# Patient Record
Sex: Female | Born: 1990 | Race: Black or African American | Hispanic: No | Marital: Single | Smoking: Former smoker
Health system: Southern US, Community
[De-identification: ages and names within clinical notes are randomized; demographics above are authoritative.]

## PROBLEM LIST (undated history)

## (undated) DIAGNOSIS — D649 Anemia, unspecified: Secondary | ICD-10-CM

## (undated) DIAGNOSIS — G4730 Sleep apnea, unspecified: Secondary | ICD-10-CM

## (undated) DIAGNOSIS — K219 Gastro-esophageal reflux disease without esophagitis: Secondary | ICD-10-CM

## (undated) DIAGNOSIS — K5090 Crohn's disease, unspecified, without complications: Secondary | ICD-10-CM

## (undated) HISTORY — PX: OTHER SURGICAL HISTORY: SHX169

## (undated) HISTORY — DX: Crohn's disease, unspecified, without complications: K50.90

## (undated) HISTORY — PX: APPENDECTOMY: SHX54

## (undated) HISTORY — PX: TONSILLECTOMY: SUR1361

---

## 2015-05-22 HISTORY — PX: EXPLORATORY LAPAROTOMY: SUR591

## 2015-07-22 HISTORY — PX: COLONOSCOPY: SHX174

## 2017-03-12 ENCOUNTER — Encounter: Payer: Self-pay | Admitting: Gastroenterology

## 2017-05-02 ENCOUNTER — Encounter: Payer: Self-pay | Admitting: Gastroenterology

## 2017-05-02 ENCOUNTER — Ambulatory Visit (INDEPENDENT_AMBULATORY_CARE_PROVIDER_SITE_OTHER): Payer: 59 | Admitting: Gastroenterology

## 2017-05-02 DIAGNOSIS — K5080 Crohn's disease of both small and large intestine without complications: Secondary | ICD-10-CM | POA: Diagnosis not present

## 2017-05-02 DIAGNOSIS — K5000 Crohn's disease of small intestine without complications: Secondary | ICD-10-CM | POA: Insufficient documentation

## 2017-05-02 NOTE — Progress Notes (Addendum)
REVIEWED-NO ADDITIONAL RECOMMENDATIONS.     Primary Care Physician:  Kirstie Peri, MD Primary Gastroenterologist:  Dr. Darrick Penna   Chief Complaint  Patient presents with  . Crohn's Disease    abd pain, diarrhea    HPI:   Shannon Bentley (would like to be called "[name]") is a 27 y.o. female presenting today at the request of Dr. Sherryll Burger to establish care. She underwent exploratory laparoscopy converted to open laparotomy, appendectomy, April 2017 by Dr Marcha Solders at [place]. Found to have large inflammatory mass at cecum, appendix unrecognizable in phlegmonous mass, right hemicolectomy performed with ileocolic anastamosis. path with crohn's disease involving both ileum and cecum. She established care with Dr. Teena Dunk. Underwent colonoscopy June 2017: evidence of prior surgical anastomosis, multiple biopsies. 2 small ulcers at surgical anastomosis. TI and remaining colon normal, without evidence of IBD. Path: acute colitis with reactive changes. Features of Crohn's disease not specifically identified. Inflammatory changes were non-specific. DDx includes infection, drug effects, self-limited colitis.    Was taking Imuran 75 BID, made her feel high. Stopped around November. States she was given some type of IV medication at [facility] (?Remicade) but made her feel awful. Didn't like taking this because it made her feel high. CT recently at [facility] around Nov/Dec where she was told she had some inflammation. Had been dealing with abdominal pain and gas from age [age] but was never able to figure out what was wrong. No rectal bleeding or diarrhea at that time. Not on Imuran any longer.   No rectal bleeding. No abdominal pain. No diarrhea. Denies NSAIDs.   CT Dec 2018: descending colon decompressed but suggestion of mild wall thickening, unable to exclude colitis.   Past Medical History:  Diagnosis Date  . Crohn's disease [facility])     Past Surgical History:  Procedure Laterality Date  . APPENDECTOMY    .  COLONOSCOPY  07/2015   Dr. Teena Dunk: evidence of prior surgical anastomosis, multiple biopsies. 2 small ulcers at anastomosis. TI and remaining colon normal, without evidence of IBD. Path with acute colitis with reactive changes, features of Crohn's not specifically identified. non-specific. differentials including infection, drug effects, self-limited colitis  . EXPLORATORY LAPAROTOMY  05/2015   Dr. 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 path with crohn's disease involving both ileum and cecum  . tonsils and adenoids      Current Outpatient Medications  Medication Sig Dispense Refill  . amoxicillin-clavulanate (AUGMENTIN) 500-125 MG tablet Take 1 tablet by mouth 2 (two) times daily.    . benzonatate (TESSALON) 100 MG capsule Take by mouth 3 (three) times daily.    . clobetasol cream (TEMOVATE) 0.05 % Apply 1 application topically 2 (two) times daily.    . drospirenone-ethinyl estradiol (YASMIN,ZARAH,SYEDA) 3-0.03 MG tablet Take 1 tablet by mouth daily.    Marland Kitchen esomeprazole (NEXIUM) 20 MG capsule Take 20 mg by mouth 2 (two) times daily.    . folic acid (FOLVITE) 800 MCG tablet Take 400 mcg by mouth daily.    . hydrocortisone cream 0.5 % Apply 1 application topically as needed for itching.    . levocetirizine (XYZAL) 5 MG tablet Take 2.5 mg by mouth every evening.    . Multiple Vitamins-Minerals (AIRBORNE PO) Take by mouth daily.    Marland Kitchen Phenyleph-Doxylamine-DM-APAP (ALKA-SELTZER PLS NIGHT CLD/FLU PO) Take 2 capsules by mouth every 4 (four) hours.    . Phenyleph-Doxylamine-DM-APAP (NYQUIL SEVERE COLD/FLU) 5-6.25-10-325 MG/15ML LIQD Take by mouth at bedtime.    Marland Kitchen  Prenatal Vit-Fe Fumarate-FA (PRENATAL VITAMIN PO) Take by mouth daily.     No current facility-administered medications for this visit.     Allergies as of 05/02/2017 - Review Complete 05/02/2017  Allergen Reaction Noted  . Bactrim  [sulfamethoxazole-trimethoprim] Hives 05/02/2017  . Benadryl [diphenhydramine] Hives 05/02/2017  . Dilaudid [hydromorphone hcl] Hives 05/02/2017    Family History  Problem Relation Age of Onset  . Diabetes Mother   . Hypertension Mother   . Diabetes Father   . Hypertension Father   . Colon cancer Neg Hx   . Colon polyps Neg Hx   . Inflammatory bowel disease Neg Hx     Social History   Socioeconomic History  . Marital status: Single    Spouse name: Not on file  . Number of children: Not on file  . Years of education: Not on file  . Highest education level: Not on file  Social Needs  . Financial resource strain: Not on file  . Food insecurity - worry: Not on file  . Food insecurity - inability: Not on file  . Transportation needs - medical: Not on file  . Transportation needs - non-medical: Not on file  Occupational History  . Not on file  Tobacco Use  . Smoking status: Former Smoker    Types: E-cigarettes  . Smokeless tobacco: Never Used  . Tobacco comment: vaped in past  Substance and Sexual Activity  . Alcohol use: Yes    Comment: rare  . Drug use: No  . Sexual activity: Yes    Birth control/protection: Pill  Other Topics Concern  . Not on file  Social History Narrative  . Not on file    Review of Systems: Gen: Denies any fever, chills, fatigue, weight loss, lack of appetite.  CV: Denies chest pain, heart palpitations, peripheral edema, syncope.  Resp: Denies shortness of breath at rest or with exertion. Denies wheezing or cough.  GI: see HPI  GU : Denies urinary burning, urinary frequency, urinary hesitancy MS: Denies joint pain, muscle weakness, cramps, or limitation of movement.  Derm: Denies rash, itching, dry skin Psych: Denies depression, anxiety, memory loss, and confusion Heme: Denies bruising, bleeding, and enlarged lymph nodes.  Physical Exam: BP 136/82   Pulse 97   Temp (!) 97.2 F (36.2 C) (Oral)   Ht 5\' 1"  (1.549 m)   Wt 259 lb 3.2 oz  (117.6 kg)   BMI 48.98 kg/m  General:   Alert and oriented. Pleasant and cooperative. Well-nourished and well-developed.  Head:  Normocephalic and atraumatic. Eyes:  Without icterus, sclera clear and conjunctiva pink.  Ears:  Normal auditory acuity. Nose:  No deformity, discharge,  or lesions. Mouth:  No deformity or lesions, oral mucosa pink.  Lungs:  Clear to auscultation bilaterally. No wheezes, rales, or rhonchi. No distress.  Heart:  S1, S2 present without murmurs appreciated.  Abdomen:  +BS, soft, non-tender and non-distended. No HSM noted. No guarding or rebound. No masses appreciated.  Rectal:  Deferred  Msk:  Symmetrical without gross deformities. Normal posture. Extremities:  Without  edema. Neurologic:  Alert and  oriented x4 Psych:  Alert and cooperative. Normal mood and affect.

## 2017-05-02 NOTE — Patient Instructions (Signed)
I am requesting all of the procedure notes and hospital records.   I will call you with the next step. Likely you will need an updated colonoscopy.  Further recommendations to follow!  It was a pleasure to see you today. I strive to create trusting relationships with patients to provide genuine, compassionate, and quality care. I value your feedback. If you receive a survey regarding your visit,  I greatly appreciate you taking time to fill this out.   Annitta Needs, PhD, ANP-BC [facility]

## 2017-05-07 ENCOUNTER — Telehealth: Payer: Self-pay | Admitting: Gastroenterology

## 2017-05-07 NOTE — Telephone Encounter (Signed)
Darl Pikes: I got some records from [facility], but I am missing the operative note from appendectomy and bowel resection, along with path. Can you have them send ASAP?

## 2017-05-08 NOTE — Telephone Encounter (Signed)
Requested records ASAP from [facility]

## 2017-05-09 ENCOUNTER — Encounter: Payer: Self-pay | Admitting: Gastroenterology

## 2017-05-09 ENCOUNTER — Telehealth: Payer: Self-pay | Admitting: Gastroenterology

## 2017-05-09 NOTE — Progress Notes (Signed)
cc'ed to pcp °

## 2017-05-09 NOTE — Telephone Encounter (Signed)
Tried to call pt, no answer, LMOVM. 

## 2017-05-09 NOTE — Telephone Encounter (Signed)
Please arrange colonoscopy with Propofol, Dr. Oneida Alar, due to history of ileocolonic Crohn's disease. Needs pregnancy test prior. Thanks!

## 2017-05-09 NOTE — Assessment & Plan Note (Signed)
27 year old female presenting to establish care. Underwent exploratory laparotomy with right hemicolectomy in April 2017 at [facility] by Dr. Marcha Solders, after CT findings of acute appendicitis. Path with Crohn's disease of both ileum and cecum. She underwent colonoscopy June 2017 by Dr. Teena Dunk that revealed 2 small ulcers at surgical anastomosis, not consistent with Crohn's disease and favoring differentials of infection, drug effect, self-limited colitis. From her report, she was prescribed Imuran and also was receiving IV therapy, (?Remicade). Both of these agents caused her to feel "high", so she is no longer on any therapy at all. Last dosing of Imuran in Nov 2018.   Clinically, she is entirely asymptomatic. Interestingly, she does note chronic abdominal pain from ages [age], now resolved s/p surgery. CT in Dec 2018 due to self-limiting diarrhea at [facility] with descending colon decompressed but suggestion of mild wall thickening, unable to exclude colitis.   To my knowledge, no prior CTE or capsule study. She is avoiding NSAIDs. As noted, entirely asymptomatic. As we have not pursued colonoscopy here, would recommend this to further evaluate her colon, distal small bowel. Likely ulcers at anastomosis were unrelated to prior Crohn's history. Remains off any agents for Crohn's disease and doing well. Unsure if CT findings from Dec 2018 were truly colitis.   Proceed with colonoscopy with Dr. Darrick Penna in the near future. The risks, benefits, and alternatives have been discussed in detail with the patient. They state understanding and desire to proceed.  Propofol

## 2017-05-10 ENCOUNTER — Telehealth: Payer: Self-pay | Admitting: Gastroenterology

## 2017-05-10 ENCOUNTER — Other Ambulatory Visit: Payer: Self-pay

## 2017-05-10 DIAGNOSIS — K5080 Crohn's disease of both small and large intestine without complications: Secondary | ICD-10-CM

## 2017-05-10 MED ORDER — PEG 3350-KCL-NA BICARB-NACL 420 G PO SOLR: 4000.0000 mL | ORAL | 0 refills | Status: DC

## 2017-05-10 NOTE — Telephone Encounter (Signed)
Tried to call pt, no answer, LMOVM. 

## 2017-05-10 NOTE — Telephone Encounter (Signed)
Tried to call pt to inform of pre-op appt 06/27/17 at 10:00am, no answer, LMOVM. Letter mailed with procedure instructions.

## 2017-05-10 NOTE — Telephone Encounter (Signed)
Pt called office, called her back. Ok to schedule TCS. TCS w/Propofol with [facility] scheduled for 07/03/17 at 8:30am. Rx for prep sent to pharmacy. Instructions mailed. Orders entered.

## 2017-05-10 NOTE — Telephone Encounter (Signed)
See other phone note

## 2017-05-10 NOTE — Telephone Encounter (Signed)
Pt was returning a call to MB. Please call her back at [phone]

## 2017-05-16 ENCOUNTER — Telehealth: Payer: Self-pay | Admitting: Gastroenterology

## 2017-05-16 NOTE — Telephone Encounter (Signed)
Pt said she doesn't have the prep instructions and asked if we could fax them to her pharmacy and she can get them when she picks up the prep. She is also wanting a note for her employer that she has crohns or IBS and has to go to the bathroom a lot.

## 2017-05-16 NOTE — Telephone Encounter (Signed)
Prep instructions faxed to pharmacy. Called and informed pt.   Routing to AB re: note for work.

## 2017-05-17 ENCOUNTER — Encounter: Payer: Self-pay | Admitting: Gastroenterology

## 2017-05-17 NOTE — Telephone Encounter (Signed)
Pt is aware and will come by to pick up the note.

## 2017-05-17 NOTE — Telephone Encounter (Signed)
I drafted a note and printed. When I saw her, she was doing well. I am not able to say that she has to go to the bathroom a lot, because when I saw her, she was doing well. I did state when she was diagnosed with Crohn's, that a colonoscopy was upcoming, and there may be times when she needs timely access to a bathroom.

## 2017-06-22 NOTE — Patient Instructions (Signed)
Shannon Bentley  06/22/2017     @PREFPERIOPPHARMACY @   Your procedure is scheduled on  07/03/2017.  Report to [facility] at  645  A.M.  Call this number if you have problems the morning of surgery:  [phone]   Remember:  Do not eat food or drink liquids after midnight.  Take these medicines the morning of surgery with A SIP OF WATER  nexium   Do not wear jewelry, make-up or nail polish.  Do not wear lotions, powders, or perfumes, or deodorant.  Do not shave 48 hours prior to surgery.  Men may shave face and neck.  Do not bring valuables to the hospital.  [facility] is not responsible for any belongings or valuables.  Contacts, dentures or bridgework may not be worn into surgery.  Leave your suitcase in the car.  After surgery it may be brought to your room.  For patients admitted to the hospital, discharge time will be determined by your treatment team.  Patients discharged the day of surgery will not be allowed to drive home.   Name and phone number of your driver:   family Special instructions:  Follow the diet and prep instructions given to you by Dr Evelina Dun office.  Please read over the following fact sheets that you were given. Anesthesia Post-op Instructions and Care and Recovery After Surgery       Colonoscopy, Adult A colonoscopy is an exam to look at the large intestine. It is done to check for problems, such as:  Lumps (tumors).  Growths (polyps).  Swelling (inflammation).  Bleeding.  What happens before the procedure? Eating and drinking Follow instructions from your doctor about eating and drinking. These instructions may include:  A few days before the procedure - follow a low-fiber diet. ? Avoid nuts. ? Avoid seeds. ? Avoid dried fruit. ? Avoid raw fruits. ? Avoid vegetables.  1-3 days before the procedure - follow a clear liquid diet. Avoid liquids that have red or purple dye. Drink only clear liquids, such as: ? Clear  broth or bouillon. ? Black coffee or tea. ? Clear juice. ? Clear soft drinks or sports drinks. ? Gelatin dessert. ? Popsicles.  On the day of the procedure - do not eat or drink anything during the 2 hours before the procedure.  Bowel prep If you were prescribed an oral bowel prep:  Take it as told by your doctor. Starting the day before your procedure, you will need to drink a lot of liquid. The liquid will cause you to poop (have bowel movements) until your poop is almost clear or light green.  If your skin or butt gets irritated from diarrhea, you may: ? Wipe the area with wipes that have medicine in them, such as adult wet wipes with aloe and vitamin E. ? Put something on your skin that soothes the area, such as petroleum jelly.  If you throw up (vomit) while drinking the bowel prep, take a break for up to 60 minutes. Then begin the bowel prep again. If you keep throwing up and you cannot take the bowel prep without throwing up, call your doctor.  General instructions  Ask your doctor about changing or stopping your normal medicines. This is important if you take diabetes medicines or blood thinners.  Plan to have someone take you home from the hospital or clinic. What happens during the procedure?  An IV tube may be put into one  of your veins.  You will be given medicine to help you relax (sedative).  To reduce your risk of infection: ? Your doctors will wash their hands. ? Your anal area will be washed with soap.  You will be asked to lie on your side with your knees bent.  Your doctor will get a long, thin, flexible tube ready. The tube will have a camera and a light on the end.  The tube will be put into your anus.  The tube will be gently put into your large intestine.  Air will be delivered into your large intestine to keep it open. You may feel some pressure or cramping.  The camera will be used to take photos.  A small tissue sample may be removed from your  body to be looked at under a microscope (biopsy). If any possible problems are found, the tissue will be sent to a lab for testing.  If small growths are found, your doctor may remove them and have them checked for cancer.  The tube that was put into your anus will be slowly removed. The procedure may vary among doctors and hospitals. What happens after the procedure?  Your doctor will check on you often until the medicines you were given have worn off.  Do not drive for 24 hours after the procedure.  You may have a small amount of blood in your poop.  You may pass gas.  You may have mild cramps or bloating in your belly (abdomen).  It is up to you to get the results of your procedure. Ask your doctor, or the department performing the procedure, when your results will be ready. This information is not intended to replace advice given to you by your health care provider. Make sure you discuss any questions you have with your health care provider. Document Released: 03/11/2010 Document Revised: 12/08/2015 Document Reviewed: 04/20/2015 Elsevier Interactive Patient Education  2017 [employer]  Colonoscopy, Adult, Care After This sheet gives you information about how to care for yourself after your procedure. Your health care provider may also give you more specific instructions. If you have problems or questions, contact your health care provider. What can I expect after the procedure? After the procedure, it is common to have:  A small amount of blood in your stool for 24 hours after the procedure.  Some gas.  Mild abdominal cramping or bloating.  Follow these instructions at home: General instructions   For the first 24 hours after the procedure: ? Do not drive or use machinery. ? Do not sign important documents. ? Do not drink alcohol. ? Do your regular daily activities at a slower pace than normal. ? Eat soft, easy-to-digest foods. ? Rest often.  Take over-the-counter  or prescription medicines only as told by your health care provider.  It is up to you to get the results of your procedure. Ask your health care provider, or the department performing the procedure, when your results will be ready. Relieving cramping and bloating  Try walking around when you have cramps or feel bloated.  Apply heat to your abdomen as told by your health care provider. Use a heat source that your health care provider recommends, such as a moist heat pack or a heating pad. ? Place a towel between your skin and the heat source. ? Leave the heat on for 20-30 minutes. ? Remove the heat if your skin turns bright red. This is especially important if you are unable to feel  pain, heat, or cold. You may have a greater risk of getting burned. Eating and drinking  Drink enough fluid to keep your urine clear or pale yellow.  Resume your normal diet as instructed by your health care provider. Avoid heavy or fried foods that are hard to digest.  Avoid drinking alcohol for as long as instructed by your health care provider. Contact a health care provider if:  You have blood in your stool 2-3 days after the procedure. Get help right away if:  You have more than a small spotting of blood in your stool.  You pass large blood clots in your stool.  Your abdomen is swollen.  You have nausea or vomiting.  You have a fever.  You have increasing abdominal pain that is not relieved with medicine. This information is not intended to replace advice given to you by your health care provider. Make sure you discuss any questions you have with your health care provider. Document Released: 09/21/2003 Document Revised: 11/01/2015 Document Reviewed: 04/20/2015 Elsevier Interactive Patient Education  2018 [place] Anesthesia is a term that refers to techniques, procedures, and medicines that help a person stay safe and comfortable during a medical procedure.  Monitored anesthesia care, or sedation, is one type of anesthesia. Your anesthesia specialist may recommend sedation if you will be having a procedure that does not require you to be unconscious, such as:  Cataract surgery.  A dental procedure.  A biopsy.  A colonoscopy.  During the procedure, you may receive a medicine to help you relax (sedative). There are three levels of sedation:  Mild sedation. At this level, you may feel awake and relaxed. You will be able to follow directions.  Moderate sedation. At this level, you will be sleepy. You may not remember the procedure.  Deep sedation. At this level, you will be asleep. You will not remember the procedure.  The more medicine you are given, the deeper your level of sedation will be. Depending on how you respond to the procedure, the anesthesia specialist may change your level of sedation or the type of anesthesia to fit your needs. An anesthesia specialist will monitor you closely during the procedure. Let your health care provider know about:  Any allergies you have.  All medicines you are taking, including vitamins, herbs, eye drops, creams, and over-the-counter medicines.  Any use of steroids (by mouth or as a cream).  Any problems you or family members have had with sedatives and anesthetic medicines.  Any blood disorders you have.  Any surgeries you have had.  Any medical conditions you have, such as sleep apnea.  Whether you are pregnant or may be pregnant.  Any use of cigarettes, alcohol, or street drugs. What are the risks? Generally, this is a safe procedure. However, problems may occur, including:  Getting too much medicine (oversedation).  Nausea.  Allergic reaction to medicines.  Trouble breathing. If this happens, a breathing tube may be used to help with breathing. It will be removed when you are awake and breathing on your own.  Heart trouble.  Lung trouble.  Before the procedure Staying  hydrated Follow instructions from your health care provider about hydration, which may include:  Up to 2 hours before the procedure - you may continue to drink clear liquids, such as water, clear fruit juice, black coffee, and plain tea.  Eating and drinking restrictions Follow instructions from your health care provider about eating and drinking, which may include:  8 hours before the procedure - stop eating heavy meals or foods such as meat, fried foods, or fatty foods.  6 hours before the procedure - stop eating light meals or foods, such as toast or cereal.  6 hours before the procedure - stop drinking milk or drinks that contain milk.  2 hours before the procedure - stop drinking clear liquids.  Medicines Ask your health care provider about:  Changing or stopping your regular medicines. This is especially important if you are taking diabetes medicines or blood thinners.  Taking medicines such as aspirin and ibuprofen. These medicines can thin your blood. Do not take these medicines before your procedure if your health care provider instructs you not to.  Tests and exams  You will have a physical exam.  You may have blood tests done to show: ? How well your kidneys and liver are working. ? How well your blood can clot.  General instructions  Plan to have someone take you home from the hospital or clinic.  If you will be going home right after the procedure, plan to have someone with you for 24 hours.  What happens during the procedure?  Your blood pressure, heart rate, breathing, level of pain and overall condition will be monitored.  An IV tube will be inserted into one of your veins.  Your anesthesia specialist will give you medicines as needed to keep you comfortable during the procedure. This may mean changing the level of sedation.  The procedure will be performed. After the procedure  Your blood pressure, heart rate, breathing rate, and blood oxygen level  will be monitored until the medicines you were given have worn off.  Do not drive for 24 hours if you received a sedative.  You may: ? Feel sleepy, clumsy, or nauseous. ? Feel forgetful about what happened after the procedure. ? Have a sore throat if you had a breathing tube during the procedure. ? Vomit. This information is not intended to replace advice given to you by your health care provider. Make sure you discuss any questions you have with your health care provider. Document Released: 11/02/2004 Document Revised: 07/16/2015 Document Reviewed: 05/30/2015 Elsevier Interactive Patient Education  2018 [place], Care After These instructions provide you with information about caring for yourself after your procedure. Your health care provider may also give you more specific instructions. Your treatment has been planned according to current medical practices, but problems sometimes occur. Call your health care provider if you have any problems or questions after your procedure. What can I expect after the procedure? After your procedure, it is common to:  Feel sleepy for several hours.  Feel clumsy and have poor balance for several hours.  Feel forgetful about what happened after the procedure.  Have poor judgment for several hours.  Feel nauseous or vomit.  Have a sore throat if you had a breathing tube during the procedure.  Follow these instructions at home: For at least 24 hours after the procedure:   Do not: ? Participate in activities in which you could fall or become injured. ? Drive. ? Use heavy machinery. ? Drink alcohol. ? Take sleeping pills or medicines that cause drowsiness. ? Make important decisions or sign legal documents. ? Take care of children on your own.  Rest. Eating and drinking  Follow the diet that is recommended by your health care provider.  If you vomit, drink water, juice, or soup when you can drink without  vomiting.  Make sure you have little or no nausea before eating solid foods. General instructions  Have a responsible adult stay with you until you are awake and alert.  Take over-the-counter and prescription medicines only as told by your health care provider.  If you smoke, do not smoke without supervision.  Keep all follow-up visits as told by your health care provider. This is important. Contact a health care provider if:  You keep feeling nauseous or you keep vomiting.  You feel light-headed.  You develop a rash.  You have a fever. Get help right away if:  You have trouble breathing. This information is not intended to replace advice given to you by your health care provider. Make sure you discuss any questions you have with your health care provider. Document Released: 05/30/2015 Document Revised: 09/29/2015 Document Reviewed: 05/30/2015 Elsevier Interactive Patient Education  [employer].

## 2017-06-27 ENCOUNTER — Encounter (HOSPITAL_COMMUNITY): Payer: Self-pay

## 2017-06-27 ENCOUNTER — Other Ambulatory Visit: Payer: Self-pay

## 2017-06-27 ENCOUNTER — Encounter (HOSPITAL_COMMUNITY)
Admission: RE | Admit: 2017-06-27 | Discharge: 2017-06-27 | Disposition: A | Discharge status: Home / Self Care | Payer: 59 | Source: Ambulatory Visit | Attending: Gastroenterology | Admitting: Gastroenterology

## 2017-06-27 DIAGNOSIS — Z01812 Encounter for preprocedural laboratory examination: Secondary | ICD-10-CM | POA: Insufficient documentation

## 2017-06-27 HISTORY — DX: Anemia, unspecified: D64.9

## 2017-06-27 HISTORY — DX: Sleep apnea, unspecified: G47.30

## 2017-06-27 HISTORY — DX: Gastro-esophageal reflux disease without esophagitis: K21.9

## 2017-06-27 LAB — CBC WITH DIFFERENTIAL/PLATELET
BASOS ABS: 0 10*3/uL (ref 0.0–0.1)
BASOS PCT: 0 %
Eosinophils Absolute: 0.1 10*3/uL (ref 0.0–0.7)
Eosinophils Relative: 1 %
HCT: 40.9 % (ref 36.0–46.0)
HEMOGLOBIN: 12.7 g/dL (ref 12.0–15.0)
Lymphocytes Relative: 25 %
Lymphs Abs: 2.4 10*3/uL (ref 0.7–4.0)
MCH: 26.2 pg (ref 26.0–34.0)
MCHC: 31.1 g/dL (ref 30.0–36.0)
MCV: 84.5 fL (ref 78.0–100.0)
MONOS PCT: 5 %
Monocytes Absolute: 0.5 10*3/uL (ref 0.1–1.0)
NEUTROS PCT: 69 %
Neutro Abs: 6.6 10*3/uL (ref 1.7–7.7)
Platelets: 364 10*3/uL (ref 150–400)
RBC: 4.84 MIL/uL (ref 3.87–5.11)
RDW: 14.1 % (ref 11.5–15.5)
WBC: 9.6 10*3/uL (ref 4.0–10.5)

## 2017-06-27 LAB — HCG, SERUM, QUALITATIVE: PREG SERUM: NEGATIVE

## 2017-06-29 NOTE — Progress Notes (Signed)
CC'D TO PCP °

## 2017-07-03 ENCOUNTER — Encounter (HOSPITAL_COMMUNITY)
Admission: RE | Disposition: A | Discharge status: Home / Self Care | Payer: Self-pay | Source: Ambulatory Visit | Attending: Gastroenterology

## 2017-07-03 ENCOUNTER — Encounter (HOSPITAL_COMMUNITY): Payer: Self-pay | Admitting: *Deleted

## 2017-07-03 ENCOUNTER — Ambulatory Visit (HOSPITAL_COMMUNITY): Payer: 59 | Admitting: Anesthesiology

## 2017-07-03 ENCOUNTER — Ambulatory Visit (HOSPITAL_COMMUNITY)
Admission: RE | Admit: 2017-07-03 | Discharge: 2017-07-03 | Disposition: A | Discharge status: Home / Self Care | Payer: 59 | Source: Ambulatory Visit | Attending: Gastroenterology | Admitting: Gastroenterology

## 2017-07-03 DIAGNOSIS — K644 Residual hemorrhoidal skin tags: Secondary | ICD-10-CM | POA: Insufficient documentation

## 2017-07-03 DIAGNOSIS — G4730 Sleep apnea, unspecified: Secondary | ICD-10-CM | POA: Diagnosis not present

## 2017-07-03 DIAGNOSIS — Z87891 Personal history of nicotine dependence: Secondary | ICD-10-CM | POA: Insufficient documentation

## 2017-07-03 DIAGNOSIS — R197 Diarrhea, unspecified: Secondary | ICD-10-CM

## 2017-07-03 DIAGNOSIS — K5080 Crohn's disease of both small and large intestine without complications: Secondary | ICD-10-CM | POA: Insufficient documentation

## 2017-07-03 DIAGNOSIS — Z79899 Other long term (current) drug therapy: Secondary | ICD-10-CM | POA: Diagnosis not present

## 2017-07-03 DIAGNOSIS — K648 Other hemorrhoids: Secondary | ICD-10-CM | POA: Insufficient documentation

## 2017-07-03 HISTORY — PX: BIOPSY: SHX5522

## 2017-07-03 HISTORY — PX: COLONOSCOPY WITH PROPOFOL: SHX5780

## 2017-07-03 SURGERY — COLONOSCOPY WITH PROPOFOL
Anesthesia: Monitor Anesthesia Care

## 2017-07-03 MED ORDER — CHLORHEXIDINE GLUCONATE CLOTH 2 % EX PADS: 6.0000 | MEDICATED_PAD | Freq: Once | CUTANEOUS | Status: DC

## 2017-07-03 MED ORDER — MIDAZOLAM HCL 2 MG/2ML IJ SOLN
INTRAMUSCULAR | Status: AC
  Filled 2017-07-03: qty 2

## 2017-07-03 MED ORDER — PROPOFOL 10 MG/ML IV BOLUS
INTRAVENOUS | Status: AC
  Filled 2017-07-03: qty 80

## 2017-07-03 MED ORDER — LACTATED RINGERS IV SOLN
INTRAVENOUS | Status: DC
  Administered 2017-07-03: 1000 mL via INTRAVENOUS

## 2017-07-03 MED ORDER — LIDOCAINE HCL (PF) 1 % IJ SOLN
INTRAMUSCULAR | Status: AC
  Filled 2017-07-03: qty 10

## 2017-07-03 MED ORDER — LIDOCAINE HCL (CARDIAC) PF 100 MG/5ML IV SOSY
PREFILLED_SYRINGE | INTRAVENOUS | Status: DC | PRN
  Administered 2017-07-03: 40 mg via INTRAVENOUS

## 2017-07-03 MED ORDER — MIDAZOLAM HCL 2 MG/2ML IJ SOLN
INTRAMUSCULAR | Status: DC | PRN
  Administered 2017-07-03: 2 mg via INTRAVENOUS

## 2017-07-03 MED ORDER — PROPOFOL 500 MG/50ML IV EMUL
INTRAVENOUS | Status: DC | PRN
  Administered 2017-07-03: 08:00:00 via INTRAVENOUS
  Administered 2017-07-03: 200 ug/kg/min via INTRAVENOUS

## 2017-07-03 MED ORDER — SUCCINYLCHOLINE CHLORIDE 20 MG/ML IJ SOLN
INTRAMUSCULAR | Status: AC
  Filled 2017-07-03: qty 1

## 2017-07-03 NOTE — Transfer of Care (Signed)
Immediate Anesthesia Transfer of Care Note  Patient: Shannon Bentley  Procedure(s) Performed: COLONOSCOPY WITH PROPOFOL (N/A ) BIOPSY  Patient Location: PACU  Anesthesia Type:MAC  Level of Consciousness: awake, oriented and patient cooperative  Airway & Oxygen Therapy: Patient Spontanous Breathing  Post-op Assessment: Report given to RN and Post -op Vital signs reviewed and stable  Post vital signs: Reviewed and stable  Last Vitals:  Vitals Value Taken Time  BP    Temp    Pulse    Resp    SpO2      Last Pain:  Vitals:   07/03/17 0735  TempSrc:   PainSc: 0-No pain      Patients Stated Pain Goal: 7 (03/54/65 6812)  Complications: No apparent anesthesia complications

## 2017-07-03 NOTE — Op Note (Signed)
[facility] Patient Name: Shannon Bentley Procedure Date: 07/03/2017 7:35 AM MRN: [id number] Date of Birth: 1990-03-28 Attending MD: Jonette Eva MD, MD CSN: [id number] Age: 27 Admit Type: Outpatient Procedure:                Colonoscopy WITH COLD FORCEP SBIOPSY Indications:              Exclusion of Crohn's disease Providers:                Jonette Eva MD, MD, Loma Messing B. Patsy Lager, RN, Edythe Clarity, Technician Referring MD:             Carmelina Peal. Sherryll Burger MD, MD Medicines:                Propofol per Anesthesia Complications:            No immediate complications. Estimated Blood Loss:     Estimated blood loss was minimal. Procedure:                Pre-Anesthesia Assessment:                           - Prior to the procedure, a History and Physical                            was performed, and patient medications and                            allergies were reviewed. The patient's tolerance of                            previous anesthesia was also reviewed. The risks                            and benefits of the procedure and the sedation                            options and risks were discussed with the patient.                            All questions were answered, and informed consent                            was obtained. Prior Anticoagulants: The patient has                            taken no previous anticoagulant or antiplatelet                            agents. ASA Grade Assessment: II - A patient with                            mild systemic disease. After reviewing the risks  and benefits, the patient was deemed in                            satisfactory condition to undergo the procedure.                            After obtaining informed consent, the colonoscope                            was passed under direct vision. Throughout the                            procedure, the patient's blood pressure, pulse, and                             oxygen saturations were monitored continuously. The                            EC-[id number] ([id number]) scope was introduced through                            the anus and advanced to the 10 cm into the ileum.                            The colonoscopy was somewhat difficult due to                            inadequate bowel prep and a tortuous colon.                            Successful completion of the procedure was aided by                            lavage and COLOWRAP. The patient tolerated the                            procedure well. The quality of the bowel                            preparation was excellent except the rectum was                            good, the sigmoid colon was good, the descending                            colon was good, the splenic flexure was poor, the                            transverse colon was good and the ileum was good.                            The terminal ileum and the rectum were photographed. Scope In: 7:41:41 AM Scope Out: 7:55:47  AM Scope Withdrawal Time: 0 hours 12 minutes 16 seconds  Total Procedure Duration: 0 hours 14 minutes 6 seconds  Findings:      The neo-terminal ileum appeared normal. This was biopsied with a cold       forceps for histology(BTL 1).      The recto-sigmoid colon, sigmoid colon, descending colon and transverse       colon appeared normal. Biopsies were taken with a cold forceps for       histology(BTL 2).      The rectum appeared normal. Biopsies were taken with a cold forceps for       histology(BTL 3).      External and internal hemorrhoids were found during retroflexion. The       hemorrhoids were small.      The recto-sigmoid colon and sigmoid colon were mildly tortuous. Impression:               - The recto-sigmoid colon, sigmoid colon,                            descending colon and transverse colon are normal.                            Biopsied.                           -  The rectum is normal. Biopsied.                           - External and internal hemorrhoids. Moderate Sedation:      Per Anesthesia Care Recommendation:           - Repeat colonoscopy for surveillance based on                            pathology results.                           - High fiber diet.                           - Continue present medications.                           - Await pathology results.                           - Return to my office in 4 months.                           - Patient has a contact number available for                            emergencies. The signs and symptoms of potential                            delayed complications were discussed with the  patient. Return to normal activities tomorrow.                            Written discharge instructions were provided to the                            patient. Procedure Code(s):        --- Professional ---                           [phone], Colonoscopy, flexible; with biopsy, single                            or multiple Diagnosis Code(s):        --- Professional ---                           K64.8, Other hemorrhoids CPT copyright 2017 American Medical Association. All rights reserved. The codes documented in this report are preliminary and upon coder review may  be revised to meet current compliance requirements. Jonette Eva, MD Jonette Eva MD, MD 07/03/2017 8:07:56 AM This report has been signed electronically. Number of Addenda: 0

## 2017-07-03 NOTE — Addendum Note (Signed)
Addendum  created 07/03/17 1330 by Earleen Newport, CRNA   Charge Capture section accepted

## 2017-07-03 NOTE — Anesthesia Postprocedure Evaluation (Signed)
Anesthesia Post Note  Patient: Shannon Bentley  Procedure(s) Performed: COLONOSCOPY WITH PROPOFOL (N/A ) BIOPSY     Anesthesia Post Evaluation  Last Vitals:  Vitals:   07/03/17 0645  BP: 110/69  Resp: (!) 25  Temp: 36.7 C  SpO2: 98%    Last Pain:  Vitals:   07/03/17 0735  TempSrc:   PainSc: 0-No pain   Pain Goal: Patients Stated Pain Goal: 7 (07/03/17 0645)               ADAMS, AMY A

## 2017-07-03 NOTE — Anesthesia Procedure Notes (Signed)
Procedure Name: MAC Date/Time: 07/03/2017 7:31 AM Performed by: Andree Elk Amy A, CRNA Pre-anesthesia Checklist: Patient identified, Emergency Drugs available, Suction available, Patient being monitored and Timeout performed Oxygen Delivery Method: Nasal cannula

## 2017-07-03 NOTE — Addendum Note (Signed)
Addendum  created 07/03/17 1320 by Savaya Hakes A, CRNA   Charge Capture section accepted    

## 2017-07-03 NOTE — Discharge Instructions (Addendum)
You have small internal  AND EXTERNAL hemorrhoids. YOU DID NOT HAVE ANY POLYPS.  I BIOPSIED YOUR SMALL BOWEL, COLON, AND RECTUM.   DRINK WATER TO KEEP YOUR URINE LIGHT YELLOW.  CONTINUE YOUR WEIGHT LOSS EFFORTS. YOUR BODY MASS INDEX IS 45, WHICH MEANS YOU ARE MORBIDLY OBESE AND IT PUTS YOU AT INCREASED RISK FOR CIRRHOSIS AND COLON CANCER, AND COMPLICATIONS FROM MEDICAL PROCEDURES. A WEIGHT OF 210 LBS   WILL GET YOUR BODY MASS INDEX(BMI) UNDER 40. ULTIMATELY YOU NEED TO GET YOUR WEIGHT UNDER A BMI OF 30. A WEIGHT OF  155 LBS  WILL GET YOUR BODY MASS INDEX(BMI) UNDER 30. PLEASE LET ME KNOW IF YOU WOULD LIKE A REFERRAL FOR WEIGHT LOSS SURGERY.  STRICTLY FOLLOW A HIGH FIBER/LOW FAT DIET. AVOID ITEMS THAT CAUSE BLOATING. MEATS SHOULD BE BAKED, BROILED, OR BOILED. AVOID FRIED FOODS.SEE INFO BELOW.   CHEW ONE TUMS WITH MEALS THREE TIMES A DAY TO PREVENT DIARRHEA AFTER EATING.  USE PREPARATION H FOUR TIMES  A DAY IF NEEDED TO RELIEVE RECTAL PAIN/PRESSURE/BLEEDING.   YOUR BIOPSY WILL BE BACK IN 7 DAYS.  FOLLOW UP IN 4 MOS.     Colonoscopy Care After Read the instructions outlined below and refer to this sheet in the next week. These discharge instructions provide you with general information on caring for yourself after you leave the hospital. While your treatment has been planned according to the most current medical practices available, unavoidable complications occasionally occur. If you have any problems or questions after discharge, call DR. Karanveer Ramakrishnan, [phone].  ACTIVITY  You may resume your regular activity, but move at a slower pace for the next 24 hours.   Take frequent rest periods for the next 24 hours.   Walking will help get rid of the air and reduce the bloated feeling in your belly (abdomen).   No driving for 24 hours (because of the medicine (anesthesia) used during the test).   You may shower.   Do not sign any important legal documents or operate any machinery for 24  hours (because of the anesthesia used during the test).    NUTRITION  Drink plenty of fluids.   You may resume your normal diet as instructed by your doctor.   Begin with a light meal and progress to your normal diet. Heavy or fried foods are harder to digest and may make you feel sick to your stomach (nauseated).   Avoid alcoholic beverages for 24 hours or as instructed.    MEDICATIONS  You may resume your normal medications.   WHAT YOU CAN EXPECT TODAY  Some feelings of bloating in the abdomen.   Passage of more gas than usual.   Spotting of blood in your stool or on the toilet paper  .  IF YOU HAD POLYPS REMOVED DURING THE COLONOSCOPY:  Eat a soft diet IF YOU HAVE NAUSEA, BLOATING, ABDOMINAL PAIN, OR VOMITING.    FINDING OUT THE RESULTS OF YOUR TEST Not all test results are available during your visit. DR. Darrick Penna WILL CALL YOU WITHIN 14 DAYS OF YOUR PROCEDUE WITH YOUR RESULTS. Do not assume everything is normal if you have not heard from DR. Othell Diluzio, CALL HER OFFICE AT [phone].  SEEK IMMEDIATE MEDICAL ATTENTION AND CALL THE OFFICE: [phone] IF:  You have more than a spotting of blood in your stool.   Your belly is swollen (abdominal distention).   You are nauseated or vomiting.   You have a temperature over 101F.   You have abdominal  pain or discomfort that is severe or gets worse throughout the day.   Low-Fat Diet BREADS, CEREALS, PASTA, RICE, DRIED PEAS, AND BEANS These products are high in carbohydrates and most are low in fat. Therefore, they can be increased in the diet as substitutes for fatty foods. They too, however, contain calories and should not be eaten in excess. Cereals can be eaten for snacks as well as for breakfast.   FRUITS AND VEGETABLES It is good to eat fruits and vegetables. Besides being sources of fiber, both are rich in vitamins and some minerals. They help you get the daily allowances of these nutrients. Fruits and vegetables  can be used for snacks and desserts.  MEATS Limit lean meat, chicken, [place], and fish to no more than 6 ounces per day. Beef, Pork, and Lamb Use lean cuts of beef, pork, and lamb. Lean cuts include:  Extra-lean ground beef.  Arm roast.  Sirloin tip.  Center-cut ham.  Round steak.  Loin chops.  Rump roast.  Tenderloin.  Trim all fat off the outside of meats before cooking. It is not necessary to severely decrease the intake of red meat, but lean choices should be made. Lean meat is rich in protein and contains a highly absorbable form of iron. Premenopausal women, in particular, should avoid reducing lean red meat because this could increase the risk for low red blood cells (iron-deficiency anemia).  Chicken and [place] These are good sources of protein. The fat of poultry can be reduced by removing the skin and underlying fat layers before cooking. Chicken and [place] can be substituted for lean red meat in the diet. Poultry should not be fried or covered with high-fat sauces. Fish and Shellfish Fish is a good source of protein. Shellfish contain cholesterol, but they usually are low in saturated fatty acids. The preparation of fish is important. Like chicken and [place], they should not be fried or covered with high-fat sauces. EGGS Egg whites contain no fat or cholesterol. They can be eaten often. Try 1 to 2 egg whites instead of whole eggs in recipes or use egg substitutes that do not contain yolk. MILK AND DAIRY PRODUCTS Use skim or 1% milk instead of 2% or whole milk. Decrease whole milk, natural, and processed cheeses. Use nonfat or low-fat (2%) cottage cheese or low-fat cheeses made from vegetable oils. Choose nonfat or low-fat (1 to 2%) yogurt. Experiment with evaporated skim milk in recipes that call for heavy cream. Substitute low-fat yogurt or low-fat cottage cheese for sour cream in dips and salad dressings. Have at least 2 servings of low-fat dairy products, such as 2 glasses of  skim (or 1%) milk each day to help get your daily calcium intake. FATS AND OILS Reduce the total intake of fats, especially saturated fat. Butterfat, lard, and beef fats are high in saturated fat and cholesterol. These should be avoided as much as possible. Vegetable fats do not contain cholesterol, but certain vegetable fats, such as coconut oil, palm oil, and palm kernel oil are very high in saturated fats. These should be limited. These fats are often used in bakery goods, processed foods, popcorn, oils, and nondairy creamers. Vegetable shortenings and some peanut butters contain hydrogenated oils, which are also saturated fats. Read the labels on these foods and check for saturated vegetable oils. Unsaturated vegetable oils and fats do not raise blood cholesterol. However, they should be limited because they are fats and are high in calories. Total fat should still be limited to  30% of your daily caloric intake. Desirable liquid vegetable oils are corn oil, cottonseed oil, olive oil, canola oil, safflower oil, soybean oil, and sunflower oil. Peanut oil is not as good, but small amounts are acceptable. Buy a heart-healthy tub margarine that has no partially hydrogenated oils in the ingredients. Mayonnaise and salad dressings often are made from unsaturated fats, but they should also be limited because of their high calorie and fat content. Seeds, nuts, peanut butter, olives, and avocados are high in fat, but the fat is mainly the unsaturated type. These foods should be limited mainly to avoid excess calories and fat. OTHER EATING TIPS Snacks  Most sweets should be limited as snacks. They tend to be rich in calories and fats, and their caloric content outweighs their nutritional value. Some good choices in snacks are graham crackers, melba toast, soda crackers, bagels (no egg), English muffins, fruits, and vegetables. These snacks are preferable to snack crackers, [place] fries, TORTILLA CHIPS, and POTATO  chips. Popcorn should be air-popped or cooked in small amounts of liquid vegetable oil. Desserts Eat fruit, low-fat yogurt, and fruit ices instead of pastries, cake, and cookies. Sherbet, angel food cake, gelatin dessert, frozen low-fat yogurt, or other frozen products that do not contain saturated fat (pure fruit juice bars, frozen ice pops) are also acceptable.  COOKING METHODS Choose those methods that use little or no fat. They include: Poaching.  Braising.  Steaming.  Grilling.  Baking.  Stir-frying.  Broiling.  Microwaving.  Foods can be cooked in a nonstick pan without added fat, or use a nonfat cooking spray in regular cookware. Limit fried foods and avoid frying in saturated fat. Add moisture to lean meats by using water, broth, cooking wines, and other nonfat or low-fat sauces along with the cooking methods mentioned above. Soups and stews should be chilled after cooking. The fat that forms on top after a few hours in the refrigerator should be skimmed off. When preparing meals, avoid using excess salt. Salt can contribute to raising blood pressure in some people.  EATING AWAY FROM HOME Order entres, potatoes, and vegetables without sauces or butter. When meat exceeds the size of a deck of cards (3 to 4 ounces), the rest can be taken home for another meal. Choose vegetable or fruit salads and ask for low-calorie salad dressings to be served on the side. Use dressings sparingly. Limit high-fat toppings, such as bacon, crumbled eggs, cheese, sunflower seeds, and olives. Ask for heart-healthy tub margarine instead of butter.   High-Fiber Diet A high-fiber diet changes your normal diet to include more whole grains, legumes, fruits, and vegetables. Changes in the diet involve replacing refined carbohydrates with unrefined foods. The calorie level of the diet is essentially unchanged. The Dietary Reference Intake (recommended amount) for adult males is 38 grams per day. For adult females,  it is 25 grams per day. Pregnant and lactating women should consume 28 grams of fiber per day. Fiber is the intact part of a plant that is not broken down during digestion. Functional fiber is fiber that has been isolated from the plant to provide a beneficial effect in the body. PURPOSE  Increase stool bulk.   Ease and regulate bowel movements.   Lower cholesterol.   REDUCE RISK OF COLON CANCER  INDICATIONS THAT YOU NEED MORE FIBER  Constipation and hemorrhoids.   Uncomplicated diverticulosis (intestine condition) and irritable bowel syndrome.   Weight management.   As a protective measure against hardening of the arteries (atherosclerosis), diabetes,  and cancer.   GUIDELINES FOR INCREASING FIBER IN THE DIET  Start adding fiber to the diet slowly. A gradual increase of about 5 more grams (2 slices of whole-wheat bread, 2 servings of most fruits or vegetables, or 1 bowl of high-fiber cereal) per day is best. Too rapid an increase in fiber may result in constipation, flatulence, and bloating.   Drink enough water and fluids to keep your urine clear or pale yellow. Water, juice, or caffeine-free drinks are recommended. Not drinking enough fluid may cause constipation.   Eat a variety of high-fiber foods rather than one type of fiber.   Try to increase your intake of fiber through using high-fiber foods rather than fiber pills or supplements that contain small amounts of fiber.   The goal is to change the types of food eaten. Do not supplement your present diet with high-fiber foods, but replace foods in your present diet.    INCLUDE A VARIETY OF FIBER SOURCES  Replace refined and processed grains with whole grains, canned fruits with fresh fruits, and incorporate other fiber sources. White rice, white breads, and most bakery goods contain little or no fiber.   Brown whole-grain rice, buckwheat oats, and many fruits and vegetables are all good sources of fiber. These include:  broccoli, Brussels sprouts, cabbage, cauliflower, beets, sweet potatoes, white potatoes (skin on), carrots, tomatoes, eggplant, squash, berries, fresh fruits, and dried fruits.   Cereals appear to be the richest source of fiber. Cereal fiber is found in whole grains and bran. Bran is the fiber-rich outer coat of cereal grain, which is largely removed in refining. In whole-grain cereals, the bran remains. In breakfast cereals, the largest amount of fiber is found in those with "bran" in their names. The fiber content is sometimes indicated on the label.   You may need to include additional fruits and vegetables each day.   In baking, for 1 cup white flour, you may use the following substitutions:   1 cup whole-wheat flour minus 2 tablespoons.   1/2 cup white flour plus 1/2 cup whole-wheat flour.   Hemorrhoids Hemorrhoids are dilated (enlarged) veins around the rectum. Sometimes clots will form in the veins. This makes them swollen and painful. These are called thrombosed hemorrhoids. Causes of hemorrhoids include:  Constipation.   Straining to have a bowel movement.   HEAVY LIFTING  HOME CARE INSTRUCTIONS  Eat a well balanced diet and drink 6 to 8 glasses of water every day to avoid constipation. You may also use a bulk laxative.   Avoid straining to have bowel movements.   Keep anal area dry and clean.   Do not use a donut shaped pillow or sit on the toilet for long periods. This increases blood pooling and pain.   Move your bowels when your body has the urge; this will require less straining and will decrease pain and pressure.

## 2017-07-03 NOTE — Anesthesia Preprocedure Evaluation (Signed)
Anesthesia Evaluation  Patient identified by MRN, date of birth, ID band Patient awake    Reviewed: Allergy & Precautions, H&P , NPO status , Patient's Chart, lab work & pertinent test results, reviewed documented beta blocker date and time   Airway Mallampati: III  TM Distance: >3 FB Neck ROM: full    Dental no notable dental hx.    Pulmonary neg pulmonary ROS, sleep apnea , former smoker,    Pulmonary exam normal breath sounds clear to auscultation       Cardiovascular Exercise Tolerance: Good negative cardio ROS   Rhythm:regular Rate:Normal     Neuro/Psych negative neurological ROS  negative psych ROS   GI/Hepatic negative GI ROS, Neg liver ROS, GERD  ,Crohn's disease (HCC)   Endo/Other  negative endocrine ROSMorbid obesity  Renal/GU negative Renal ROS  negative genitourinary   Musculoskeletal   Abdominal   Peds  Hematology negative hematology ROS (+) anemia ,   Anesthesia Other Findings   Reproductive/Obstetrics negative OB ROS                             Anesthesia Physical Anesthesia Plan  ASA: III  Anesthesia Plan: MAC   Post-op Pain Management:    Induction:   PONV Risk Score and Plan:   Airway Management Planned:   Additional Equipment:   Intra-op Plan:   Post-operative Plan:   Informed Consent: I have reviewed the patients History and Physical, chart, labs and discussed the procedure including the risks, benefits and alternatives for the proposed anesthesia with the patient or authorized representative who has indicated his/her understanding and acceptance.   Dental Advisory Given  Plan Discussed with: CRNA  Anesthesia Plan Comments:         Anesthesia Quick Evaluation

## 2017-07-03 NOTE — H&P (Addendum)
Primary Care Physician:  Kirstie Peri, MD Primary Gastroenterologist:  Dr. Darrick Penna  Pre-Procedure History & Physical: HPI:  Shannon Bentley is a 27 y.o. female here for possible Crohn's disease. Ate regular lunch yesterday. MOTHER REPORTS PT HAS DIARRHEA AND ABDOMINAL PAIN RESULTS ING IN CT WHICH SHOWED R COLON MASS. SINCE SURGERY PT CONTINUES TO HAVE INTERMITTENT ABDOMINAL PAIN AND DIARRHEA.  TODAY PT DENIES FEVER, CHILLS, HEMATOCHEZIA, HEMATEMESIS, nausea, vomiting, melena, diarrhea, CHEST PAIN, SHORTNESS OF BREATH,  CHANGE IN BOWEL IN HABITS, constipation, abdominal pain, problems swallowing.    Past Medical History:  Diagnosis Date  . Anemia   . Crohn's disease (HCC)   . GERD (gastroesophageal reflux disease)   . Sleep apnea     Past Surgical History:  Procedure Laterality Date  . APPENDECTOMY    . COLONOSCOPY  07/2015   Dr. Teena Dunk: evidence of prior surgical anastomosis, multiple biopsies. 2 small ulcers at anastomosis. TI and remaining colon normal, without evidence of IBD. Path with acute colitis with reactive changes, features of Crohn's not specifically identified. non-specific. differentials including infection, drug effects, self-limited colitis  . EXPLORATORY LAPAROTOMY  05/2015   Dr. 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 path with crohn's disease involving both ileum and cecum  . TONSILLECTOMY    . tonsils and adenoids      Prior to Admission medications   Medication Sig Start Date End Date Taking? Authorizing Provider  acetaminophen (TYLENOL) 500 MG tablet Take 500 mg by mouth daily as needed for moderate pain or headache.   Yes [provider]  clobetasol cream (TEMOVATE) 0.05 % Apply 1 application topically 2 (two) times daily.   Yes [provider]  drospirenone-ethinyl estradiol (YASMIN,ZARAH,SYEDA) 3-0.03 MG tablet Take 1 tablet by mouth daily.    Yes [provider]  esomeprazole (NEXIUM) 20 MG capsule Take 20 mg by mouth daily.    Yes [provider]  folic acid (FOLVITE) 400 MCG tablet Take 400 mcg by mouth daily.   Yes [provider]  hydrocortisone cream 0.5 % Apply 1 application topically as needed (eczema).    Yes [provider]  levocetirizine (XYZAL) 5 MG tablet Take 2.5-5 mg by mouth every evening.    Yes [provider]  Multiple Vitamins-Minerals (AIRBORNE PO) Take 1 tablet by mouth daily as needed (immune support).    Yes [provider]  polyethylene glycol-electrolytes (TRILYTE) 420 g solution Take 4,000 mLs by mouth as directed. 05/10/17  Yes Blen Ransome, Darleene Cleaver, MD  Prenatal Vit-Fe Fumarate-FA (PRENATAL VITAMIN PO) Take 1 tablet by mouth daily.    Yes [provider]    Allergies as of 05/10/2017 - Review Complete 05/02/2017  Allergen Reaction Noted  . Bactrim [sulfamethoxazole-trimethoprim] Hives 05/02/2017  . Benadryl [diphenhydramine] Hives 05/02/2017  . Dilaudid [hydromorphone hcl] Hives 05/02/2017    Family History  Problem Relation Age of Onset  . Diabetes Mother   . Hypertension Mother   . Diabetes Father   . Hypertension Father   . Colon cancer Neg Hx   . Colon polyps Neg Hx   . Inflammatory bowel disease Neg Hx     Social History   Socioeconomic History  . Marital status: Single    Spouse name: Not on file  . Number of children: Not on file  . Years of education: Not on file  . Highest education level: Not on file  Occupational History  . Not on file  Social Needs  . Financial resource strain: Not on file  . Food insecurity:    Worry: Not on file    Inability: Not on file  . Transportation needs:    Medical: Not on file    Non-medical: Not on file  Tobacco Use  . Smoking status: Former Smoker    Packs/day: 0.25    Years: 1.00    Pack years: 0.25    Types: E-cigarettes    Last attempt to quit: 06/28/2015    Years since  quitting: 2.0  . Smokeless tobacco: Never Used  . Tobacco comment: vaped in past  Substance and Sexual Activity  . Alcohol use: Yes    Comment: rare  . Drug use: No  . Sexual activity: Yes    Birth control/protection: Pill  Lifestyle  . Physical activity:    Days per week: Not on file    Minutes per session: Not on file  . Stress: Not on file  Relationships  . Social connections:    Talks on phone: Not on file    Gets together: Not on file    Attends religious service: Not on file    Active member of club or organization: Not on file    Attends meetings of clubs or organizations: Not on file    Relationship status: Not on file  . Intimate partner violence:    Fear of current or ex partner: Not on file    Emotionally abused: Not on file    Physically abused: Not on file    Forced sexual activity: Not on file  Other Topics Concern  . Not on file  Social History Narrative  . Not on file    Review of Systems: See HPI, otherwise negative ROS   Physical Exam: BP 110/69   Temp 98.1 F (36.7 C) (Oral)   Resp (!) 25   LMP 06/11/2017   SpO2 98%  General:   Alert,  pleasant and cooperative in NAD Head:  Normocephalic and atraumatic. Neck:  Supple; Lungs:  Clear throughout to auscultation.    Heart:  Regular rate and rhythm. Abdomen:  Soft, nontender and nondistended. Normal bowel sounds, without guarding, and without rebound.   Neurologic:  Alert and  oriented x4;  grossly normal neurologically.  Impression/Plan:     ABDOMINAL  PAIN  PLAN: 1. TCS TODAY-BIOPSY COLON and ileum. DISCUSSED PROCEDURE, BENEFITS, & RISKS: < 1% chance of medication reaction, bleeding, perforation, or rupture of spleen/liver.

## 2017-07-04 ENCOUNTER — Encounter: Payer: Self-pay | Admitting: Gastroenterology

## 2017-07-05 NOTE — Progress Notes (Signed)
Shannon Bentley, please note. Dr. Darrick Penna will be back tomorrow.

## 2017-07-06 ENCOUNTER — Encounter (HOSPITAL_COMMUNITY): Payer: Self-pay | Admitting: Gastroenterology

## 2017-07-10 ENCOUNTER — Telehealth: Payer: Self-pay | Admitting: Gastroenterology

## 2017-07-10 DIAGNOSIS — K5080 Crohn's disease of both small and large intestine without complications: Secondary | ICD-10-CM

## 2017-07-10 NOTE — Telephone Encounter (Addendum)
Called patient TO DISCUSS RESULTS.MAY 2019: ILEOTCS-SEVERELY ACTIVE CHRONIC ILEITIS W/ CRYPTITIS/NO GRANULOMAS(i1). PT CURRENTLY WITHOUT ABDOMINAL PAIN/DIARRHEA.   AVOID TOBACCO PRODUCTS AND ASA/NSAIDs. FOLLOW Q6MOS.  NEEDS LIVER PANEL AND FECAL CALPROTECTIN OR CRP QYEAR STARTING MAY 2020.

## 2017-07-12 ENCOUNTER — Telehealth: Payer: Self-pay | Admitting: Gastroenterology

## 2017-07-12 NOTE — Telephone Encounter (Signed)
REVIEWED. AGREE. NO ADDITIONAL RECOMMENDATIONS. 

## 2017-07-12 NOTE — Telephone Encounter (Signed)
Pt called and said she was unable to speak with Dr. Darrick Penna when she called. She is aware of the results and plan. She is not having any abdominal pain or diarrhea at this time and said she will let us know if she has problems. Forwarding to [place] to nic the appts and labs.

## 2017-07-12 NOTE — Telephone Encounter (Signed)
Pt said she was returning a call regarding her results. Please call her at [phone]

## 2017-07-12 NOTE — Telephone Encounter (Signed)
Pt aware.  See result notes.

## 2017-07-12 NOTE — Telephone Encounter (Signed)
ON RECALL AND SCHEDULED

## 2017-10-12 ENCOUNTER — Emergency Department (HOSPITAL_COMMUNITY): Payer: 59

## 2017-10-12 ENCOUNTER — Emergency Department (HOSPITAL_COMMUNITY)
Admission: EM | Admit: 2017-10-12 | Discharge: 2017-10-13 | Disposition: A | Discharge status: Home / Self Care | Payer: 59 | Attending: Emergency Medicine | Admitting: Emergency Medicine

## 2017-10-12 ENCOUNTER — Other Ambulatory Visit: Payer: Self-pay

## 2017-10-12 ENCOUNTER — Encounter (HOSPITAL_COMMUNITY): Payer: Self-pay | Admitting: *Deleted

## 2017-10-12 DIAGNOSIS — Z79899 Other long term (current) drug therapy: Secondary | ICD-10-CM | POA: Insufficient documentation

## 2017-10-12 DIAGNOSIS — Z87891 Personal history of nicotine dependence: Secondary | ICD-10-CM | POA: Diagnosis not present

## 2017-10-12 DIAGNOSIS — K529 Noninfective gastroenteritis and colitis, unspecified: Secondary | ICD-10-CM | POA: Diagnosis not present

## 2017-10-12 DIAGNOSIS — R1013 Epigastric pain: Secondary | ICD-10-CM | POA: Diagnosis present

## 2017-10-12 LAB — CBC
HCT: 40.3 % (ref 36.0–46.0)
HEMOGLOBIN: 12.9 g/dL (ref 12.0–15.0)
MCH: 26.7 pg (ref 26.0–34.0)
MCHC: 32 g/dL (ref 30.0–36.0)
MCV: 83.4 fL (ref 78.0–100.0)
PLATELETS: 372 10*3/uL (ref 150–400)
RBC: 4.83 MIL/uL (ref 3.87–5.11)
RDW: 13.5 % (ref 11.5–15.5)
WBC: 8.8 10*3/uL (ref 4.0–10.5)

## 2017-10-12 LAB — URINALYSIS, ROUTINE W REFLEX MICROSCOPIC
BILIRUBIN URINE: NEGATIVE
Glucose, UA: NEGATIVE mg/dL
Hgb urine dipstick: NEGATIVE
KETONES UR: NEGATIVE mg/dL
Leukocytes, UA: NEGATIVE
NITRITE: NEGATIVE
PROTEIN: NEGATIVE mg/dL
Specific Gravity, Urine: 1.031 — ABNORMAL HIGH (ref 1.005–1.030)
pH: 6 (ref 5.0–8.0)

## 2017-10-12 LAB — I-STAT BETA HCG BLOOD, ED (MC, WL, AP ONLY)

## 2017-10-12 LAB — COMPREHENSIVE METABOLIC PANEL
ALBUMIN: 3.6 g/dL (ref 3.5–5.0)
ALK PHOS: 68 U/L (ref 38–126)
ALT: 13 U/L (ref 0–44)
ANION GAP: 7 (ref 5–15)
AST: 17 U/L (ref 15–41)
BILIRUBIN TOTAL: 0.5 mg/dL (ref 0.3–1.2)
BUN: 15 mg/dL (ref 6–20)
CALCIUM: 8.8 mg/dL — AB (ref 8.9–10.3)
CO2: 24 mmol/L (ref 22–32)
Chloride: 107 mmol/L (ref 98–111)
Creatinine, Ser: 0.8 mg/dL (ref 0.44–1.00)
GFR calc Af Amer: >60 mL/min (ref >60)
GFR calc non Af Amer: >60 mL/min (ref >60)
GLUCOSE: 129 mg/dL — AB (ref 70–99)
Potassium: 3.7 mmol/L (ref 3.5–5.1)
Sodium: 138 mmol/L (ref 135–145)
TOTAL PROTEIN: 7.5 g/dL (ref 6.5–8.1)

## 2017-10-12 LAB — LIPASE, BLOOD: Lipase: 25 U/L (ref 11–51)

## 2017-10-12 MED ORDER — SODIUM CHLORIDE 0.9 % IV BOLUS
1000.0000 mL | Freq: Once | INTRAVENOUS | Status: AC
  Administered 2017-10-12: 1000 mL via INTRAVENOUS

## 2017-10-12 MED ORDER — ONDANSETRON HCL 4 MG/2ML IJ SOLN
4.0000 mg | Freq: Once | INTRAMUSCULAR | Status: AC
  Administered 2017-10-12: 4 mg via INTRAVENOUS
  Filled 2017-10-12: qty 2

## 2017-10-12 NOTE — ED Provider Notes (Signed)
[facility] EMERGENCY DEPARTMENT Provider Note   CSN: [id number] Arrival date & time: 10/12/17  2125     History   Chief Complaint Chief Complaint  Patient presents with  . Abdominal Pain    HPI Shannon Bentley is a 27 y.o. female with a history of currently fairly well controlled Crohn's disease and gerd, s/p appendectomy with right sided partial colectomy in 2017 presenting with a 2 day history of intestinal cramping, nonbloody and without mucous,  watery diarrhea triggered by meals and nausea without emesis. She also denies fevers or chills.  She reports exposure to her cousin 3 days ago who had vomiting and diarrhea symptoms as well, and states her symptoms are not of a pattern c/w her Crohn's.  She has been able to tolerate PO fluids but any solid foods triggers an episode of diarrhea, which she states she had 2 episodes of diarrhea today. She has had no tx for her symptoms. She denies foreign travel, no recent abx, but sick exposure per above.  The history is provided by the patient.    Past Medical History:  Diagnosis Date  . Anemia   . Crohn's disease (HCC)   . GERD (gastroesophageal reflux disease)   . Sleep apnea     Patient Active Problem List   Diagnosis Date Noted  . Diarrhea   . Crohn's disease of both small and large intestine (HCC) 05/02/2017    Past Surgical History:  Procedure Laterality Date  . APPENDECTOMY    . BIOPSY  07/03/2017   Procedure: BIOPSY;  Surgeon: West Bali, MD;  Location: [facility];  Service: Endoscopy;;  ileum random colon  . COLONOSCOPY  07/2015   Dr. Teena Dunk: evidence of prior surgical anastomosis, multiple biopsies. 2 small ulcers at anastomosis. TI and remaining colon normal, without evidence of IBD. Path with acute colitis with reactive changes, features of Crohn's not specifically identified. non-specific. differentials including infection, drug effects, self-limited colitis  . COLONOSCOPY WITH PROPOFOL N/A 07/03/2017   Procedure: COLONOSCOPY WITH PROPOFOL;  Surgeon: West Bali, MD;  Location: [facility];  Service: Endoscopy;  Laterality: N/A;  8:30am  . EXPLORATORY LAPAROTOMY  05/2015   Dr. 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 path with crohn's disease involving both ileum and cecum  . TONSILLECTOMY    . tonsils and adenoids       OB History   None      Home Medications    Prior to Admission medications   Medication Sig Start Date End Date Taking? Authorizing Provider  cephALEXin (KEFLEX) 250 MG capsule Take 250 mg by mouth 3 (three) times daily. 10/04/17  Yes [provider]  clobetasol cream (TEMOVATE) 0.05 % Apply 1 application topically 2 (two) times daily.   Yes [provider]  drospirenone-ethinyl estradiol (YASMIN,ZARAH,SYEDA) 3-0.03 MG tablet Take 1 tablet by mouth daily.   Yes [provider]  folic acid (FOLVITE) 400 MCG tablet Take 400 mcg by mouth daily.   Yes [provider]  levocetirizine (XYZAL) 5 MG tablet Take 2.5-5 mg by mouth every evening.    Yes [provider]  Multiple Vitamins-Minerals (AIRBORNE PO) Take 1 tablet by mouth daily as needed (immune support).    Yes [provider]  Prenatal Vit-Fe Fumarate-FA (PREPLUS) 27-1 MG TABS  10/04/17  Yes [provider]  diphenoxylate-atropine (LOMOTIL) 2.5-0.025 MG tablet Take 1 tablet by mouth 4 (four) times daily as  needed for diarrhea or loose stools. 10/13/17   Burgess Amor, PA-C  ondansetron (ZOFRAN ODT) 4 MG disintegrating tablet Take 1 tablet (4 mg total) by mouth every 8 (eight) hours as needed for nausea or vomiting. 10/13/17   Burgess Amor, PA-C    Family History Family History  Problem Relation Age of Onset  . Diabetes Mother   . Hypertension Mother   . Diabetes Father   . Hypertension Father   . Colon cancer Neg Hx   . Colon polyps Neg Hx   .  Inflammatory bowel disease Neg Hx     Social History Social History   Tobacco Use  . Smoking status: Former Smoker    Packs/day: 0.25    Years: 1.00    Pack years: 0.25    Types: E-cigarettes    Last attempt to quit: 06/28/2015    Years since quitting: 2.2  . Smokeless tobacco: Never Used  . Tobacco comment: vaped in past  Substance Use Topics  . Alcohol use: Yes    Comment: rare  . Drug use: No     Allergies   Bactrim [sulfamethoxazole-trimethoprim]; Benadryl [diphenhydramine]; and Dilaudid [hydromorphone hcl]   Review of Systems Review of Systems  Constitutional: Negative for fever.  HENT: Negative for congestion and sore throat.   Eyes: Negative.   Respiratory: Negative for chest tightness and shortness of breath.   Cardiovascular: Negative for chest pain.  Gastrointestinal: Positive for abdominal pain, diarrhea and nausea. Negative for blood in stool and vomiting.  Genitourinary: Negative.   Musculoskeletal: Negative for arthralgias, joint swelling and neck pain.  Skin: Negative.  Negative for rash and wound.  Neurological: Negative for dizziness, weakness, light-headedness, numbness and headaches.  Psychiatric/Behavioral: Negative.      Physical Exam Updated Vital Signs BP 105/64 (BP Location: Right Arm)   Pulse 93   Temp 98.6 F (37 C) (Oral)   Resp 18   Ht 5\' 1"  (1.549 m)   Wt 111.1 kg   BMI 46.29 kg/m   Physical Exam  Constitutional: She appears well-developed and well-nourished.  HENT:  Head: Normocephalic and atraumatic.  Eyes: Conjunctivae are normal.  Neck: Normal range of motion.  Cardiovascular: Normal rate, regular rhythm, normal heart sounds and intact distal pulses.  Pulmonary/Chest: Effort normal and breath sounds normal. She has no wheezes.  Abdominal: Soft. Bowel sounds are normal. She exhibits no distension and no mass. There is no hepatosplenomegaly. There is tenderness in the epigastric area and periumbilical area. There is no  rigidity, no rebound and no guarding.  Musculoskeletal: Normal range of motion.  Neurological: She is alert.  Skin: Skin is warm and dry.  Psychiatric: She has a normal mood and affect.  Nursing note and vitals reviewed.    ED Treatments / Results  Labs (all labs ordered are listed, but only abnormal results are displayed) Labs Reviewed  COMPREHENSIVE METABOLIC PANEL - Abnormal; Notable for the following components:      Result Value   Glucose, Bld 129 (*)    Calcium 8.8 (*)    All other components within normal limits  URINALYSIS, ROUTINE W REFLEX MICROSCOPIC - Abnormal; Notable for the following components:   APPearance HAZY (*)    Specific Gravity, Urine 1.031 (*)    All other components within normal limits  LIPASE, BLOOD  CBC  I-STAT BETA HCG BLOOD, ED ([facility], [facility], [facility] ONLY)    EKG None  Radiology Dg Abd Acute W/chest  Result Date: 10/12/2017 CLINICAL DATA:  Lower abdominal  pain and diarrhea onset last evening EXAM: DG ABDOMEN ACUTE W/ 1V CHEST COMPARISON:  None. FINDINGS: Nonobstructed bowel gas pattern. Gas is seen within distal ileum as well as the colon to the level of the rectum. No radiopaque calculi or other significant radiographic abnormality is seen. Heart size and mediastinal contours are within normal limits. Both lungs are clear. IMPRESSION: Negative abdominal radiographs.  No acute cardiopulmonary disease. Electronically Signed   By: Tollie Eth M.D.   On: 10/12/2017 23:47    Procedures Procedures (including critical care time)  Medications Ordered in ED Medications  sodium chloride 0.9 % bolus 1,000 mL (1,000 mLs Intravenous New Bag/Given 10/12/17 2308)  diphenoxylate-atropine (LOMOTIL) 2.5-0.025 MG per tablet 2 tablet (has no administration in time range)  ondansetron (ZOFRAN) injection 4 mg (4 mg Intravenous Given 10/12/17 2308)     Initial Impression / Assessment and Plan / ED Course  I have reviewed the triage vital signs and the nursing  notes.  Pertinent labs & imaging results that were available during my care of the patient were reviewed by me and considered in my medical decision making (see chart for details).     Labs and imaging reviewed, at re exam, no acute abd findings.  Pt has had no diarrhea while here and was able to tolerate PO intake.  She was given a dose of lomotil for abd cramping, advised to use sparingly to avoid constipation.  Zofran.  Prn f/u with pcp if sx persist. Favor gastroenteritis given recent sick exposure, less likely this is a Crohns flair as she is afebrile, normal wbc, benign abd exam.  Return precautions discussed.  Final Clinical Impressions(s) / ED Diagnoses   Final diagnoses:  Gastroenteritis, infectious, presumed    ED Discharge Orders         Ordered    ondansetron (ZOFRAN ODT) 4 MG disintegrating tablet  Every 8 hours PRN     10/13/17 0006    diphenoxylate-atropine (LOMOTIL) 2.5-0.025 MG tablet  4 times daily PRN     10/13/17 0006           Burgess Amor, PA-C 10/13/17 0008    Mancel Bale, MD 10/13/17 1104

## 2017-10-12 NOTE — ED Triage Notes (Signed)
Pt c/o abdominal pain with diarrhea and nausea; pt states the pain is more on the right lower abdominal side x 2 days

## 2017-10-13 MED ORDER — DIPHENOXYLATE-ATROPINE 2.5-0.025 MG PO TABS
2.0000 | ORAL_TABLET | Freq: Once | ORAL | Status: AC
  Administered 2017-10-13: 2 via ORAL
  Filled 2017-10-13: qty 2

## 2017-10-13 MED ORDER — ONDANSETRON 4 MG PO TBDP: 4.0000 mg | ORAL_TABLET | Freq: Three times a day (TID) | ORAL | 0 refills | Status: DC | PRN

## 2017-10-13 MED ORDER — DIPHENOXYLATE-ATROPINE 2.5-0.025 MG PO TABS: 1.0000 | ORAL_TABLET | Freq: Four times a day (QID) | ORAL | 0 refills | Status: DC | PRN

## 2017-10-13 NOTE — Discharge Instructions (Signed)
Take the medicines prescribed if your symptoms persist.  Use lomotil sparingly - this can make you constipated if you continue to take it once your diarrhea is resolved.  Increase fluid intake.  Maintain a bland diet until your symptoms have improved.

## 2017-10-13 NOTE — ED Notes (Signed)
Patient given ice and Ginger-ale.

## 2017-10-13 NOTE — ED Notes (Signed)
Patient finishing fluid bolus before discharge/ per provider instructions.

## 2017-10-31 ENCOUNTER — Encounter: Payer: Self-pay | Admitting: Gastroenterology

## 2017-10-31 ENCOUNTER — Ambulatory Visit (INDEPENDENT_AMBULATORY_CARE_PROVIDER_SITE_OTHER): Payer: 59 | Admitting: Gastroenterology

## 2017-10-31 DIAGNOSIS — K5080 Crohn's disease of both small and large intestine without complications: Secondary | ICD-10-CM

## 2017-10-31 MED ORDER — CALCIUM CARBONATE ANTACID 500 MG PO CHEW: 1.0000 | CHEWABLE_TABLET | Freq: Three times a day (TID) | ORAL | 11 refills | Status: DC

## 2017-10-31 NOTE — Progress Notes (Signed)
Subjective:    Patient ID: Shannon Bentley, female    DOB: [birth year], 27 y.o.   MRN: [id number]  Monico Blitz, MD  HPI Been doing better and going to the gym. CHANGED HER DIET. GOING ORGANIC JUICES. BAKED/BROILED/ROATED/GRILL. OCCASIONAL ABDOMINAL PAIN(RLQ, SHARP)(DEPENDS ON WHAT SHE EATS-MCDONALD'S). MAY HAVE #1-7, USUALLY #4-6. NOCTURNAL STOOLS: DEPENDS ON WHAT SHE EATS). WAS TAKING KEFLEX DUE TO SKIN INFECTION ON LEFT ARM. HAS NIGHT SWEATS. HAS BAD HAs. TRYING TO GIVE UP SWEET TEA.   PT DENIES FEVER, CHILLS, HEMATOCHEZIA, HEMATEMESIS, nausea, vomiting, melena, diarrhea, CHEST PAIN, SHORTNESS OF BREATH,  CHANGE IN BOWEL IN HABITS, constipation, problems swallowing, problems with sedation, OR heartburn or indigestion.  Past Medical History:  Diagnosis Date  . Anemia   . Crohn's disease ([place])   . GERD (gastroesophageal reflux disease)   . Sleep apnea     Past Surgical History:  Procedure Laterality Date  . APPENDECTOMY    . BIOPSY  07/03/2017   Procedure: BIOPSY;  Surgeon: Danie Binder, MD;  Location: [facility];  Service: Endoscopy;;  ileum random colon  . COLONOSCOPY  07/2015   Dr. Britta Mccreedy: evidence of prior surgical anastomosis, multiple biopsies. 2 small ulcers at anastomosis. TI and remaining colon normal, without evidence of IBD. Path with acute colitis with reactive changes, features of Crohn's not specifically identified. non-specific. differentials including infection, drug effects, self-limited colitis  . COLONOSCOPY WITH PROPOFOL N/A 07/03/2017   Procedure: COLONOSCOPY WITH PROPOFOL;  Surgeon: Danie Binder, MD;  Location: [facility];  Service: Endoscopy;  Laterality: N/A;  8:30am  . EXPLORATORY LAPAROTOMY  05/2015   Dr. 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 path with crohn's disease involving both ileum and cecum  . TONSILLECTOMY    . tonsils and adenoids       Allergies  Allergen Reactions  . Bactrim [Sulfamethoxazole-Trimethoprim] Hives  . Benadryl [Diphenhydramine] Hives  . Dilaudid [Hydromorphone Hcl] Hives    Current Outpatient Medications  Medication Sig    . clobetasol cream (TEMOVATE) 5.95 % Apply 1 application topically 2 (two) times daily.    .  (LOMOTIL) 2.5-0.025 MG  Take 1 tabletQID ORN BUT NOT USING    . drospirenone-ethinyl estradiol (YASMIN,ZARAH,SYEDA) 3-0.03 MG tablet Take 1 tablet by mouth daily.    Lucinda Dell) 400 MCG tablet Take 400 mcg by mouth daily.    Marland Kitchen lXYZAL) 5 MG tablet Take 2.5-5 mg by mouth every evening.     .  (AIRBORNE PO) Take 1 table QD    .  (PREPLUS) 27-1 MG TABS     .      Marland Kitchen        Social History   Tobacco Use  . Smoking status: Former Smoker    Packs/day: 0.25    Years: 1.00    Pack years: 0.25    Types: E-cigarettes    Last attempt to quit: 06/28/2015    Years since quitting: 2.3  . Smokeless tobacco: Never Used  . Tobacco comment: vaped in past  Substance Use Topics  . Alcohol use: Not Currently    Comment: rare  . Drug use: No   Review of Systems PER HPI OTHERWISE ALL SYSTEMS ARE NEGATIVE. LIKES TO SHOP, GET TATTOOS, AND REAL JEWELRY.    Objective:   Physical Exam  Constitutional: She is oriented to person, place, and time. She appears well-developed and well-nourished. No distress.  HENT:  Head: Normocephalic and  atraumatic.  Mouth/Throat: Oropharynx is clear and moist. No oropharyngeal exudate.  Eyes: Pupils are equal, round, and reactive to light. No scleral icterus.  Neck: Normal range of motion. Neck supple.  Cardiovascular: Normal rate, regular rhythm and normal heart sounds.  Pulmonary/Chest: Effort normal and breath sounds normal. No respiratory distress.  Abdominal: Soft. Bowel sounds are normal. She exhibits no distension. There is no tenderness.  Musculoskeletal: She exhibits no edema.  Lymphadenopathy:    She has no cervical adenopathy.  Neurological: She is alert  and oriented to person, place, and time.  Psychiatric: She has a normal mood and affect.  Vitals reviewed.     Assessment & Plan:

## 2017-10-31 NOTE — Assessment & Plan Note (Addendum)
SYMPTOMS FAIRLY WELL CONTROLLED AND EXACERBATED BY FAT INTAKE AND DAIRY.  LESS DAIRY, LESS FAT, LESS BOWEL IRREGULARITY. FECAL CALPROTECTIN.  HANDOUT GIVEN. COMPLETE BLOOD DRAW FOR: VIT D 25-OH, CRP, HEP A Ab, HEP B SURFACE Ab, & VITAMIN B12. PLEASE CALL WITH QUESTIONS OR CONCERNS.  FOLLOW UP IN 4 MOS.

## 2017-10-31 NOTE — Progress Notes (Signed)
cc'ed to pcp °

## 2017-10-31 NOTE — Progress Notes (Signed)
ON RECALL  °

## 2017-10-31 NOTE — Patient Instructions (Addendum)
LESS DAIRY, LESS FAT, LESS BOWEL IRREGULARITY. SEE INFO BELOW.  COMPLETE BLOOD DRAW FOR: VIT D 25-OH, CRP, HEP B SURFACE Ab, & VITAMIN B12.  PLEASE CALL WITH QUESTIONS OR CONCERNS.  FOLLOW UP IN 4 MOS.   Low-Fat Diet BREADS, CEREALS, PASTA, RICE, DRIED PEAS, AND BEANS These products are high in carbohydrates and most are low in fat. Therefore, they can be increased in the diet as substitutes for fatty foods. They too, however, contain calories and should not be eaten in excess. Cereals can be eaten for snacks as well as for breakfast.   FRUITS AND VEGETABLES It is good to eat fruits and vegetables. Besides being sources of fiber, both are rich in vitamins and some minerals. They help you get the daily allowances of these nutrients. Fruits and vegetables can be used for snacks and desserts.  MEATS Limit lean meat, chicken, [place], and fish to no more than 6 ounces per day. Beef, Pork, and Lamb Use lean cuts of beef, pork, and lamb. Lean cuts include:  Extra-lean ground beef.  Arm roast.  Sirloin tip.  Center-cut ham.  Round steak.  Loin chops.  Rump roast.  Tenderloin.  Trim all fat off the outside of meats before cooking. It is not necessary to severely decrease the intake of red meat, but lean choices should be made. Lean meat is rich in protein and contains a highly absorbable form of iron. Premenopausal women, in particular, should avoid reducing lean red meat because this could increase the risk for low red blood cells (iron-deficiency anemia).  Chicken and [place] These are good sources of protein. The fat of poultry can be reduced by removing the skin and underlying fat layers before cooking. Chicken and [place] can be substituted for lean red meat in the diet. Poultry should not be fried or covered with high-fat sauces. Fish and Shellfish Fish is a good source of protein. Shellfish contain cholesterol, but they usually are low in saturated fatty acids. The preparation of fish is  important. Like chicken and [place], they should not be fried or covered with high-fat sauces. EGGS Egg whites contain no fat or cholesterol. They can be eaten often. Try 1 to 2 egg whites instead of whole eggs in recipes or use egg substitutes that do not contain yolk. MILK AND DAIRY PRODUCTS Use skim or 1% milk instead of 2% or whole milk. Decrease whole milk, natural, and processed cheeses. Use nonfat or low-fat (2%) cottage cheese or low-fat cheeses made from vegetable oils. Choose nonfat or low-fat (1 to 2%) yogurt. Experiment with evaporated skim milk in recipes that call for heavy cream. Substitute low-fat yogurt or low-fat cottage cheese for sour cream in dips and salad dressings. Have at least 2 servings of low-fat dairy products, such as 2 glasses of skim (or 1%) milk each day to help get your daily calcium intake. FATS AND OILS Reduce the total intake of fats, especially saturated fat. Butterfat, lard, and beef fats are high in saturated fat and cholesterol. These should be avoided as much as possible. Vegetable fats do not contain cholesterol, but certain vegetable fats, such as coconut oil, palm oil, and palm kernel oil are very high in saturated fats. These should be limited. These fats are often used in bakery goods, processed foods, popcorn, oils, and nondairy creamers. Vegetable shortenings and some peanut butters contain hydrogenated oils, which are also saturated fats. Read the labels on these foods and check for saturated vegetable oils. Unsaturated vegetable oils and fats  do not raise blood cholesterol. However, they should be limited because they are fats and are high in calories. Total fat should still be limited to 30% of your daily caloric intake. Desirable liquid vegetable oils are corn oil, cottonseed oil, olive oil, canola oil, safflower oil, soybean oil, and sunflower oil. Peanut oil is not as good, but small amounts are acceptable. Buy a heart-healthy tub margarine that has no  partially hydrogenated oils in the ingredients. Mayonnaise and salad dressings often are made from unsaturated fats, but they should also be limited because of their high calorie and fat content. Seeds, nuts, peanut butter, olives, and avocados are high in fat, but the fat is mainly the unsaturated type. These foods should be limited mainly to avoid excess calories and fat. OTHER EATING TIPS Snacks  Most sweets should be limited as snacks. They tend to be rich in calories and fats, and their caloric content outweighs their nutritional value. Some good choices in snacks are graham crackers, melba toast, soda crackers, bagels (no egg), English muffins, fruits, and vegetables. These snacks are preferable to snack crackers, [place] fries, TORTILLA CHIPS, and POTATO chips. Popcorn should be air-popped or cooked in small amounts of liquid vegetable oil. Desserts Eat fruit, low-fat yogurt, and fruit ices instead of pastries, cake, and cookies. Sherbet, angel food cake, gelatin dessert, frozen low-fat yogurt, or other frozen products that do not contain saturated fat (pure fruit juice bars, frozen ice pops) are also acceptable.  COOKING METHODS Choose those methods that use little or no fat. They include: Poaching.  Braising.  Steaming.  Grilling.  Baking.  Stir-frying.  Broiling.  Microwaving.  Foods can be cooked in a nonstick pan without added fat, or use a nonfat cooking spray in regular cookware. Limit fried foods and avoid frying in saturated fat. Add moisture to lean meats by using water, broth, cooking wines, and other nonfat or low-fat sauces along with the cooking methods mentioned above. Soups and stews should be chilled after cooking. The fat that forms on top after a few hours in the refrigerator should be skimmed off. When preparing meals, avoid using excess salt. Salt can contribute to raising blood pressure in some people.  EATING AWAY FROM HOME Order entres, potatoes, and vegetables  without sauces or butter. When meat exceeds the size of a deck of cards (3 to 4 ounces), the rest can be taken home for another meal. Choose vegetable or fruit salads and ask for low-calorie salad dressings to be served on the side. Use dressings sparingly. Limit high-fat toppings, such as bacon, crumbled eggs, cheese, sunflower seeds, and olives. Ask for heart-healthy tub margarine instead of butter.  Lactose Free Diet Lactose is a carbohydrate that is found mainly in milk and milk products, as well as in foods with added milk or whey. Lactose must be digested by the enzyme in order to be used by the body. Lactose intolerance occurs when there is a shortage of lactase. When your body is not able to digest lactose, you may feel sick to your stomach (nausea), bloating, cramping, gas and diarrhea.  There are many dairy products that may be tolerated better than milk by some people:  The use of cultured dairy products such as yogurt, buttermilk, cottage cheese, and sweet acidophilus milk (Kefir) for lactase-deficient individuals is usually well tolerated. This is because the healthy bacteria help digest lactose.   Lactose-hydrolyzed milk (Lactaid) contains 40-90% less lactose than milk and may also be well tolerated.  SPECIAL NOTES  Lactose is a carbohydrates. The major food source is dairy products. Reading food labels is important. Many products contain lactose even when they are not made from milk. Look for the following words: whey, milk solids, dry milk solids, nonfat dry milk powder. Typical sources of lactose other than dairy products include breads, candies, cold cuts, prepared and processed foods, and commercial sauces and gravies.   All foods must be prepared without milk, cream, or other dairy foods.   Soy milk and lactose-free supplements (LACTASE) may be used as an alternative to milk.   FOOD GROUP ALLOWED/RECOMMENDED AVOID/USE SPARINGLY  BREADS / STARCHES 4 servings or more*  Breads and rolls made without milk. [place], [place], or [place] bread. Breads and rolls that contain milk. Prepared mixes such as muffins, biscuits, waffles, pancakes. Sweet rolls, donuts, [place] toast (if made with milk or lactose).  Crackers: Soda crackers, graham crackers. Any crackers prepared without lactose. Zwieback crackers, corn curls, or any that contain lactose.  Cereals: Cooked or dry cereals prepared without lactose (read labels). Cooked or dry cereals prepared with lactose (read labels). Total, Cocoa Krispies. Special K.  Potatoes / Pasta / Rice: Any prepared without milk or lactose. Popcorn. Instant potatoes, frozen [place] fries, scalloped or au gratin potatoes.  VEGETABLES 2 servings or more Fresh, frozen, and canned vegetables. Creamed or breaded vegetables. Vegetables in a cheese sauce or with lactose-containing margarines.  FRUIT 2 servings or more All fresh, canned, or frozen fruits that are not processed with lactose. Any canned or frozen fruits processed with lactose.  MEAT & SUBSTITUTES 2 servings or more (4 to 6 oz. total per day) Plain beef, chicken, fish, [place], lamb, veal, pork, or ham. Kosher prepared meat products. Strained or junior meats that do not contain milk. Eggs, soy meat substitutes, nuts. Scrambled eggs, omelets, and souffles that contain milk. Creamed or breaded meat, fish, or fowl. Sausage products such as wieners, liver sausage, or cold cuts that contain milk solids. Cheese, cottage cheese, or cheese spreads.  MILK None. (See "BEVERAGES" for milk substitutes. See "DESSERTS" for ice cream and frozen desserts.) Milk (whole, 2%, skim, or chocolate). Evaporated, powdered, or condensed milk; malted milk.  SOUPS & COMBINATION FOODS Bouillon, broth, vegetable soups, clear soups, consomms. Homemade soups made with allowed ingredients. Combination or prepared foods that do not contain milk or milk products (read labels). Cream soups, chowders, commercially prepared  soups containing lactose. Macaroni and cheese, pizza. Combination or prepared foods that contain milk or milk products.  DESSERTS & SWEETS In moderation Water and fruit ices; gelatin; angel food cake. Homemade cookies, pies, or cakes made from allowed ingredients. Pudding (if made with water or a milk substitute). Lactose-free tofu desserts. Sugar, honey, corn syrup, jam, jelly; marmalade; molasses (beet sugar); Pure sugar candy; marshmallows. Ice cream, ice milk, sherbet, custard, pudding, frozen yogurt. Commercial cake and cookie mixes. Desserts that contain chocolate. Pie crust made with milk-containing margarine; reduced-calorie desserts made with a sugar substitute that contains lactose. Toffee, peppermint, butterscotch, chocolate, caramels.  FATS & OILS In moderation Butter (as tolerated; contains very small amounts of lactose). Margarines and dressings that do not contain milk, Vegetable oils, shortening, Miracle Whip, mayonnaise, nondairy cream & whipped toppings without lactose or milk solids added (examples: Coffee Rich, Carnation Coffeemate, Rich's Whipped Topping, PolyRich). Tomasa Blase. Margarines and salad dressings containing milk; cream, cream cheese; peanut butter with added milk solids, sour cream, chip dips, made with sour cream.  BEVERAGES Carbonated drinks; tea; coffee and freeze-dried coffee;  some instant coffees (check labels). Fruit drinks; fruit and vegetable juice; Rice or Soy milk. Ovaltine, hot chocolate. Some cocoas; some instant coffees; instant iced teas; powdered fruit drinks (read labels).   CONDIMENTS / MISCELLANEOUS Soy sauce, carob powder, olives, gravy made with water, baker's cocoa, pickles, pure seasonings and spices, wine, pure monosodium glutamate, catsup, mustard. Some chewing gums, chocolate, some cocoas. Certain antibiotics and vitamin / mineral preparations. Spice blends if they contain milk products. MSG extender. Artificial sweeteners that contain lactose such as  Equal (Nutra-Sweet) and Sweet 'n Low. Some nondairy creamers (read labels).   SAMPLE MENU*  Breakfast   Orange Juice.  Banana.   Bran flakes.   Nondairy Creamer.  Vienna Bread (toasted).   Butter or milk-free margarine.   Coffee or tea.    Noon Meal   Chicken Breast.  Rice.   Green beans.   Butter or milk-free margarine.  Fresh melon.   Coffee or tea.    Evening Meal   Roast Beef.  Baked potato.   Butter or milk-free margarine.   Broccoli.   Lettuce salad with vinegar and oil dressing.  [employer].   Coffee or tea.

## 2017-11-14 ENCOUNTER — Other Ambulatory Visit (HOSPITAL_COMMUNITY)
Admission: RE | Admit: 2017-11-14 | Discharge: 2017-11-14 | Disposition: A | Discharge status: Home / Self Care | Payer: 59 | Source: Ambulatory Visit | Attending: Gastroenterology | Admitting: Gastroenterology

## 2017-11-14 DIAGNOSIS — K5080 Crohn's disease of both small and large intestine without complications: Secondary | ICD-10-CM | POA: Diagnosis present

## 2017-11-14 LAB — VITAMIN B12: Vitamin B-12: 282 pg/mL (ref 180–914)

## 2017-11-14 LAB — C-REACTIVE PROTEIN: CRP: 1.8 mg/dL — ABNORMAL HIGH (ref <1.0)

## 2017-11-15 ENCOUNTER — Telehealth: Payer: Self-pay | Admitting: Gastroenterology

## 2017-11-15 LAB — VITAMIN D 25 HYDROXY (VIT D DEFICIENCY, FRACTURES): Vit D, 25-Hydroxy: 21.5 ng/mL — ABNORMAL LOW (ref 30.0–100.0)

## 2017-11-15 LAB — HEPATITIS A ANTIBODY, TOTAL: Hep A Total Ab: NEGATIVE

## 2017-11-15 LAB — HEPATITIS B SURFACE ANTIBODY, QUANTITATIVE: Hepatitis B-Post: <3.1 m[IU]/mL — ABNORMAL LOW (ref >9.9)

## 2017-11-15 MED ORDER — VITAMIN D 1000 UNITS PO TABS: 1000.0000 [IU] | ORAL_TABLET | Freq: Every day | ORAL | 11 refills | Status: DC

## 2017-11-15 MED ORDER — VITAMIN D (ERGOCALCIFEROL) 1.25 MG (50000 UNIT) PO CAPS: 50000.0000 [IU] | ORAL_CAPSULE | ORAL | 0 refills | Status: DC

## 2017-11-15 NOTE — Telephone Encounter (Signed)
PLEASE CALL PT. HER VITAMIN D LEVEL IS LOW. SHE NEEDS SUPPLEMENTS: VITAMIN D AND CALCIUM. SHE NEEDS VITAMIN D 50,000 UNITS A WEEK FOR 8 WEEKS THEN START VITAMIN D 1000 UNITS DAILY FOREVER. SHE NEEDS TO START CALCIUM 500 MG THREE TIMES A DAY FOREVER. THESE MEDS ARE AVAILABLE OTC. REPEAT VITAMIN D LEVEL IN JAN 2020.  SHE IS NOT IMMUNE TO HEPATITIS A OR B AND SHOULD SEE HER PCP FOR THE VACCINE.   HER B12 IS NORMAL.

## 2017-11-16 NOTE — Telephone Encounter (Signed)
LMOM to call.

## 2017-11-16 NOTE — Telephone Encounter (Signed)
PT is aware.

## 2017-11-16 NOTE — Telephone Encounter (Signed)
Returned pt's VM call.  LMOM for a return call.

## 2017-11-20 ENCOUNTER — Telehealth: Payer: Self-pay | Admitting: Gastroenterology

## 2017-11-20 NOTE — Telephone Encounter (Signed)
I reviewed her results again with her and I am sending her a print out.  Also, she said PCP does not give the Hept A or B vaccinations and I am mailing her the order to have done at drug store.

## 2017-11-20 NOTE — Telephone Encounter (Signed)
REVIEWED. AGREE. NO ADDITIONAL RECOMMENDATIONS. 

## 2017-11-20 NOTE — Telephone Encounter (Signed)
[phone] please call patient back, she has questions

## 2018-02-05 ENCOUNTER — Encounter: Payer: Self-pay | Admitting: Gastroenterology

## 2018-02-07 ENCOUNTER — Other Ambulatory Visit: Payer: Self-pay

## 2018-02-07 DIAGNOSIS — K50919 Crohn's disease, unspecified, with unspecified complications: Secondary | ICD-10-CM

## 2018-03-06 ENCOUNTER — Other Ambulatory Visit (HOSPITAL_COMMUNITY)
Admission: RE | Admit: 2018-03-06 | Discharge: 2018-03-06 | Disposition: A | Discharge status: Home / Self Care | Payer: 59 | Source: Ambulatory Visit | Attending: Gastroenterology | Admitting: Gastroenterology

## 2018-03-06 DIAGNOSIS — K50919 Crohn's disease, unspecified, with unspecified complications: Secondary | ICD-10-CM | POA: Insufficient documentation

## 2018-03-07 ENCOUNTER — Telehealth: Payer: Self-pay | Admitting: Gastroenterology

## 2018-03-07 DIAGNOSIS — E559 Vitamin D deficiency, unspecified: Secondary | ICD-10-CM

## 2018-03-07 LAB — VITAMIN D 25 HYDROXY (VIT D DEFICIENCY, FRACTURES): Vit D, 25-Hydroxy: 23 ng/mL — ABNORMAL LOW (ref 30.0–100.0)

## 2018-03-07 NOTE — Telephone Encounter (Signed)
PLEASE CALL PT. HER VITAMIN D LEVEL IS STILL LOW. SHE SHOULD'VE TAKEN 50K VITAMIN D AND CONTINUED ON VITAMIN D 1000 UNITS EVERY DAY. SHE NEEDS TO SEE ENDOCRINOLOGY FOR VITAMIN D DEFICIENCY.

## 2018-03-07 NOTE — Telephone Encounter (Signed)
LMOM to call.

## 2018-03-12 NOTE — Telephone Encounter (Signed)
Pt needs to see endocrinology to manage Vitamin D deficiency.

## 2018-03-12 NOTE — Telephone Encounter (Addendum)
Referral sent 

## 2018-03-12 NOTE — Addendum Note (Signed)
Addended by: Tommie Sams on: 03/12/2018 02:36 PM   Modules accepted: Orders

## 2018-03-12 NOTE — Telephone Encounter (Signed)
Pt is aware and OK to refer to Endocrinology.

## 2018-03-12 NOTE — Telephone Encounter (Signed)
Pt is aware of results. She took the Vit D 50,000 units but not the 1,000 units. I called the [facility] and spoke to [place] and he said it is OTC. Pt said she could not find it over the counter. I told her to ask for help and try another pharmacy if they do not have it.  She will let me know if there is a problem. Does pt need referral now for endocrinology or take the VIt D 1000 units first? Please advise!

## 2018-05-02 ENCOUNTER — Ambulatory Visit (INDEPENDENT_AMBULATORY_CARE_PROVIDER_SITE_OTHER): Payer: 59 | Admitting: "Endocrinology

## 2018-05-02 ENCOUNTER — Encounter: Payer: Self-pay | Admitting: "Endocrinology

## 2018-05-02 ENCOUNTER — Other Ambulatory Visit: Payer: Self-pay

## 2018-05-02 VITALS — BP 132/88 | HR 92 | Ht 61.0 in | Wt 272.0 lb

## 2018-05-02 DIAGNOSIS — E559 Vitamin D deficiency, unspecified: Secondary | ICD-10-CM | POA: Diagnosis not present

## 2018-05-02 MED ORDER — VITAMIN D3 125 MCG (5000 UT) PO CAPS: 5000.0000 [IU] | ORAL_CAPSULE | Freq: Every day | ORAL | 0 refills | Status: DC

## 2018-05-02 NOTE — Progress Notes (Signed)
Endocrinology Consult Note                                            05/02/2018, 2:02 PM   Subjective:    Patient ID: Shannon Bentley, female    DOB: 09-Oct-1990, PCP Kirstie Peri, MD   Past Medical History:  Diagnosis Date  . Anemia   . Crohn's disease (HCC)   . GERD (gastroesophageal reflux disease)   . Sleep apnea    Past Surgical History:  Procedure Laterality Date  . APPENDECTOMY    . BIOPSY  07/03/2017   Procedure: BIOPSY;  Surgeon: West Bali, MD;  Location: [facility];  Service: Endoscopy;;  ileum random colon  . COLONOSCOPY  07/2015   Dr. Teena Dunk: evidence of prior surgical anastomosis, multiple biopsies. 2 small ulcers at anastomosis. TI and remaining colon normal, without evidence of IBD. Path with acute colitis with reactive changes, features of Crohn's not specifically identified. non-specific. differentials including infection, drug effects, self-limited colitis  . COLONOSCOPY WITH PROPOFOL N/A 07/03/2017   Procedure: COLONOSCOPY WITH PROPOFOL;  Surgeon: West Bali, MD;  Location: [facility];  Service: Endoscopy;  Laterality: N/A;  8:30am  . EXPLORATORY LAPAROTOMY  05/2015   Dr. 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 path with crohn's disease involving both ileum and cecum  . TONSILLECTOMY    . tonsils and adenoids     Social History   Socioeconomic History  . Marital status: Single    Spouse name: Not on file  . Number of children: Not on file  . Years of education: Not on file  . Highest education level: Not on file  Occupational History  . Not on file  Social Needs  . Financial resource strain: Not on file  . Food insecurity:    Worry: Not on file    Inability: Not on file  . Transportation needs:    Medical: Not on file    Non-medical: Not on file  Tobacco Use  . Smoking status: Former Smoker    Packs/day: 0.25    Years:  1.00    Pack years: 0.25    Types: E-cigarettes    Last attempt to quit: 06/28/2015    Years since quitting: 2.8  . Smokeless tobacco: Never Used  . Tobacco comment: vaped in past  Substance and Sexual Activity  . Alcohol use: Not Currently    Comment: rare  . Drug use: No  . Sexual activity: Yes    Birth control/protection: Pill  Lifestyle  . Physical activity:    Days per week: Not on file    Minutes per session: Not on file  . Stress: Not on file  Relationships  . Social connections:    Talks on phone: Not on file    Gets together: Not on file    Attends religious service: Not on file    Active member of club or organization: Not on file    Attends meetings of clubs or organizations: Not on file    Relationship status: Not on file  Other Topics Concern  . Not on file  Social History Narrative   WORKS AT [employer]. WANTS TO GO TO SCHOOL FOR NURSING NEXT SPRING.   Family History  Problem Relation Age of Onset  . Diabetes  Mother   . Hypertension Mother   . Diabetes Father   . Hypertension Father   . Colon cancer Neg Hx   . Colon polyps Neg Hx   . Inflammatory bowel disease Neg Hx    Outpatient Encounter Medications as of 05/02/2018  Medication Sig  . calcium carbonate (TUMS) 500 MG chewable tablet Chew 1 tablet (200 mg of elemental calcium total) by mouth 3 (three) times daily.  . cholecalciferol (VITAMIN D) 1000 units tablet Take 1 tablet (1,000 Units total) by mouth daily. START AFTER VITAMIN D 50,000 U TABLETS ARE COMPLETE  . Cholecalciferol (VITAMIN D3) 125 MCG (5000 UT) CAPS Take 1 capsule (5,000 Units total) by mouth daily.  . clobetasol cream (TEMOVATE) 0.05 % Apply 1 application topically 2 (two) times daily.  . drospirenone-ethinyl estradiol (YASMIN,ZARAH,SYEDA) 3-0.03 MG tablet Take 1 tablet by mouth daily.  . folic acid (FOLVITE) 400 MCG tablet Take 400 mcg by mouth daily.  Marland Kitchen levocetirizine (XYZAL) 5 MG tablet Take 2.5-5 mg by mouth every evening.   . Multiple  Vitamins-Minerals (AIRBORNE PO) Take 1 tablet by mouth daily as needed (immune support).   . Prenatal Vit-Fe Fumarate-FA (PREPLUS) 27-1 MG TABS   . Vitamin D, Ergocalciferol, (DRISDOL) 50000 units CAPS capsule Take 1 capsule (50,000 Units total) by mouth every 7 (seven) days. (Patient not taking: Reported on 05/02/2018)  . [DISCONTINUED] cephALEXin (KEFLEX) 250 MG capsule Take 250 mg by mouth 3 (three) times daily.  . [DISCONTINUED] diphenoxylate-atropine (LOMOTIL) 2.5-0.025 MG tablet Take 1 tablet by mouth 4 (four) times daily as needed for diarrhea or loose stools.  . [DISCONTINUED] ondansetron (ZOFRAN ODT) 4 MG disintegrating tablet Take 1 tablet (4 mg total) by mouth every 8 (eight) hours as needed for nausea or vomiting. (Patient not taking: Reported on 10/31/2017)   No facility-administered encounter medications on file as of 05/02/2018.    ALLERGIES: Allergies  Allergen Reactions  . Bactrim [Sulfamethoxazole-Trimethoprim] Hives  . Benadryl [Diphenhydramine] Hives  . Dilaudid [Hydromorphone Hcl] Hives    VACCINATION STATUS:  There is no immunization history on file for this patient.  HPI Shannon Bentley is 28 y.o. female who presents today with a medical history as above. she is being seen in consultation for vitamin D deficiency requested by Kirstie Peri, MD.  She is a patient in remission from Crohn's disease requiring multiple modalities of therapy in the past.  She was found to have 25-hydroxy vitamin D of 21 in September 2019, on maintenance treatment with vitamin D3 1000 units daily slightly improving vitamin D to 23 in January 2020. -She does not have any acute concerns today.  No bone pains, fractures. -She does not have family history of osteoporosis.  She is not a vegetarian, consumes adequate dairy products as well as meat.  -She is on calcium carbonate 500 mg 3 times daily with meals.  Review of Systems  Constitutional: + Progressive weight gain,  no fatigue, no  subjective hyperthermia, no subjective hypothermia Eyes: no blurry vision, no xerophthalmia ENT: no sore throat, no nodules palpated in throat, no dysphagia/odynophagia, no hoarseness Cardiovascular: no Chest Pain, no Shortness of Breath, no palpitations, no leg swelling Respiratory: no cough, no shortness of breath Gastrointestinal: no Nausea/Vomiting/Diarhhea Musculoskeletal: no muscle/joint aches Skin: no rashes Neurological: no tremors, no numbness, no tingling, no dizziness Psychiatric: no depression, no anxiety  Objective:    BP 132/88   Pulse 92   Ht 5\' 1"  (1.549 m)   Wt 272 lb (123.4 kg)   BMI  51.39 kg/m   Wt Readings from Last 3 Encounters:  05/02/18 272 lb (123.4 kg)  10/31/17 269 lb 12.8 oz (122.4 kg)  10/12/17 245 lb (111.1 kg)    Physical Exam  Constitutional: + Obese for height, not in acute distress, normal state of mind Eyes: PERRLA, EOMI, no exophthalmos ENT: moist mucous membranes, no gross thyromegaly, no gross cervical lymphadenopathy Cardiovascular: normal precordial activity, Regular Rate and Rhythm, no Murmur/Rubs/Gallops Respiratory:  adequate breathing efforts, no gross chest deformity, Clear to auscultation bilaterally Gastrointestinal: abdomen soft, Non -tender, No distension, Bowel Sounds present, no gross organomegaly Musculoskeletal: no gross deformities, strength intact in all four extremities Skin: moist, warm, no rashes Neurological: no tremor with outstretched hands, Deep tendon reflexes normal in bilateral lower extremities.  CMP ( most recent) CMP     Component Value Date/Time   NA 138 10/12/2017 2211   K 3.7 10/12/2017 2211   CL 107 10/12/2017 2211   CO2 24 10/12/2017 2211   GLUCOSE 129 (H) 10/12/2017 2211   BUN 15 10/12/2017 2211   CREATININE 0.80 10/12/2017 2211   CALCIUM 8.8 (L) 10/12/2017 2211   PROT 7.5 10/12/2017 2211   ALBUMIN 3.6 10/12/2017 2211   AST 17 10/12/2017 2211   ALT 13 10/12/2017 2211   ALKPHOS 68 10/12/2017  2211   BILITOT 0.5 10/12/2017 2211   GFRNONAA >60 10/12/2017 2211   GFRAA >60 10/12/2017 2211   November 14, 2017 vitamin D 21.5, March 06, 2018 vitamin D 23     Assessment & Plan:   1. Vitamin D deficiency -Her most recent vitamin D is 45 on March 06, 2018.  She will benefit from additional vitamin D3 prescription for 5000 units for the next 90 days on top of her ongoing supplement with vitamin D3 1000 units daily.  She will need repeat labs in 4 months with office visit.  2. Morbid obesity (HCC) -She is not particularly concerned about her heavy weight, however this is her major health problem.  I will add hemoglobin A1c, CMP, thyroid function tests to her next labs.  - I advised her  to maintain close follow up with Kirstie Peri, MD for primary care needs.   - Time spent with the patient: 45 minutes, of which >50% was spent in obtaining information about her symptoms, reviewing her previous labs/studies,  evaluations, and treatments, counseling her about her vitamin D deficiency, and developing a plan to confirm the diagnosis and long term treatment based on the latest standards of care/guidelines.    Shannon Bentley participated in the discussions, expressed understanding, and voiced agreement with the above plans.  All questions were answered to her satisfaction. she is encouraged to contact clinic should she have any questions or concerns prior to her return visit.  Follow up plan: Return in about 4 months (around 09/01/2018) for Follow up with Pre-visit Labs.   Marquis Lunch, MD [facility] [address] [place], [place] [address] Phone: [phone]  Fax: [phone]     05/02/2018, 2:02 PM  This note was partially dictated with voice recognition software. Similar sounding words can be transcribed inadequately or may not  be corrected upon review.

## 2018-05-14 ENCOUNTER — Telehealth: Payer: Self-pay | Admitting: Gastroenterology

## 2018-05-14 NOTE — Telephone Encounter (Signed)
May recall for liver panel

## 2018-05-15 ENCOUNTER — Other Ambulatory Visit: Payer: Self-pay

## 2018-05-15 DIAGNOSIS — K50819 Crohn's disease of both small and large intestine with unspecified complications: Secondary | ICD-10-CM

## 2018-05-15 NOTE — Telephone Encounter (Signed)
Pt needs a CBC, CMP, AND FECAL CALPROTECTIN.

## 2018-05-15 NOTE — Telephone Encounter (Signed)
Dr. Darrick Penna, please see phone note of 07/10/2017 and advise which test you want other than the liver panel.

## 2018-05-15 NOTE — Telephone Encounter (Signed)
Labs on file for 07/05/2018.

## 2018-05-18 ENCOUNTER — Other Ambulatory Visit: Payer: Self-pay

## 2018-05-18 ENCOUNTER — Encounter (HOSPITAL_COMMUNITY): Payer: Self-pay | Admitting: Emergency Medicine

## 2018-05-18 ENCOUNTER — Emergency Department (HOSPITAL_COMMUNITY)
Admission: EM | Admit: 2018-05-18 | Discharge: 2018-05-18 | Disposition: A | Discharge status: Home / Self Care | Payer: 59 | Attending: Emergency Medicine | Admitting: Emergency Medicine

## 2018-05-18 DIAGNOSIS — Z79899 Other long term (current) drug therapy: Secondary | ICD-10-CM | POA: Insufficient documentation

## 2018-05-18 DIAGNOSIS — Z87891 Personal history of nicotine dependence: Secondary | ICD-10-CM | POA: Diagnosis not present

## 2018-05-18 DIAGNOSIS — K625 Hemorrhage of anus and rectum: Secondary | ICD-10-CM | POA: Diagnosis not present

## 2018-05-18 NOTE — ED Notes (Signed)
ED Provider at bedside. 

## 2018-05-18 NOTE — Discharge Instructions (Addendum)
Your vital signs are well within normal limits.  The examination of your rectal area reveals a skin tear and an area of increased redness at the external portion of the rectum.  Please use a Tucks pad 3-4 times daily to the affected area.  Please use the stool softener of your choice over the weekend.  Please increase fluids.  The bleeding seems to have ceased after applying pressure in the emergency department.  Return to the emergency department if the bleeding will not stop, or you notice other complications.

## 2018-05-18 NOTE — ED Triage Notes (Signed)
Pt reports bright red rectal bleeding with bowel movements "for a day".

## 2018-05-18 NOTE — ED Provider Notes (Signed)
[facility] EMERGENCY DEPARTMENT Provider Note   CSN: [id number] Arrival date & time: 05/18/18  0745    History   Chief Complaint Chief Complaint  Patient presents with  . Rectal Bleeding    HPI Shannon Bentley is a 28 y.o. female.     Patient is a 28 year old female who presents to the emergency department with a complaint of bright red rectal bleeding.  The patient states that this is been going on since yesterday, March 26.  She notices that when she wipes, and also notices some blood on her stool.  No complaint of fever or chills reported.  No nausea or vomiting recently.  The patient denies any recent constipation.  She has not had any injury or trauma to the rectal area.  Patient states that she has a history of Crohn's disease, but does not feel she has had a recent flareup.  She is concerned if the bleeding is related to her Crohn's issue.  The patient denies being on any anticoagulation medications.  No recent operations or procedures involving the rectal area.  The history is provided by the patient.  Rectal Bleeding  Quality:  Bright red Associated symptoms: no abdominal pain, no dizziness and no epistaxis     Past Medical History:  Diagnosis Date  . Anemia   . Crohn's disease (HCC)   . GERD (gastroesophageal reflux disease)   . Sleep apnea     Patient Active Problem List   Diagnosis Date Noted  . Vitamin D deficiency 05/02/2018  . Morbid obesity (HCC) 05/02/2018  . Diarrhea   . Crohn's disease of both small and large intestine (HCC) 05/02/2017    Past Surgical History:  Procedure Laterality Date  . APPENDECTOMY    . BIOPSY  07/03/2017   Procedure: BIOPSY;  Surgeon: West Bali, MD;  Location: [facility];  Service: Endoscopy;;  ileum random colon  . COLONOSCOPY  07/2015   Dr. Teena Dunk: evidence of prior surgical anastomosis, multiple biopsies. 2 small ulcers at anastomosis. TI and remaining colon normal, without evidence of IBD. Path with acute colitis  with reactive changes, features of Crohn's not specifically identified. non-specific. differentials including infection, drug effects, self-limited colitis  . COLONOSCOPY WITH PROPOFOL N/A 07/03/2017   Procedure: COLONOSCOPY WITH PROPOFOL;  Surgeon: West Bali, MD;  Location: [facility];  Service: Endoscopy;  Laterality: N/A;  8:30am  . EXPLORATORY LAPAROTOMY  05/2015   Dr. 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 path with crohn's disease involving both ileum and cecum  . TONSILLECTOMY    . tonsils and adenoids       OB History   No obstetric history on file.      Home Medications    Prior to Admission medications   Medication Sig Start Date End Date Taking? Authorizing Provider  calcium carbonate (TUMS) 500 MG chewable tablet Chew 1 tablet (200 mg of elemental calcium total) by mouth 3 (three) times daily. 10/31/17   Fields, Darleene Cleaver, MD  cholecalciferol (VITAMIN D) 1000 units tablet Take 1 tablet (1,000 Units total) by mouth daily. START AFTER VITAMIN D 50,000 U TABLETS ARE COMPLETE 11/15/17   Fields, Darleene Cleaver, MD  Cholecalciferol (VITAMIN D3) 125 MCG (5000 UT) CAPS Take 1 capsule (5,000 Units total) by mouth daily. 05/02/18   Roma Kayser, MD  clobetasol cream (TEMOVATE) 0.05 % Apply 1 application topically 2 (two) times daily.    [provider]  drospirenone-ethinyl estradiol (YASMIN,ZARAH,SYEDA) 3-0.03 MG tablet Take 1 tablet by mouth daily.    [provider]  folic acid (FOLVITE) 400 MCG tablet Take 400 mcg by mouth daily.    [provider]  levocetirizine (XYZAL) 5 MG tablet Take 2.5-5 mg by mouth every evening.     [provider]  Multiple Vitamins-Minerals (AIRBORNE PO) Take 1 tablet by mouth daily as needed (immune support).     [provider]  Prenatal Vit-Fe Fumarate-FA (PREPLUS) 27-1 MG TABS  10/04/17   [provider]  Vitamin D, Ergocalciferol, (DRISDOL) 50000 units CAPS capsule Take 1 capsule (50,000 Units total) by mouth every 7 (seven) days. Patient not taking: Reported on 05/02/2018 11/15/17   West Bali, MD    Family History Family History  Problem Relation Age of Onset  . Diabetes Mother   . Hypertension Mother   . Diabetes Father   . Hypertension Father   . Colon cancer Neg Hx   . Colon polyps Neg Hx   . Inflammatory bowel disease Neg Hx     Social History Social History   Tobacco Use  . Smoking status: Former Smoker    Packs/day: 0.25    Years: 1.00    Pack years: 0.25    Types: E-cigarettes    Last attempt to quit: 06/28/2015    Years since quitting: 2.8  . Smokeless tobacco: Never Used  . Tobacco comment: vaped in past  Substance Use Topics  . Alcohol use: Not Currently    Comment: rare  . Drug use: No     Allergies   Bactrim [sulfamethoxazole-trimethoprim]; Benadryl [diphenhydramine]; and Dilaudid [hydromorphone hcl]   Review of Systems Review of Systems  Constitutional: Negative for activity change.       All ROS Neg except as noted in HPI  HENT: Negative for nosebleeds.   Eyes: Negative for photophobia and discharge.  Respiratory: Negative for cough, shortness of breath and wheezing.   Cardiovascular: Negative for chest pain and palpitations.  Gastrointestinal: Positive for hematochezia. Negative for abdominal pain and blood in stool.  Genitourinary: Negative for dysuria, frequency and hematuria.  Musculoskeletal: Negative for arthralgias, back pain and neck pain.  Skin: Negative.   Neurological: Negative for dizziness, seizures and speech difficulty.  Psychiatric/Behavioral: Negative for confusion and hallucinations.     Physical Exam Updated Vital Signs BP 129/89 (BP Location: Right Arm)   Pulse 71   Temp 98.4 F (36.9 C) (Oral)   Resp 15   Ht 5\' 1"  (1.549 m)   Wt 124 kg   LMP 04/12/2018   SpO2 100%   BMI 51.65 kg/m   Physical  Exam Vitals signs and nursing note reviewed. Exam conducted with a chaperone present.  Constitutional:      Appearance: She is well-developed. She is not toxic-appearing.  HENT:     Head: Normocephalic.     Right Ear: Tympanic membrane and external ear normal.     Left Ear: Tympanic membrane and external ear normal.  Eyes:     General: Lids are normal.     Pupils: Pupils are equal, round, and reactive to light.  Neck:     Musculoskeletal: Normal range of motion and neck supple.     Vascular: No carotid bruit.  Cardiovascular:     Rate and Rhythm: Normal rate and regular rhythm.     Pulses: Normal pulses.     Heart sounds: Normal heart sounds.  Pulmonary:     Effort:  No respiratory distress.     Breath sounds: Normal breath sounds.  Abdominal:     General: Bowel sounds are normal.     Palpations: Abdomen is soft.     Tenderness: There is no abdominal tenderness. There is no guarding.  Genitourinary:    Exam position: Supine.     Rectum: Tenderness present.     Comments: Patient has a small denuded area of the anal area with active bleeding.  There are also some skin tags present, but no bleeding from the skin tag area.  No mass involving the sphincter.  No evidence of fistula or other problems. Musculoskeletal: Normal range of motion.  Lymphadenopathy:     Head:     Right side of head: No submandibular adenopathy.     Left side of head: No submandibular adenopathy.     Cervical: No cervical adenopathy.  Skin:    General: Skin is warm and dry.  Neurological:     Mental Status: She is alert and oriented to person, place, and time.     Cranial Nerves: No cranial nerve deficit.     Sensory: No sensory deficit.  Psychiatric:        Speech: Speech normal.      ED Treatments / Results  Labs (all labs ordered are listed, but only abnormal results are displayed) Labs Reviewed - No data to display  EKG None  Radiology No results found.  Procedures Procedures (including  critical care time)  Medications Ordered in ED Medications - No data to display   Initial Impression / Assessment and Plan / ED Course  I have reviewed the triage vital signs and the nursing notes.  Pertinent labs & imaging results that were available during my care of the patient were reviewed by me and considered in my medical decision making (see chart for details).          Final Clinical Impressions(s) / ED Diagnoses MDM  Vital signs reviewed.  Pulse oximetry is 100% on room air.  Within normal limits by my interpretation.  The patient has a denuded area of the anus with active bleeding.  Pressure was applied with a moist gauze.  The bleeding was stopped.  No other rectal mass.  No mass involving the sphincter noted.  I have asked the patient to use Tucks pads.  I have also asked her to use stool softener and increase fluids.  She is to return if the bleeding worsens, any fever or chills, problems, or concerns.  Patient is in agreement with this plan.   Final diagnoses:  Rectal bleeding    ED Discharge Orders    None       Ivery Quale, PA-C 05/19/18 1109    Pricilla Loveless, MD 05/19/18 1121

## 2018-05-23 ENCOUNTER — Other Ambulatory Visit: Payer: Self-pay

## 2018-05-23 DIAGNOSIS — K50819 Crohn's disease of both small and large intestine with unspecified complications: Secondary | ICD-10-CM

## 2018-06-25 ENCOUNTER — Telehealth: Payer: Self-pay | Admitting: Gastroenterology

## 2018-06-25 NOTE — Telephone Encounter (Signed)
ON RECALL FOR LABS

## 2018-06-25 NOTE — Telephone Encounter (Signed)
Letter was mailed for lab work on 05/23/18.

## 2018-07-01 ENCOUNTER — Telehealth: Payer: Self-pay

## 2018-07-01 DIAGNOSIS — E559 Vitamin D deficiency, unspecified: Secondary | ICD-10-CM

## 2018-07-01 MED ORDER — VITAMIN D3 125 MCG (5000 UT) PO CAPS: 5000.0000 [IU] | ORAL_CAPSULE | Freq: Every day | ORAL | 0 refills | Status: DC

## 2018-07-01 NOTE — Telephone Encounter (Signed)
LeighAnn Ashby [place], CMA  

## 2018-07-04 ENCOUNTER — Other Ambulatory Visit (HOSPITAL_COMMUNITY)
Admission: RE | Admit: 2018-07-04 | Discharge: 2018-07-04 | Disposition: A | Discharge status: Home / Self Care | Payer: 59 | Source: Ambulatory Visit | Attending: Gastroenterology | Admitting: Gastroenterology

## 2018-07-04 ENCOUNTER — Other Ambulatory Visit: Payer: Self-pay

## 2018-07-04 DIAGNOSIS — K50819 Crohn's disease of both small and large intestine with unspecified complications: Secondary | ICD-10-CM | POA: Diagnosis present

## 2018-07-04 LAB — CBC WITH DIFFERENTIAL/PLATELET
Abs Immature Granulocytes: 0.01 10*3/uL (ref 0.00–0.07)
Basophils Absolute: 0 10*3/uL (ref 0.0–0.1)
Basophils Relative: 1 %
Eosinophils Absolute: 0.1 10*3/uL (ref 0.0–0.5)
Eosinophils Relative: 1 %
HCT: 38 % (ref 36.0–46.0)
Hemoglobin: 12.2 g/dL (ref 12.0–15.0)
Immature Granulocytes: 0 %
Lymphocytes Relative: 34 %
Lymphs Abs: 2.9 10*3/uL (ref 0.7–4.0)
MCH: 26.9 pg (ref 26.0–34.0)
MCHC: 32.1 g/dL (ref 30.0–36.0)
MCV: 83.9 fL (ref 80.0–100.0)
Monocytes Absolute: 0.6 10*3/uL (ref 0.1–1.0)
Monocytes Relative: 7 %
Neutro Abs: 4.9 10*3/uL (ref 1.7–7.7)
Neutrophils Relative %: 57 %
Platelets: 372 10*3/uL (ref 150–400)
RBC: 4.53 MIL/uL (ref 3.87–5.11)
RDW: 13.8 % (ref 11.5–15.5)
WBC: 8.4 10*3/uL (ref 4.0–10.5)
nRBC: 0 % (ref 0.0–0.2)

## 2018-07-04 LAB — COMPREHENSIVE METABOLIC PANEL
ALT: 21 U/L (ref 0–44)
AST: 26 U/L (ref 15–41)
Albumin: 3.6 g/dL (ref 3.5–5.0)
Alkaline Phosphatase: 68 U/L (ref 38–126)
Anion gap: 9 (ref 5–15)
BUN: 11 mg/dL (ref 6–20)
CO2: 24 mmol/L (ref 22–32)
Calcium: 9 mg/dL (ref 8.9–10.3)
Chloride: 103 mmol/L (ref 98–111)
Creatinine, Ser: 0.63 mg/dL (ref 0.44–1.00)
GFR calc Af Amer: >60 mL/min (ref >60)
GFR calc non Af Amer: >60 mL/min (ref >60)
Glucose, Bld: 108 mg/dL — ABNORMAL HIGH (ref 70–99)
Potassium: 3.8 mmol/L (ref 3.5–5.1)
Sodium: 136 mmol/L (ref 135–145)
Total Bilirubin: 0.3 mg/dL (ref 0.3–1.2)
Total Protein: 7.2 g/dL (ref 6.5–8.1)

## 2018-07-06 ENCOUNTER — Emergency Department (HOSPITAL_COMMUNITY)
Admission: EM | Admit: 2018-07-06 | Discharge: 2018-07-07 | Disposition: A | Discharge status: Home / Self Care | Payer: 59 | Attending: Emergency Medicine | Admitting: Emergency Medicine

## 2018-07-06 ENCOUNTER — Other Ambulatory Visit: Payer: Self-pay

## 2018-07-06 ENCOUNTER — Encounter (HOSPITAL_COMMUNITY): Payer: Self-pay | Admitting: *Deleted

## 2018-07-06 DIAGNOSIS — Z79899 Other long term (current) drug therapy: Secondary | ICD-10-CM | POA: Diagnosis not present

## 2018-07-06 DIAGNOSIS — Z87891 Personal history of nicotine dependence: Secondary | ICD-10-CM | POA: Diagnosis not present

## 2018-07-06 DIAGNOSIS — R112 Nausea with vomiting, unspecified: Secondary | ICD-10-CM | POA: Diagnosis present

## 2018-07-06 DIAGNOSIS — K5090 Crohn's disease, unspecified, without complications: Secondary | ICD-10-CM | POA: Diagnosis not present

## 2018-07-06 LAB — CBC WITH DIFFERENTIAL/PLATELET
Abs Immature Granulocytes: 0.06 10*3/uL (ref 0.00–0.07)
Basophils Absolute: 0.1 10*3/uL (ref 0.0–0.1)
Basophils Relative: 0 %
Eosinophils Absolute: 0.1 10*3/uL (ref 0.0–0.5)
Eosinophils Relative: 0 %
HCT: 39.7 % (ref 36.0–46.0)
Hemoglobin: 12.5 g/dL (ref 12.0–15.0)
Immature Granulocytes: 0 %
Lymphocytes Relative: 15 %
Lymphs Abs: 2.8 10*3/uL (ref 0.7–4.0)
MCH: 26.4 pg (ref 26.0–34.0)
MCHC: 31.5 g/dL (ref 30.0–36.0)
MCV: 83.8 fL (ref 80.0–100.0)
Monocytes Absolute: 1.3 10*3/uL — ABNORMAL HIGH (ref 0.1–1.0)
Monocytes Relative: 7 %
Neutro Abs: 14.3 10*3/uL — ABNORMAL HIGH (ref 1.7–7.7)
Neutrophils Relative %: 78 %
Platelets: 385 10*3/uL (ref 150–400)
RBC: 4.74 MIL/uL (ref 3.87–5.11)
RDW: 13.8 % (ref 11.5–15.5)
WBC: 18.5 10*3/uL — ABNORMAL HIGH (ref 4.0–10.5)
nRBC: 0 % (ref 0.0–0.2)

## 2018-07-06 MED ORDER — ONDANSETRON HCL 4 MG/2ML IJ SOLN
4.0000 mg | Freq: Once | INTRAMUSCULAR | Status: AC
  Administered 2018-07-06: 4 mg via INTRAVENOUS
  Filled 2018-07-06: qty 2

## 2018-07-06 MED ORDER — SODIUM CHLORIDE 0.9 % IV BOLUS
1000.0000 mL | Freq: Once | INTRAVENOUS | Status: AC
  Administered 2018-07-06: 1000 mL via INTRAVENOUS

## 2018-07-06 NOTE — ED Triage Notes (Signed)
Pt with abd pain starting after eating some seafood tonight, emesis x 4 and diarrhea x 4-5

## 2018-07-06 NOTE — ED Provider Notes (Signed)
[facility] EMERGENCY DEPARTMENT Provider Note   CSN: [id number] Arrival date & time: 07/06/18  2249    History   Chief Complaint Chief Complaint  Patient presents with   Abdominal Pain    HPI Shannon Bentley is a 28 y.o. female.     28 year old with history of obesity, Crohn's disease, acid reflux disease, not on any medications for Crohn's presenting with nausea, vomiting, abdominal pain and diarrhea with onset tonight about 9:30 PM.  Patient states she ate shrimp and scallops around 630 and she was feeling well prior to that.  She went to take a nap and woke up with "indigestion" with nausea and vomiting abdominal cramping and diarrhea.  Reports 4-5 episodes of vomiting and 4-5 episodes of diarrhea.  Neither have been bloody.  Reports diffuse abdominal cramping that comes and goes.  No fevers.  No pain with urination or blood in the urine.  No vaginal bleeding or discharge.  No sick contacts.  Previous appendectomy.  Still has gallbladder.  Reports her Crohn's disease was diagnosed at the time of her appendectomy by Dr. Oneida Alar told her that she never needed to be on medications for it.   Abdominal Pain  Associated symptoms: diarrhea, nausea and vomiting   Associated symptoms: no cough, no dysuria, no hematuria and no shortness of breath     Past Medical History:  Diagnosis Date   Anemia    Crohn's disease ([place])    GERD (gastroesophageal reflux disease)    Sleep apnea     Patient Active Problem List   Diagnosis Date Noted   Vitamin D deficiency 05/02/2018   Morbid obesity ([place]) 05/02/2018   Diarrhea    Crohn's disease of both small and large intestine ([place]) 05/02/2017    Past Surgical History:  Procedure Laterality Date   APPENDECTOMY     BIOPSY  07/03/2017   Procedure: BIOPSY;  Surgeon: Danie Binder, MD;  [facility];  Service: Endoscopy;;  ileum random colon   COLONOSCOPY  07/2015   Dr. Britta Mccreedy: evidence of prior surgical anastomosis,  multiple biopsies. 2 small ulcers at anastomosis. TI and remaining colon normal, without evidence of IBD. Path with acute colitis with reactive changes, features of Crohn's not specifically identified. non-specific. differentials including infection, drug effects, self-limited colitis   COLONOSCOPY WITH PROPOFOL N/A 07/03/2017   Procedure: COLONOSCOPY WITH PROPOFOL;  Surgeon: Danie Binder, MD;  [facility];  Service: Endoscopy;  Laterality: N/A;  8:30am   EXPLORATORY LAPAROTOMY  05/2015   Dr. 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 path with crohn's disease involving both ileum and cecum   TONSILLECTOMY     tonsils and adenoids       OB History   No obstetric history on file.      Home Medications    Prior to Admission medications   Medication Sig Start Date End Date Taking? Authorizing Provider  calcium carbonate (TUMS) 500 MG chewable tablet Chew 1 tablet (200 mg of elemental calcium total) by mouth 3 (three) times daily. 10/31/17   Fields, Marga Melnick, MD  cholecalciferol (VITAMIN D) 1000 units tablet Take 1 tablet (1,000 Units total) by mouth daily. START AFTER VITAMIN D 50,000 U TABLETS ARE COMPLETE 11/15/17   Fields, Marga Melnick, MD  Cholecalciferol (VITAMIN D3) 125 MCG (5000 UT) CAPS Take 1 capsule (5,000 Units total) by mouth daily. 07/01/18   Cassandria Anger, MD  clobetasol  cream (TEMOVATE) 4.62 % Apply 1 application topically 2 (two) times daily.    [provider]  drospirenone-ethinyl estradiol (YASMIN,ZARAH,SYEDA) 3-0.03 MG tablet Take 1 tablet by mouth daily.    [provider]  folic acid (FOLVITE) 703 MCG tablet Take 400 mcg by mouth daily.    [provider]  levocetirizine (XYZAL) 5 MG tablet Take 2.5-5 mg by mouth every evening.     [provider]  Multiple Vitamins-Minerals (AIRBORNE PO) Take 1 tablet by mouth daily as  needed (immune support).     [provider]  Prenatal Vit-Fe Fumarate-FA (PREPLUS) 27-1 MG TABS  10/04/17   [provider]  Vitamin D, Ergocalciferol, (DRISDOL) 50000 units CAPS capsule Take 1 capsule (50,000 Units total) by mouth every 7 (seven) days. Patient not taking: Reported on 05/02/2018 11/15/17   Danie Binder, MD    Family History Family History  Problem Relation Age of Onset   Diabetes Mother    Hypertension Mother    Diabetes Father    Hypertension Father    Colon cancer Neg Hx    Colon polyps Neg Hx    Inflammatory bowel disease Neg Hx     Social History Social History   Tobacco Use   Smoking status: Former Smoker    Packs/day: 0.25    Years: 1.00    Pack years: 0.25    Types: E-cigarettes    Last attempt to quit: 06/28/2015    Years since quitting: 3.0   Smokeless tobacco: Never Used   Tobacco comment: vaped in past  Substance Use Topics   Alcohol use: Not Currently    Comment: rare   Drug use: No     Allergies   Bactrim [sulfamethoxazole-trimethoprim]; Benadryl [diphenhydramine]; and Dilaudid [hydromorphone hcl]   Review of Systems Review of Systems  Constitutional: Positive for appetite change. Negative for activity change.  HENT: Negative for congestion and rhinorrhea.   Eyes: Negative for visual disturbance.  Respiratory: Negative for cough, chest tightness and shortness of breath.   Gastrointestinal: Positive for abdominal pain, diarrhea, nausea and vomiting.  Genitourinary: Negative for dysuria and hematuria.  Musculoskeletal: Negative for arthralgias, back pain and myalgias.  Neurological: Negative for dizziness, weakness and headaches.     all other systems are negative except as noted in the HPI and PMH.   Physical Exam Updated Vital Signs BP 105/68 (BP Location: Right Arm)    Pulse (!) 106    Temp 98.1 F (36.7 C) (Oral)    Resp 20    Ht 5' 1"  (1.549 m)    Wt 118.8 kg    LMP 06/29/2018    SpO2 96%    BMI  49.50 kg/m   Physical Exam Vitals signs and nursing note reviewed.  Constitutional:      General: She is not in acute distress.    Appearance: She is well-developed. She is obese.  HENT:     Head: Normocephalic and atraumatic.     Mouth/Throat:     Pharynx: No oropharyngeal exudate.  Eyes:     Conjunctiva/sclera: Conjunctivae normal.     Pupils: Pupils are equal, round, and reactive to light.  Neck:     Musculoskeletal: Normal range of motion and neck supple.     Comments: No meningismus. Cardiovascular:     Rate and Rhythm: Normal rate and regular rhythm.     Heart sounds: Normal heart sounds. No murmur.  Pulmonary:     Effort: Pulmonary effort is normal. No respiratory  distress.     Breath sounds: Normal breath sounds.  Abdominal:     Palpations: Abdomen is soft.     Tenderness: There is abdominal tenderness. There is no guarding or rebound.     Comments: Diffuse tenderness, worse in the left side, no guarding or rebound  Musculoskeletal: Normal range of motion.        General: No tenderness.     Comments: No CVA tenderness  Skin:    General: Skin is warm.     Capillary Refill: Capillary refill takes less than 2 seconds.  Neurological:     General: No focal deficit present.     Mental Status: She is alert and oriented to person, place, and time. Mental status is at baseline.     Cranial Nerves: No cranial nerve deficit.     Motor: No abnormal muscle tone.     Coordination: Coordination normal.     Comments: No ataxia on finger to nose bilaterally. No pronator drift. 5/5 strength throughout. CN 2-12 intact.Equal grip strength. Sensation intact.   Psychiatric:        Behavior: Behavior normal.      ED Treatments / Results  Labs (all labs ordered are listed, but only abnormal results are displayed) Labs Reviewed  URINALYSIS, ROUTINE W REFLEX MICROSCOPIC - Abnormal; Notable for the following components:      Result Value   Specific Gravity, Urine 1.042 (*)    All  other components within normal limits  CBC WITH DIFFERENTIAL/PLATELET - Abnormal; Notable for the following components:   WBC 18.5 (*)    Neutro Abs 14.3 (*)    Monocytes Absolute 1.3 (*)    All other components within normal limits  COMPREHENSIVE METABOLIC PANEL - Abnormal; Notable for the following components:   Glucose, Bld 127 (*)    All other components within normal limits  PREGNANCY, URINE  LIPASE, BLOOD    EKG None  Radiology Ct Abdomen Pelvis W Contrast  Result Date: 07/07/2018 CLINICAL DATA:  Crohn's exacerbation. Abdominal pain, onset today. Emesis and diarrhea. EXAM: CT ABDOMEN AND PELVIS WITH CONTRAST TECHNIQUE: Multidetector CT imaging of the abdomen and pelvis was performed using the standard protocol following bolus administration of intravenous contrast. CONTRAST:  178m OMNIPAQUE IOHEXOL 300 MG/ML  SOLN COMPARISON:  Radiograph 10/12/2017.  CT 05/13/2017 FINDINGS: Lower chest: No pleural fluid or consolidation. Hepatobiliary: No focal liver abnormality is seen. No gallstones, gallbladder wall thickening, or biliary dilatation. Pancreas: No ductal dilatation or inflammation. Spleen: Normal in size without focal abnormality. Adrenals/Urinary Tract: Normal adrenal glands. No hydronephrosis or perinephric edema. Homogeneous renal enhancement. Urinary bladder is partially distended without wall thickening. Stomach/Bowel: Stomach is nondistended. No gastric wall thickening. No evidence of obstruction. Small bowel fluid-filled diffusely but nondilated. Questionable mild wall thickening of small bowel loop in the left mid abdomen with adjacent mesenteric edema. Prior iliocecectomy. Liquid stool/fluid-filled ascending, transverse, and descending colon, but without wall thickening or surrounding stranding. No definite colonic wall thickening or inflammatory change. Vascular/Lymphatic: Circumaortic left renal vein. Normal caliber abdominal aorta. Patent portal and mesenteric veins. Small  ileocolic lymph nodes are likely reactive Reproductive: Uterus and bilateral adnexa are unremarkable. Other: Postsurgical changes the anterior abdominal wall. No significant free fluid. No free air. No intra-abdominal abscess. Musculoskeletal: There are no acute or suspicious osseous abnormalities. IMPRESSION: 1. Liquid stool throughout the colon but no inflammatory changes or wall thickening to suggest active Crohn's disease in the colon. 2. Fluid-filled nondilated and nonobstructed small bowel. Suggestion of mild  wall thickening and edema involving short-segment bowel loop in the left abdomen which may represent site of active Crohn's. Electronically Signed   By: Keith Rake M.D.   On: 07/07/2018 00:49    Procedures Procedures (including critical care time)  Medications Ordered in ED Medications  sodium chloride 0.9 % bolus 1,000 mL (has no administration in time range)  ondansetron (ZOFRAN) injection 4 mg (has no administration in time range)     Initial Impression / Assessment and Plan / ED Course  I have reviewed the triage vital signs and the nursing notes.  Pertinent labs & imaging results that were available during my care of the patient were reviewed by me and considered in my medical decision making (see chart for details).       Patient with history of Crohn's disease, vomiting, diarrhea and abdominal cramping after eating shrimp and scallops tonight.  She was well prior to this.  Patient given IV fluids and antiemetics.  She is mildly tachycardic.  Urinalysis is negative with no ketones hCG is negative.  Labs show leukocytosis of 18. Given her history of Crohn's disease and worsening symptoms will obtain CT scan.  CT shows liquid stool throughout the colon.  There is a small segment of small bowel with mild wall thickening and edema which could represent active Crohn's disease.  Patient with soft abdomen and tolerating p.o.  No vomiting or diarrhea throughout ED course.   Heart rate has normalized. CT results discussed with patient.  Risks and benefits of steroid course discussed and she wishes to proceed.  Advised p.o. hydration at home, symptom control, steroid course.  Call Dr. Oneida Alar on Monday to give her an update.  Return to the ED with worsening pain, fever, vomiting or other concerns. Final Clinical Impressions(s) / ED Diagnoses   Final diagnoses:  Nausea vomiting and diarrhea  Exacerbation of Crohn's disease without complication [facility])    ED Discharge Orders    None       Ezequiel Essex, MD 07/07/18 0134

## 2018-07-07 ENCOUNTER — Emergency Department (HOSPITAL_COMMUNITY): Payer: 59

## 2018-07-07 LAB — COMPREHENSIVE METABOLIC PANEL
ALT: 22 U/L (ref 0–44)
AST: 22 U/L (ref 15–41)
Albumin: 3.6 g/dL (ref 3.5–5.0)
Alkaline Phosphatase: 68 U/L (ref 38–126)
Anion gap: 10 (ref 5–15)
BUN: 12 mg/dL (ref 6–20)
CO2: 28 mmol/L (ref 22–32)
Calcium: 9.5 mg/dL (ref 8.9–10.3)
Chloride: 103 mmol/L (ref 98–111)
Creatinine, Ser: 0.78 mg/dL (ref 0.44–1.00)
GFR calc Af Amer: >60 mL/min (ref >60)
GFR calc non Af Amer: >60 mL/min (ref >60)
Glucose, Bld: 127 mg/dL — ABNORMAL HIGH (ref 70–99)
Potassium: 4.2 mmol/L (ref 3.5–5.1)
Sodium: 141 mmol/L (ref 135–145)
Total Bilirubin: 0.5 mg/dL (ref 0.3–1.2)
Total Protein: 7.5 g/dL (ref 6.5–8.1)

## 2018-07-07 LAB — URINALYSIS, ROUTINE W REFLEX MICROSCOPIC
Bilirubin Urine: NEGATIVE
Glucose, UA: NEGATIVE mg/dL
Hgb urine dipstick: NEGATIVE
Ketones, ur: NEGATIVE mg/dL
Leukocytes,Ua: NEGATIVE
Nitrite: NEGATIVE
Protein, ur: NEGATIVE mg/dL
Specific Gravity, Urine: 1.042 — ABNORMAL HIGH (ref 1.005–1.030)
pH: 5 (ref 5.0–8.0)

## 2018-07-07 LAB — LIPASE, BLOOD: Lipase: 22 U/L (ref 11–51)

## 2018-07-07 LAB — PREGNANCY, URINE: Preg Test, Ur: NEGATIVE

## 2018-07-07 MED ORDER — ONDANSETRON 4 MG PO TBDP: 4.0000 mg | ORAL_TABLET | Freq: Three times a day (TID) | ORAL | 0 refills | Status: DC | PRN

## 2018-07-07 MED ORDER — PREDNISONE 10 MG (21) PO TBPK: ORAL_TABLET | Freq: Every day | ORAL | 0 refills | Status: DC

## 2018-07-07 MED ORDER — IOHEXOL 300 MG/ML  SOLN
100.0000 mL | Freq: Once | INTRAMUSCULAR | Status: AC | PRN
  Administered 2018-07-07: 100 mL via INTRAVENOUS

## 2018-07-07 NOTE — Discharge Instructions (Signed)
Take the nausea medication and steroids as prescribed.  You may use Imodium as needed for diarrhea but do not take it more than 3 times a day.  Call Dr. Darrick Penna on Monday to let her know how you are doing and ask if she wants you to continue the prednisone.  Return to the ED with worsening pain, fevers, vomiting or other concerns.

## 2018-07-07 NOTE — ED Notes (Signed)
Pt tolerated fluid challenge well. 

## 2018-07-10 ENCOUNTER — Other Ambulatory Visit (HOSPITAL_COMMUNITY)
Admission: RE | Admit: 2018-07-10 | Discharge: 2018-07-10 | Disposition: A | Discharge status: Home / Self Care | Payer: 59 | Source: Ambulatory Visit | Attending: Gastroenterology | Admitting: Gastroenterology

## 2018-07-10 DIAGNOSIS — K50819 Crohn's disease of both small and large intestine with unspecified complications: Secondary | ICD-10-CM | POA: Insufficient documentation

## 2018-07-12 LAB — CALPROTECTIN, FECAL: Calprotectin, Fecal: 132 ug/g — ABNORMAL HIGH (ref 0–120)

## 2018-07-13 ENCOUNTER — Telehealth: Payer: Self-pay | Admitting: Gastroenterology

## 2018-07-13 DIAGNOSIS — K50118 Crohn's disease of large intestine with other complication: Secondary | ICD-10-CM

## 2018-07-13 NOTE — Telephone Encounter (Signed)
PLEASE CALL PT. HER STOOL TEST( FECAL CALPROTECTIN) IS ELEVATED. SHE SHOWS EVIDENCE OF ACTIVE CROHN'S DISEASE ON THE STOOL TEST AND THE CT SHE HAD . SHE SHOULD RE-START THERAPY. SHE REPORTS SHE DID NOT TOLERATE IMURAN OR REMICADE. THERE ARE OTHER THERAPIES. IF SHE DOES NOT GET TREATMENT SHE COULD END UP WITH STRICTURE, FISTULAS, OR SURGERIES IN THE FUTURE.  PRIOR TO STARTING ANOTHER BIOLOGIC AGENT SHE NEEDS: 1. BLOOD TESTS: HEP B SURFACE Ag, HEP B S IgM, TB ASSAY, MMR TITER, VZV TITER. 2. SHE SHOULD SEE HER PCP FOR THE PNEUMONIA VACCINE. 3. SHE NEEDS A VIRTUAL VISIT.

## 2018-07-16 NOTE — Telephone Encounter (Signed)
Pt is aware. Labs orders placed in mail for her. She said she had pneumonia shot when she got flu shot this year.

## 2018-07-16 NOTE — Telephone Encounter (Signed)
LMOM to call.

## 2018-07-17 ENCOUNTER — Encounter: Payer: Self-pay | Admitting: Gastroenterology

## 2018-07-17 NOTE — Telephone Encounter (Signed)
SCHEDULED AND LETTER SENT  °

## 2018-07-31 ENCOUNTER — Other Ambulatory Visit (HOSPITAL_COMMUNITY)
Admission: RE | Admit: 2018-07-31 | Discharge: 2018-07-31 | Disposition: A | Discharge status: Home / Self Care | Payer: 59 | Source: Ambulatory Visit | Attending: Gastroenterology | Admitting: Gastroenterology

## 2018-07-31 DIAGNOSIS — K50119 Crohn's disease of large intestine with unspecified complications: Secondary | ICD-10-CM | POA: Diagnosis present

## 2018-08-01 LAB — MEASLES/MUMPS/RUBELLA IMMUNITY
Mumps IgG: <9 AU/mL — ABNORMAL LOW (ref >10.9)
Rubella: 1.64 index (ref >0.99)
Rubeola IgG: <13.5 AU/mL — ABNORMAL LOW (ref >16.4)

## 2018-08-01 LAB — VARICELLA ZOSTER ANTIBODY, IGG: Varicella IgG: 575 index (ref >165)

## 2018-08-01 LAB — VITAMIN D 25 HYDROXY (VIT D DEFICIENCY, FRACTURES): Vit D, 25-Hydroxy: 29.5 ng/mL — ABNORMAL LOW (ref 30.0–100.0)

## 2018-08-01 LAB — HEPATITIS B CORE ANTIBODY, TOTAL: Hep B Core Total Ab: NEGATIVE

## 2018-08-01 LAB — HEPATITIS B SURFACE ANTIGEN: Hepatitis B Surface Ag: NEGATIVE

## 2018-08-04 LAB — QUANTIFERON-TB GOLD PLUS (RQFGPL)
QuantiFERON Mitogen Value: 6.65 IU/mL
QuantiFERON Nil Value: 0.03 IU/mL
QuantiFERON TB1 Ag Value: 0.02 IU/mL
QuantiFERON TB2 Ag Value: 0.02 IU/mL

## 2018-08-04 LAB — QUANTIFERON-TB GOLD PLUS: QuantiFERON-TB Gold Plus: NEGATIVE

## 2018-08-07 NOTE — Progress Notes (Signed)
LMOM to call.

## 2018-08-07 NOTE — Progress Notes (Signed)
Pt is aware.  

## 2018-08-08 NOTE — Progress Notes (Signed)
CC'D TO PCP °

## 2018-08-15 ENCOUNTER — Other Ambulatory Visit: Payer: Self-pay

## 2018-08-15 ENCOUNTER — Encounter: Payer: Self-pay | Admitting: Gastroenterology

## 2018-08-15 ENCOUNTER — Ambulatory Visit (INDEPENDENT_AMBULATORY_CARE_PROVIDER_SITE_OTHER): Payer: 59 | Admitting: Gastroenterology

## 2018-08-15 MED ORDER — MESALAMINE ER 500 MG PO CPCR: ORAL_CAPSULE | ORAL | 11 refills | Status: DC

## 2018-08-15 NOTE — Assessment & Plan Note (Signed)
WEIGHT UP SINCE LAST YEAR.  DRINK WATER TO KEEP YOUR URINE LIGHT YELLOW. Avoid processed foods and eating out. CONTINUE YOUR WEIGHT LOSS EFFORTS. YOUR BODY MASS INDEX IS OVER 50 WHICH MEANS YOU ARE SUPER MORBIDLY OBESE. OBESITY IS ASSOCIATED WITH AN INCREASED FOR CIRRHOSIS AND ALL CANCERS, INCLUDING ESOPHAGEAL AND COLON CANCER. TRANSITION TO A VEGETARIAN DIET OVER THE NEXT 6-12 MOS. SEE HANDOUT.   IF YOU FEEL LIKE YOU CANNOT LOSE THE WEIGHT ON YOUR OWN OVER THE NEXT 6-12 MOS THEN PLEASE CALL FOR A REFERRAL TO THE BARIATRIC WEIGHT LOSS CLINIC. FOLLOW UP IN 3 MOS.

## 2018-08-15 NOTE — Patient Instructions (Addendum)
DRINK WATER TO KEEP YOUR URINE LIGHT YELLOW.  Avoid processed foods and eating out.  CONTINUE YOUR WEIGHT LOSS EFFORTS. YOUR BODY MASS INDEX IS OVER 50 WHICH MEANS YOU ARE SUPER MORBIDLY OBESE. OBESITY IS ASSOCIATED WITH AN INCREASED FOR CIRRHOSIS AND ALL CANCERS, INCLUDING ESOPHAGEAL AND COLON CANCER. TRANSITION TO A VEGETARIAN DIET OVER THE NEXT 6-12 MOS. SEE HANDOUT.  SEE NUTRTION FOR ADDITIONAL INSTRUCTION. I AM MAKING THE REFERRAL.   IF YOU FEEL LIKE YOU CANNOT LOSE THE WEIGHT ON YOUR OWN OVER THE NEXT 6-12 MOS THEN PLEASE CALL FOR A REFERRAL TO THE BARIATRIC WEIGHT LOSS CLINIC.   TO TREAT CROHN'S ILEITIS, ADD PENTASA.   PLEASE CALL IN ONE MONTH IF SYMPTOMS ARE NOT IMPROVED.   FOLLOW UP IN 3 MOS.

## 2018-08-15 NOTE — Progress Notes (Signed)
Subjective:    Patient ID: Shannon Bentley, female    DOB: Jun 26, 1990, 28 y.o.   MRN: [id number]  Monico Blitz, MD  HPI Having abdominal pain: 3 days a week. BMs: MUSHY. NOT LOSING WEIGHT. WEIGHT: 269 LBS SEP 2019 AND NOW 280 LBS. APPETITE: SPOTTY. SNACKS: GRAPES, CARROTS. DRINKS: WATER, OCCASIONAL SODA. EAT OUTS: STARTED. NAUSEA: RARE. DIARRHEA EVERY DAY: A LARGE AMOUNT. ABDOMINAL PAIN(SHAPR-SECS OFF AND ON 2-3 HRS FOR 2-3 DAYS): RIGHT SIDE AND RUNS TOWARD HER NAVEL. WATCHING FAT IN DIET. GB IN.STARTED AND THEN STOPPED AZATHIOPRINE 200 MG DAILY. TRIED FOR 3 MOS AND MADE HER FEEL HIGH. ED DOC PUT ON STEROIDS ~1 MO AGO DUE TO FOOD POSIONING. NO SORES IN MOUTH, RASH ON LEGS, JOINT PAIN, OR BACK PAIN.  PT DENIES FEVER, CHILLS, HEMATOCHEZIA, HEMATEMESIS, vomiting, melena, CHEST PAIN, SHORTNESS OF BREATH,  CHANGE IN BOWEL IN HABITS, constipation,ODYNOPHAGIA, problems swallowing, problems with sedation, OR heartburn or indigestion.   Past Medical History:  Diagnosis Date  . Anemia   . Crohn's disease ([place])   . GERD (gastroesophageal reflux disease)   . Sleep apnea    Past Surgical History:  Procedure Laterality Date  . APPENDECTOMY    . BIOPSY  07/03/2017   Procedure: BIOPSY;  Surgeon: Danie Binder, MD;  [facility];  Service: Endoscopy;;  ileum random colon  . COLONOSCOPY  07/2015   Dr. Britta Mccreedy: evidence of prior surgical anastomosis, multiple biopsies. 2 small ulcers at anastomosis. TI and remaining colon normal, without evidence of IBD. Path with acute colitis with reactive changes, features of Crohn's not specifically identified. non-specific. differentials including infection, drug effects, self-limited colitis  . COLONOSCOPY WITH PROPOFOL N/A 07/03/2017   Procedure: COLONOSCOPY WITH PROPOFOL;  Surgeon: Danie Binder, MD;  [facility]: [facility];  Service: Endoscopy;  Laterality: N/A;  8:30am  . EXPLORATORY LAPAROTOMY  05/2015   Dr. Ladona Horns: large inflammatory mass at  cecum, appendix unrecognizable in phlegmonous mass, right hemicolectomy performed with ileocolic anastamosis. path with crohn's disease involving both ileum and cecum  . TONSILLECTOMY    . tonsils and adenoids     Allergies  Allergen Reactions  . Bactrim [Sulfamethoxazole-Trimethoprim] Hives  . Benadryl [Diphenhydramine] Hives  . Dilaudid [Hydromorphone Hcl] Hives   Current Outpatient Medications  Medication Sig    . Ascorbic Acid (VITA-C PO) Take 1 tablet by mouth daily.    . Cholecalciferol (VITAMIN D3) 125 MCG (5000 UT) CAPS Take 1 capsule (5,000 Units total) by mouth daily.    . clobetasol cream (TEMOVATE) 0.25 % Apply 1 application topically 2 (two) times daily.    . drospirenone-ethinyl estradiol (YASMIN,ZARAH,SYEDA) 3-0.03 MG tablet Take 1 tablet by mouth daily.    Marland Kitchen ELDERBERRY PO Take 1 tablet by mouth daily.    . folic acid (FOLVITE) 852 MCG tablet Take 400 mcg by mouth daily.    Marland Kitchen levocetirizine (XYZAL) 5 MG tablet Take 2.5-5 mg by mouth every evening.     . Multiple Vitamins-Minerals (AIRBORNE PO) Take 1 tablet by mouth daily as needed (immune support).     . Prenatal Vit-Fe Fumarate-FA (PREPLUS) 27-1 MG TABS     .      Marland Kitchen VITAMIN D 1000u QD    . CALCIUM TID    .      Marland Kitchen       Review of Systems PER HPI OTHERWISE ALL SYSTEMS ARE NEGATIVE.    Objective:   Physical Exam Vitals signs reviewed.  Constitutional:  General: She is not in acute distress.    Appearance: She is well-developed.  HENT:     Head: Normocephalic and atraumatic.     Mouth/Throat:     Comments: MASK IN PLACE Eyes:     General: No scleral icterus.    Pupils: Pupils are equal, round, and reactive to light.  Neck:     Musculoskeletal: Normal range of motion and neck supple.  Cardiovascular:     Rate and Rhythm: Normal rate and regular rhythm.     Heart sounds: Normal heart sounds.  Pulmonary:     Effort: Pulmonary effort is normal. No respiratory distress.     Breath sounds: Normal breath  sounds.  Abdominal:     General: Bowel sounds are normal. There is no distension.     Palpations: Abdomen is soft.     Tenderness: There is abdominal tenderness. There is no guarding or rebound.     Comments: MILD RUQ/RLQ TENDERNESS TO PALPATION  Musculoskeletal:     Right lower leg: No edema.     Left lower leg: No edema.  Lymphadenopathy:     Cervical: No cervical adenopathy.  Skin:    Findings: No rash.  Neurological:     Mental Status: She is alert and oriented to person, place, and time.     Comments: NO FOCAL DEFICITS  Psychiatric:        Mood and Affect: Mood normal.     Comments: FLAT AFFECT       Assessment & Plan:

## 2018-08-15 NOTE — Assessment & Plan Note (Signed)
SYMPTOMS NOT IDEALLY CONTROLLED.  DRINK WATER TO KEEP YOUR URINE LIGHT YELLOW. Avoid processed foods and eating out. CONTINUE YOUR WEIGHT LOSS EFFORTS. YOUR BODY MASS INDEX IS OVER 50 WHICH MEANS YOU ARE SUPER MORBIDLY OBESE. OBESITY IS ASSOCIATED WITH AN INCREASED FOR CIRRHOSIS AND ALL CANCERS, INCLUDING ESOPHAGEAL AND COLON CANCER. TRANSITION TO A VEGETARIAN DIET OVER THE NEXT 6-12 MOS. SEE HANDOUT. REFER TO NUTRITION. CONTINUE VITAMIN D/CALCIUM. IF YOU FEEL LIKE YOU CANNOT LOSE THE WEIGHT ON YOUR OWN OVER THE NEXT 6-12 MOS THEN PLEASE CALL FOR A REFERRAL TO THE BARIATRIC WEIGHT LOSS CLINIC.   TO TREAT CROHN'S ILEITIS, ADD PENTASA.   PLEASE CALL IN ONE MONTH IF SYMPTOMS ARE NOT IMPROVED.   FOLLOW UP IN 3 MOS.

## 2018-08-15 NOTE — Progress Notes (Signed)
PATIENT SCHEDULED  °

## 2018-08-19 NOTE — Progress Notes (Signed)
cc'd to pcp 

## 2018-09-04 ENCOUNTER — Ambulatory Visit: Payer: 59 | Admitting: "Endocrinology

## 2018-11-13 ENCOUNTER — Ambulatory Visit: Payer: 59 | Admitting: Gastroenterology

## 2018-11-13 ENCOUNTER — Telehealth: Payer: Self-pay | Admitting: Gastroenterology

## 2018-11-13 ENCOUNTER — Encounter: Payer: Self-pay | Admitting: Gastroenterology

## 2018-11-13 NOTE — Telephone Encounter (Signed)
REVIEWED-NO ADDITIONAL RECOMMENDATIONS. 

## 2018-11-13 NOTE — Telephone Encounter (Signed)
Patient was a no show and letter sent  °

## 2018-11-15 ENCOUNTER — Telehealth: Payer: Self-pay

## 2018-11-15 NOTE — Telephone Encounter (Signed)
I started the PA for [employer] on line. Was unable to get all of the info put in and note said call [employer] @ [phone]. I called and was told the wait time is very long at this time to try again.

## 2018-11-27 NOTE — Telephone Encounter (Signed)
I called [employer] again and spoke to [place]. He said he has a notice that the account terminated in June. He said it was a payer acct and he left a number of [phone] to call.

## 2018-12-05 ENCOUNTER — Other Ambulatory Visit: Payer: Self-pay

## 2018-12-05 ENCOUNTER — Ambulatory Visit (INDEPENDENT_AMBULATORY_CARE_PROVIDER_SITE_OTHER): Payer: No Typology Code available for payment source | Admitting: Gastroenterology

## 2018-12-05 ENCOUNTER — Encounter: Payer: Self-pay | Admitting: Gastroenterology

## 2018-12-05 DIAGNOSIS — K5000 Crohn's disease of small intestine without complications: Secondary | ICD-10-CM

## 2018-12-05 NOTE — Progress Notes (Signed)
Subjective:    Patient ID: [name], female    DOB: Nov 09, 1990, 28 y.o.   MRN: [id number]  Kirstie Peri, MD   HPI BMs: ok. FLARES PERIODICALLY-NO CHANGE IN BOWEL MOVEMENTS. LAST DIARRHEA MON NIGHT AFTER EATING AT A COOK OUT(RED HOT). CUTTING BACK ON CAFFEINE. NEVER HAD SLEEP APNEA MACHINE.  PT DENIES FEVER, CHILLS, HEMATOCHEZIA, HEMATEMESIS, nausea, vomiting, melena, diarrhea, CHEST PAIN, SHORTNESS OF BREATH, CHANGE IN BOWEL IN HABITS, constipation, abdominal pain, problems swallowing, OR heartburn or indigestion.  Past Medical History:  Diagnosis Date  . Anemia   . Crohn's disease ([facility])   . GERD (gastroesophageal reflux disease)   . Sleep apnea    Past Surgical History:  Procedure Laterality Date  . APPENDECTOMY    . BIOPSY  07/03/2017   Procedure: BIOPSY;  Surgeon: West Bali, MD;  [facility]: [facility];  Service: Endoscopy;;  ileum random colon  . COLONOSCOPY  07/2015   Dr. Teena Dunk: evidence of prior surgical anastomosis, multiple biopsies. 2 small ulcers at anastomosis. TI and remaining colon normal, without evidence of IBD. Path with acute colitis with reactive changes, features of Crohn's not specifically identified. non-specific. differentials including infection, drug effects, self-limited colitis  . COLONOSCOPY WITH PROPOFOL N/A 07/03/2017   Procedure: COLONOSCOPY WITH PROPOFOL;  Surgeon: West Bali, MD;  [facility];  Service: Endoscopy;  Laterality: N/A;  8:30am  . EXPLORATORY LAPAROTOMY  05/2015   Dr. 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 path with crohn's disease involving both ileum and cecum  . TONSILLECTOMY    . tonsils and adenoids     Allergies  Allergen Reactions  . Bactrim [Sulfamethoxazole-Trimethoprim] Hives  . Benadryl [Diphenhydramine] Hives  . Dilaudid [Hydromorphone Hcl] Hives   Current Outpatient Medications   Medication Sig    . Ascorbic Acid (VITA-C PO) Take 1 tablet by mouth daily.    . calcium carbonate (TUMS) 500 MG chewable tablet Chew 1 tablet (200 mg of elemental calcium total) by mouth 3 (three) times daily.    . Cholecalciferol (VITAMIN D3) 125 MCG (5000 UT) CAPS Take 1 capsule (5,000 Units total) by mouth daily.    . clobetasol cream (TEMOVATE) 0.05 % Apply 1 application topically 2 (two) times daily.    . drospirenone-ethinyl estradiol (YASMIN,ZARAH,SYEDA) 3-0.03 MG tablet Take 1 tablet by mouth daily.    Marland Kitchen ELDERBERRY PO Take 1 tablet by mouth daily.    . folic acid (FOLVITE) 400 MCG tablet Take 400 mcg by mouth daily.    Marland Kitchen levocetirizine (XYZAL) 5 MG tablet Take 2.5-5 mg by mouth every evening.     Marland Kitchen PREPLUS 27-1 MG TABS     .      .      . Multiple Vitamins-Minerals  Take 1 tablet QD     .      Marland Kitchen      .       Review of Systems PER HPI OTHERWISE ALL SYSTEMS ARE NEGATIVE.    Objective:   Physical Exam Constitutional:      General: She is not in acute distress.    Appearance: Normal appearance.  HENT:     Mouth/Throat:     Comments: MASK IN PLACE Eyes:     General: No scleral icterus.    Pupils: Pupils are equal, round, and reactive to light.  Neck:     Musculoskeletal: Normal range of motion.  Cardiovascular:  Rate and Rhythm: Normal rate and regular rhythm.     Pulses: Normal pulses.     Heart sounds: Normal heart sounds.  Pulmonary:     Effort: Pulmonary effort is normal.     Breath sounds: Normal breath sounds.  Abdominal:     General: Bowel sounds are normal.     Palpations: Abdomen is soft.     Tenderness: There is no abdominal tenderness.  Musculoskeletal:     Right lower leg: No edema.     Left lower leg: No edema.  Lymphadenopathy:     Cervical: No cervical adenopathy.  Skin:    General: Skin is warm and dry.  Neurological:     Mental Status: She is alert and oriented to person, place, and time.     Comments: NO  NEW FOCAL DEFICITS  Psychiatric:         Mood and Affect: Mood normal.     Comments: NORMAL AFFECT       Assessment & Plan:

## 2018-12-05 NOTE — Patient Instructions (Addendum)
EAT TO LIVE AND THINK OF FOOD AS MEDICINE. 75% OF YOUR PLATE SHOULD BE FRUITS/VEGGIES.  To have more energy, and to lose weight:      1. CONTINUE YOUR WEIGHT LOSS EFFORTS. I RECOMMEND YOU READ AND FOLLOW RECOMMENDATIONS BY DR. MARK HYMAN, "10-DAY DETOX DIET".    2. If you must eat bread, EAT EZEKIEL BREAD. IT IS IN THE FROZEN SECTION OF THE GROCERY STORE.    3. DRINK WATER WITH FRUIT OR CUCUMBER ADDED. YOUR URINE SHOULD BE LIGHT YELLOW. AVOID SODA, GATORADE, ENERGY DRINKS, OR DIET SODA.     4. AVOID HIGH FRUCTOSE CORN SYRUP AND CAFFEINE.     5. DO NOT chew SUGAR FREE GUM OR USE ARTIFICIAL SWEETENERS. IF NEEDED USE STEVIA AS A SWEETENER.    6. DO NOT EAT ENRICHED WHEAT FLOUR, PASTA, RICE, OR CEREAL.    7. ONLY EAT WILD CAUGHT SEAFOOD, GRASS FED BEEF OR CHICKEN, PORK FROM PASTURE RAISE PIGS, OR EGGS FROM PASTURE RAISED CHICKENS.    8. CONTINUE EXERCISE REGIMEN.    9. START TAKING VITAMIN B12 DAILY.   ADDITIONAL SUPPLEMENTS TO DECREASE CRAVING AND SUPPRESS YOUR APPETITE:    1. CINNAMON 500 MG EVERY AM PRIOR TO FIRST MEAL.   **STABILIZES BLOOD GLUCOSE**    2. CHROMIUM 400-500 MG WITH MEALS TWICE DAILY.    **FAT BURNER**    3. GREEN TEA EXTRACT ONE DAILY.   **SUPPRESSES YOUR APPETITE**    4. ALPHA LIPOIC ACID 250 MG DAILY.   **NATURAL ANTI-INFLAMMATORY SUPPLEMENT**  FOLLOW UP IN 4 MOS.

## 2018-12-05 NOTE — Telephone Encounter (Signed)
Pt has appointment with Dr. Oneida Alar this afternoon. I called and left vm for her to bring her insurance card when she comes in so I can work on PA for [employer] more.

## 2018-12-05 NOTE — Assessment & Plan Note (Signed)
Surgical remission.  Needs CRP Q YEAR.  EAT TO LIVE AND THINK OF FOOD AS MEDICINE. 75% OF YOUR PLATE SHOULD BE FRUITS/VEGGIES. To have more energy, and to lose weight:     1. CONTINUE YOUR WEIGHT LOSS EFFORTS. I RECOMMEND YOU READ AND FOLLOW RECOMMENDATIONS BY DR. MARK HYMAN, "10-DAY DETOX DIET".   2. If you must eat bread, EAT EZEKIEL BREAD. IT IS IN THE FROZEN SECTION OF THE GROCERY STORE.   3. DRINK WATER WITH FRUIT OR CUCUMBER ADDED. YOUR URINE SHOULD BE LIGHT YELLOW. AVOID SODA, GATORADE, ENERGY DRINKS, OR DIET SODA.    4. AVOID HIGH FRUCTOSE CORN SYRUP AND CAFFEINE.    5. DO NOT chew SUGAR FREE GUM OR USE ARTIFICIAL SWEETENERS. IF NEEDED USE STEVIA AS A SWEETENER.   6. DO NOT EAT ENRICHED WHEAT FLOUR, PASTA, RICE, OR CEREAL.   7. ONLY EAT WILD CAUGHT SEAFOOD, GRASS FED BEEF OR CHICKEN, PORK FROM PASTURE RAISE PIGS, OR EGGS FROM PASTURE RAISED CHICKENS.   8. CONTINUE EXERCISE REGIMEN.   9. START TAKING VITAMIN B12 DAILY.  ADDITIONAL SUPPLEMENTS TO DECREASE CRAVING AND SUPPRESS YOUR APPETITE:    1. CINNAMON 500 MG EVERY AM PRIOR TO FIRST MEAL.   **STABILIZES BLOOD GLUCOSE**   2. CHROMIUM 400-500 MG WITH MEALS TWICE DAILY.    **FAT BURNER**   3. GREEN TEA EXTRACT ONE DAILY.   **SUPPRESSES YOUR APPETITE**   4. ALPHA LIPOIC ACID 250 MG DAILY.   **NATURAL ANTI-INFLAMMATORY SUPPLEMENT**  FOLLOW UP IN 4 MOS.

## 2018-12-06 NOTE — Progress Notes (Signed)
ON RECALL  °

## 2018-12-19 NOTE — Telephone Encounter (Signed)
PT provided new insurance card. I still was unable to do on Cover My Meds. I called her insurance co @ [phone]. I spoke with rep that said the cheapest they could get it for her was $239.00 at any pharmacy other than [facility] and it would be around $1000.00 at [facility]. They recommended the pt call them and they could discuss some international pharmacy prices. I called pt and made her aware and she will call them.

## 2019-01-27 ENCOUNTER — Telehealth: Payer: Self-pay

## 2019-01-27 NOTE — Telephone Encounter (Signed)
I have attempted a PA for [employer] with her newest insurance card and she cannot be located. Left VM for a return all.

## 2019-02-18 NOTE — Telephone Encounter (Signed)
I spoke to Quillian Quince with Cover My Meds.  He checked with pt's pharmacy and she is getting it filled with Good Rx Card.

## 2019-03-26 ENCOUNTER — Encounter: Payer: Self-pay | Admitting: Gastroenterology

## 2020-01-23 ENCOUNTER — Other Ambulatory Visit: Payer: Self-pay

## 2020-01-23 ENCOUNTER — Ambulatory Visit (INDEPENDENT_AMBULATORY_CARE_PROVIDER_SITE_OTHER): Payer: No Typology Code available for payment source | Admitting: Adult Health

## 2020-01-23 ENCOUNTER — Encounter: Payer: Self-pay | Admitting: Adult Health

## 2020-01-23 VITALS — BP 117/77 | HR 95 | Ht 61.0 in | Wt 266.5 lb

## 2020-01-23 DIAGNOSIS — Z3201 Encounter for pregnancy test, result positive: Secondary | ICD-10-CM

## 2020-01-23 DIAGNOSIS — O3680X0 Pregnancy with inconclusive fetal viability, not applicable or unspecified: Secondary | ICD-10-CM | POA: Diagnosis not present

## 2020-01-23 DIAGNOSIS — Z3A01 Less than 8 weeks gestation of pregnancy: Secondary | ICD-10-CM | POA: Diagnosis not present

## 2020-01-23 DIAGNOSIS — K5900 Constipation, unspecified: Secondary | ICD-10-CM

## 2020-01-23 DIAGNOSIS — Z8719 Personal history of other diseases of the digestive system: Secondary | ICD-10-CM | POA: Diagnosis not present

## 2020-01-23 LAB — POCT URINE PREGNANCY: Preg Test, Ur: POSITIVE — AB

## 2020-01-23 NOTE — Progress Notes (Signed)
  Subjective:     Patient ID: Shannon Bentley, female   DOB: 28-Jul-1990, 29 y.o.   MRN: [id number]  HPI Aliah is a 29 year old black female,single, in for UPT has missed period and had 1+HPT. She works at [employer] on [address] in [place]. PCP is Dr Manuella Ghazi.  Review of Systems +missed period with +HPT +constipation +heartburn  Reviewed past medical,surgical, social and family history. Reviewed medications and allergies.     Objective:   Physical Exam BP 117/77 (BP Location: Left Arm, Patient Position: Sitting, Cuff Size: Large)   Pulse 95   Ht 5' 1"  (1.549 m)   Wt 266 lb 8 oz (120.9 kg)   LMP 12/07/2019   BMI 50.35 kg/m UPT is +, about 6+5 weeks by LMP with EDD 09/12/20.Skin warm and dry. Neck: mid line trachea, normal thyroid, good ROM, no lymphadenopathy noted. Lungs: clear to ausculation bilaterally. Cardiovascular: regular rate and rhythm.abdomen is soft, non tender and obese AA is 0 Fall risk is low PHQ 9 score is 0  Upstream - 01/23/20 0903      Pregnancy Intention Screening   Does the patient want to become pregnant in the next year? N/A    Does the patient's partner want to become pregnant in the next year? N/A    Would the patient like to discuss contraceptive options today? N/A      Contraception Wrap Up   Current Method Pregnant/Seeking Pregnancy    End Method Pregnant/Seeking Pregnancy    Contraception Counseling Provided No             Assessment:     1. Pregnancy examination or test, positive result Continue PNV  2. Less than [redacted] weeks gestation of pregnancy  3. Encounter to determine fetal viability of pregnancy, single or unspecified fetus Return in about 3 weeks for dating [place]   4. History of Crohn's disease  5. Constipation, unspecified constipation type Eat more fruit, try colace Can use TUMS or Prilosec for heartburn     Plan:     Review handout by [facility] Note given for no lifting >25 lbs

## 2020-02-16 ENCOUNTER — Encounter: Payer: Self-pay | Admitting: Obstetrics & Gynecology

## 2020-02-16 ENCOUNTER — Ambulatory Visit (INDEPENDENT_AMBULATORY_CARE_PROVIDER_SITE_OTHER): Payer: Medicaid Other

## 2020-02-16 ENCOUNTER — Other Ambulatory Visit: Payer: Self-pay

## 2020-02-16 ENCOUNTER — Other Ambulatory Visit: Payer: No Typology Code available for payment source

## 2020-02-16 DIAGNOSIS — Z3A10 10 weeks gestation of pregnancy: Secondary | ICD-10-CM | POA: Diagnosis not present

## 2020-02-16 DIAGNOSIS — O3680X0 Pregnancy with inconclusive fetal viability, not applicable or unspecified: Secondary | ICD-10-CM

## 2020-02-16 NOTE — Progress Notes (Signed)
[place] 10+1 wks,single IUP with YS,positive FHT 162 BPM,CRL 28.16 mm,posterior placenta,normal right ovary,echogenic left ovarian mass 2.4 x 2.2 x 2.2 cm (? Dermoid) with a complex cyst 2.5 x 2 x 2.2 cm

## 2020-02-21 NOTE — L&D Delivery Note (Signed)
Shannon Bentley is a 30 y.o. female G1P0 with IUP at 35w1dadmitted for IOL for A1GDM .  She progressed with augmentation to complete and pushed 1 hour to deliver.  Cord clamping delayed by several minutes then clamped by CNM and cut by CNM.    Delivery Note At 3:32 AM a viable and healthy female was delivered via Vaginal, Spontaneous (Presentation: Right Occiput Anterior).  APGAR: 6, 8; weight pending.   Placenta status: Spontaneous, Intact.  Cord: 3 vessels with the following complications: nuchal x1, delivered through and easily reduced after  Anesthesia: Epidural Episiotomy:  n/a Lacerations:  bilateral sulcal tear, first degree Suture Repair: 3.0 vicryl Est. Blood Loss (mL):  200  Mom to postpartum.  Baby to Couplet care / Skin to Skin.  CWende MottCNM 09/13/2020, 4:21 AM

## 2020-03-05 ENCOUNTER — Other Ambulatory Visit: Payer: Self-pay

## 2020-03-05 ENCOUNTER — Other Ambulatory Visit: Payer: Self-pay | Admitting: Obstetrics & Gynecology

## 2020-03-05 ENCOUNTER — Other Ambulatory Visit: Payer: Medicaid Other

## 2020-03-05 ENCOUNTER — Encounter: Payer: Self-pay | Admitting: Adult Health

## 2020-03-05 ENCOUNTER — Ambulatory Visit (INDEPENDENT_AMBULATORY_CARE_PROVIDER_SITE_OTHER): Payer: Medicaid Other

## 2020-03-05 DIAGNOSIS — Z3A12 12 weeks gestation of pregnancy: Secondary | ICD-10-CM

## 2020-03-05 DIAGNOSIS — Z3682 Encounter for antenatal screening for nuchal translucency: Secondary | ICD-10-CM

## 2020-03-05 DIAGNOSIS — Z3400 Encounter for supervision of normal first pregnancy, unspecified trimester: Secondary | ICD-10-CM | POA: Insufficient documentation

## 2020-03-05 DIAGNOSIS — O0990 Supervision of high risk pregnancy, unspecified, unspecified trimester: Secondary | ICD-10-CM | POA: Insufficient documentation

## 2020-03-05 DIAGNOSIS — Z3401 Encounter for supervision of normal first pregnancy, first trimester: Secondary | ICD-10-CM

## 2020-03-05 NOTE — Progress Notes (Signed)
[place] 12+5 wks,measurements c/w dates,crl 54.75 mm,fhr 152 bpm,posterior placenta,NB present,NT 1.1 mm,left ovarian echogenic mass (dermoid) 2.3 x 2.3 x 2.3 cm with a complex cyst 2.4 x 1.8 x 2 cm,normal right ovary

## 2020-03-08 ENCOUNTER — Ambulatory Visit: Payer: Medicaid Other | Admitting: *Deleted

## 2020-03-08 ENCOUNTER — Other Ambulatory Visit: Payer: Medicaid Other

## 2020-03-08 ENCOUNTER — Encounter: Payer: Medicaid Other | Admitting: Family Medicine

## 2020-03-08 LAB — CBC/D/PLT+RPR+RH+ABO+RUB AB...
Antibody Screen: NEGATIVE
Basophils Absolute: 0 10*3/uL (ref 0.0–0.2)
Basos: 0 %
EOS (ABSOLUTE): 0 10*3/uL (ref 0.0–0.4)
Eos: 1 %
HCV Ab: <0.1 s/co ratio (ref 0.0–0.9)
HIV Screen 4th Generation wRfx: NONREACTIVE
Hematocrit: 38.4 % (ref 34.0–46.6)
Hemoglobin: 12.6 g/dL (ref 11.1–15.9)
Hepatitis B Surface Ag: NEGATIVE
Immature Grans (Abs): 0 10*3/uL (ref 0.0–0.1)
Immature Granulocytes: 0 %
Lymphocytes Absolute: 1.7 10*3/uL (ref 0.7–3.1)
Lymphs: 25 %
MCH: 26.6 pg (ref 26.6–33.0)
MCHC: 32.8 g/dL (ref 31.5–35.7)
MCV: 81 fL (ref 79–97)
Monocytes Absolute: 0.7 10*3/uL (ref 0.1–0.9)
Monocytes: 10 %
Neutrophils Absolute: 4.3 10*3/uL (ref 1.4–7.0)
Neutrophils: 64 %
Platelets: 329 10*3/uL (ref 150–450)
RBC: 4.73 x10E6/uL (ref 3.77–5.28)
RDW: 13.1 % (ref 11.7–15.4)
RPR Ser Ql: NONREACTIVE
Rh Factor: POSITIVE
Rubella Antibodies, IGG: 1.65 index (ref >0.99)
WBC: 6.7 10*3/uL (ref 3.4–10.8)

## 2020-03-08 LAB — INTEGRATED 1
Crown Rump Length: 54.8 mm
Gest. Age on Collection Date: 11.9 weeks
Maternal Age at EDD: 30 yr
Nuchal Translucency (NT): 1.1 mm
Number of Fetuses: 1
PAPP-A Value: 329.5 ng/mL
Weight: 260 [lb_av]

## 2020-03-08 LAB — HCV INTERPRETATION

## 2020-03-16 ENCOUNTER — Ambulatory Visit: Payer: Medicaid Other | Admitting: *Deleted

## 2020-03-16 ENCOUNTER — Ambulatory Visit (INDEPENDENT_AMBULATORY_CARE_PROVIDER_SITE_OTHER): Payer: 59 | Admitting: Women's Health

## 2020-03-16 ENCOUNTER — Encounter: Payer: Self-pay | Admitting: Women's Health

## 2020-03-16 ENCOUNTER — Other Ambulatory Visit: Payer: Self-pay

## 2020-03-16 VITALS — BP 114/77 | HR 96 | Wt 263.5 lb

## 2020-03-16 DIAGNOSIS — Z3402 Encounter for supervision of normal first pregnancy, second trimester: Secondary | ICD-10-CM | POA: Diagnosis not present

## 2020-03-16 DIAGNOSIS — Z131 Encounter for screening for diabetes mellitus: Secondary | ICD-10-CM | POA: Diagnosis not present

## 2020-03-16 DIAGNOSIS — Z3A14 14 weeks gestation of pregnancy: Secondary | ICD-10-CM

## 2020-03-16 DIAGNOSIS — Z363 Encounter for antenatal screening for malformations: Secondary | ICD-10-CM | POA: Diagnosis not present

## 2020-03-16 MED ORDER — ASPIRIN 81 MG PO TBEC: 81.0000 mg | DELAYED_RELEASE_TABLET | Freq: Every day | ORAL | 6 refills | Status: DC

## 2020-03-16 NOTE — Patient Instructions (Signed)
Shannon Bentley, I greatly value your feedback.  If you receive a survey following your visit with [place] today, we appreciate you taking the time to fill it out.  Thanks, Knute Neu, CNM, WHNP-BC  [facility] [place] at [facility] ([place], [place] [address]) Entrance C, located off of [place] parking  Go to [email or web address] to register for FREE online childbirth classes  [place] Pediatricians/Family Doctors:  [facility] [place] [phone]                 [place] [phone] (usually not accepting new patients unless you have family there already, you are always welcome to call and ask)       [facility] Department [phone]       [place] Pediatricians/Family Doctors:   [facility]: [phone]  [facility]: [phone]  [facility]: [place] Doctors:   [facility]: [place] [facility]: [place]:  [place]: [phone]    Home Blood Pressure Monitoring for Patients   Your provider has recommended that you check your blood pressure (BP) at least once a week at home. If you do not have a blood pressure cuff at home, one will be provided for you. Contact your provider if you have not received your monitor within 1 week.   Helpful Tips for Accurate Home Blood Pressure Checks  . Don't smoke, exercise, or drink caffeine 30 minutes before checking your BP . Use the restroom before checking your BP (a full bladder can raise your pressure) . Relax in a comfortable upright chair . Feet on the ground . Left arm resting comfortably on a flat surface at the level of your heart . Legs uncrossed . Back supported . Sit quietly and don't talk . Place the cuff on your bare arm . Adjust snuggly, so  that only two fingertips can fit between your skin and the top of the cuff . Check 2 readings separated by at least one minute . Keep a log of your BP readings . For a visual, please reference this diagram: http://ccnc.care/bpdiagram  Provider Name: [facility]     Phone: [phone]  Zone 1: ALL CLEAR  Continue to monitor your symptoms:  . BP reading is less than 140 (top number) or less than 90 (bottom number)  . No right upper stomach pain . No headaches or seeing spots . No feeling nauseated or throwing up . No swelling in face and hands  Zone 2: CAUTION Call your doctor's office for any of the following:  . BP reading is greater than 140 (top number) or greater than 90 (bottom number)  . Stomach pain under your ribs in the middle or right side . Headaches or seeing spots . Feeling nauseated or throwing up . Swelling in face and hands  Zone 3: EMERGENCY  Seek immediate medical care if you have any of the following:  . BP reading is greater than160 (top number) or greater than 110 (bottom number) . Severe headaches not improving with Tylenol . Serious difficulty catching your breath . Any worsening symptoms from Zone 2     Second Trimester of Pregnancy The second trimester is from week 14 through week 27 (months 4 through 6). The second trimester is often a time when you feel your best.  Your body has adjusted to being pregnant, and you begin to feel better physically. Usually, morning sickness has lessened or quit completely, you may have more energy, and you may have an increase in appetite. The second trimester is also a time when the fetus is growing rapidly. At the end of the sixth month, the fetus is about 9 inches long and weighs about 1 pounds. You will likely begin to feel the baby move (quickening) between 16 and 20 weeks of pregnancy. Body changes during your second trimester Your body continues to go through many changes during your second trimester. The  changes vary from woman to woman.  Your weight will continue to increase. You will notice your lower abdomen bulging out.  You may begin to get stretch marks on your hips, abdomen, and breasts.  You may develop headaches that can be relieved by medicines. The medicines should be approved by your health care provider.  You may urinate more often because the fetus is pressing on your bladder.  You may develop or continue to have heartburn as a result of your pregnancy.  You may develop constipation because certain hormones are causing the muscles that push waste through your intestines to slow down.  You may develop hemorrhoids or swollen, bulging veins (varicose veins).  You may have back pain. This is caused by: ? Weight gain. ? Pregnancy hormones that are relaxing the joints in your pelvis. ? A shift in weight and the muscles that support your balance.  Your breasts will continue to grow and they will continue to become tender.  Your gums may bleed and may be sensitive to brushing and flossing.  Dark spots or blotches (chloasma, mask of pregnancy) may develop on your face. This will likely fade after the baby is born.  A dark line from your belly button to the pubic area (linea nigra) may appear. This will likely fade after the baby is born.  You may have changes in your hair. These can include thickening of your hair, rapid growth, and changes in texture. Some women also have hair loss during or after pregnancy, or hair that feels dry or thin. Your hair will most likely return to normal after your baby is born.  What to expect at prenatal visits During a routine prenatal visit:  You will be weighed to make sure you and the fetus are growing normally.  Your blood pressure will be taken.  Your abdomen will be measured to track your baby's growth.  The fetal heartbeat will be listened to.  Any test results from the previous visit will be discussed.  Your health care  provider may ask you:  How you are feeling.  If you are feeling the baby move.  If you have had any abnormal symptoms, such as leaking fluid, bleeding, severe headaches, or abdominal cramping.  If you are using any tobacco products, including cigarettes, chewing tobacco, and electronic cigarettes.  If you have any questions.  Other tests that may be performed during your second trimester include:  Blood tests that check for: ? Low iron levels (anemia). ? High blood sugar that affects pregnant women (gestational diabetes) between 64 and 28 weeks. ? Rh antibodies. This is to check for a protein on red blood cells (Rh factor).  Urine tests to check for infections, diabetes, or protein in the urine.  An ultrasound to confirm the proper growth and development of the baby.  An amniocentesis to check for possible genetic problems.  Fetal screens  for spina bifida and Down syndrome.  HIV (human immunodeficiency virus) testing. Routine prenatal testing includes screening for HIV, unless you choose not to have this test.  Follow these instructions at home: Medicines  Follow your health care provider's instructions regarding medicine use. Specific medicines may be either safe or unsafe to take during pregnancy.  Take a prenatal vitamin that contains at least 600 micrograms (mcg) of folic acid.  If you develop constipation, try taking a stool softener if your health care provider approves. Eating and drinking  Eat a balanced diet that includes fresh fruits and vegetables, whole grains, good sources of protein such as meat, eggs, or tofu, and low-fat dairy. Your health care provider will help you determine the amount of weight gain that is right for you.  Avoid raw meat and uncooked cheese. These carry germs that can cause birth defects in the baby.  If you have low calcium intake from food, talk to your health care provider about whether you should take a daily calcium  supplement.  Limit foods that are high in fat and processed sugars, such as fried and sweet foods.  To prevent constipation: ? Drink enough fluid to keep your urine clear or pale yellow. ? Eat foods that are high in fiber, such as fresh fruits and vegetables, whole grains, and beans. Activity  Exercise only as directed by your health care provider. Most women can continue their usual exercise routine during pregnancy. Try to exercise for 30 minutes at least 5 days a week. Stop exercising if you experience uterine contractions.  Avoid heavy lifting, wear low heel shoes, and practice good posture.  A sexual relationship may be continued unless your health care provider directs you otherwise. Relieving pain and discomfort  Wear a good support bra to prevent discomfort from breast tenderness.  Take warm sitz baths to soothe any pain or discomfort caused by hemorrhoids. Use hemorrhoid cream if your health care provider approves.  Rest with your legs elevated if you have leg cramps or low back pain.  If you develop varicose veins, wear support hose. Elevate your feet for 15 minutes, 3-4 times a day. Limit salt in your diet. Prenatal Care  Write down your questions. Take them to your prenatal visits.  Keep all your prenatal visits as told by your health care provider. This is important. Safety  Wear your seat belt at all times when driving.  Make a list of emergency phone numbers, including numbers for family, friends, the hospital, and police and fire departments. General instructions  Ask your health care provider for a referral to a local prenatal education class. Begin classes no later than the beginning of month 6 of your pregnancy.  Ask for help if you have counseling or nutritional needs during pregnancy. Your health care provider can offer advice or refer you to specialists for help with various needs.  Do not use hot tubs, steam rooms, or saunas.  Do not douche or use  tampons or scented sanitary pads.  Do not cross your legs for long periods of time.  Avoid cat litter boxes and soil used by cats. These carry germs that can cause birth defects in the baby and possibly loss of the fetus by miscarriage or stillbirth.  Avoid all smoking, herbs, alcohol, and unprescribed drugs. Chemicals in these products can affect the formation and growth of the baby.  Do not use any products that contain nicotine or tobacco, such as cigarettes and e-cigarettes. If you need help quitting,  ask your health care provider.  Visit your dentist if you have not gone yet during your pregnancy. Use a soft toothbrush to brush your teeth and be gentle when you floss. Contact a health care provider if:  You have dizziness.  You have mild pelvic cramps, pelvic pressure, or nagging pain in the abdominal area.  You have persistent nausea, vomiting, or diarrhea.  You have a bad smelling vaginal discharge.  You have pain when you urinate. Get help right away if:  You have a fever.  You are leaking fluid from your vagina.  You have spotting or bleeding from your vagina.  You have severe abdominal cramping or pain.  You have rapid weight gain or weight loss.  You have shortness of breath with chest pain.  You notice sudden or extreme swelling of your face, hands, ankles, feet, or legs.  You have not felt your baby move in over an hour.  You have severe headaches that do not go away when you take medicine.  You have vision changes. Summary  The second trimester is from week 14 through week 27 (months 4 through 6). It is also a time when the fetus is growing rapidly.  Your body goes through many changes during pregnancy. The changes vary from woman to woman.  Avoid all smoking, herbs, alcohol, and unprescribed drugs. These chemicals affect the formation and growth your baby.  Do not use any tobacco products, such as cigarettes, chewing tobacco, and e-cigarettes. If you  need help quitting, ask your health care provider.  Contact your health care provider if you have any questions. Keep all prenatal visits as told by your health care provider. This is important. This information is not intended to replace advice given to you by your health care provider. Make sure you discuss any questions you have with your health care provider. Document Released: 01/31/2001 Document Revised: 07/15/2015 Document Reviewed: 04/09/2012 Elsevier Interactive Patient Education  2017 [employer].

## 2020-03-16 NOTE — Progress Notes (Signed)
INITIAL OBSTETRICAL VISIT Patient name: Shannon Bentley MRN [id number]  Date of birth: [birth year] Chief Complaint:   Initial Prenatal Visit (Right lower back pain)  History of Present Illness:   Shannon Bentley is a 30 y.o. G58P0 African American female at 62w2dby LMP c/w u/s at 9 weeks with an Estimated Date of Delivery: 09/12/20 being seen today for her initial obstetrical visit.   Her obstetrical history is significant for primigravida.   Today she reports no complaints.  [facility]  Decreased Interest 0  Down, Depressed, Hopeless 0  PHQ - 2 Score 0  Altered sleeping 0  Tired, decreased energy 0  Change in appetite 0  Feeling bad or failure about yourself  0  Trouble concentrating 0  Moving slowly or fidgety/restless 0  Suicidal thoughts 0  PHQ-9 Score 0    Patient's last menstrual period was 12/07/2019. Wants pap next visit Review of Systems:   Pertinent items are noted in HPI Denies cramping/contractions, leakage of fluid, vaginal bleeding, abnormal vaginal discharge w/ itching/odor/irritation, headaches, visual changes, shortness of breath, chest pain, abdominal pain, severe nausea/vomiting, or problems with urination or bowel movements unless otherwise stated above.  Pertinent History Reviewed:  Reviewed past medical,surgical, social, obstetrical and family history.  Reviewed problem list, medications and allergies. OB History  Gravida Para Term Preterm AB Living  1            SAB IAB Ectopic Multiple Live Births               # Outcome Date GA Lbr Len/2nd Weight Sex Delivery Anes PTL Lv  1 Current            Physical Assessment:   Vitals:   03/16/20 0931  BP: 114/77  Pulse: 96  Weight: 263 lb 8 oz (119.5 kg)  Body mass index is 49.79 kg/m.       Physical Examination:  General appearance - well appearing, and in no distress  Mental status - alert, oriented to person, place, and time  Psych:  She has a normal mood and affect  Skin -  warm and dry, normal color, no suspicious lesions noted  Chest - effort normal, all lung fields clear to auscultation bilaterally  Heart - normal rate and regular rhythm  Abdomen - soft, nontender  Extremities:  No swelling or varicosities noted  Thin prep pap is not done  Chaperone: N/A    FHR via doppler: 138  No results found for this or any previous visit (from the past 24 hour(s)).  Assessment & Plan:  1) Low-Risk Pregnancy G1P0 at 115w2dith an Estimated Date of Delivery: 09/12/20   2) Initial OB visit  Meds:  Meds ordered this encounter  Medications  . aspirin 81 MG EC tablet    Sig: Take 1 tablet (81 mg total) by mouth daily. Swallow whole.    Dispense:  90 tablet    Refill:  6    Order Specific Question:   Supervising Provider    Answer:   EUTania Ade [[id number]]    Initial labs obtained last week w/ 1st IT/NT, add A1C today Continue prenatal vitamins Reviewed n/v relief measures and warning s/s to report Reviewed recommended weight gain based on pre-gravid BMI Encouraged well-balanced diet Genetic & carrier screening discussed: requests Panorama, NT/IT and Horizon 14  Ultrasound discussed; fetal survey: requested CCNC completed> form faxed if has or is planning to apply for medicaid The nature of [place]  [facility] [employer] with multiple MDs and other Advanced Practice Providers was explained to patient; also emphasized that fellows, residents, and students are part of our team. Has home bp cuff. Check bp weekly, let us know if >140/90.  NFPartnership offered, accepted, referral faxed   Indications for ASA therapy (per uptodate) OR Two or more of the following: Nulliparity Yes Obesity (BMI>30 kg/m2) Yes  Sociodemographic characteristics (African American race, low socioeconomic level) Yes   Indications for early A1C (per uptodate) BMI >=25 (>=23 in Asian women) AND one of the following High-risk race/ethnicity (eg, African American, Latino, Native  American, [place] American, [place]) Yes  Follow-up: Return in about 4 weeks (around 04/13/2020) for Hinds w/ pap, 2nd IT, [id number], in person, CNM.   Orders Placed This Encounter  Procedures  . Urine Culture  . GC/Chlamydia Probe Amp  . US OB Comp + 14 Wk  . Genetic Screening  . Pain Management Screening Profile (10S)  . Hemoglobin A1c  . POC Urinalysis Dipstick OB    Roma Schanz CNM, [facility] 03/16/2020 10:32 AM

## 2020-03-17 ENCOUNTER — Encounter: Payer: Self-pay | Admitting: Women's Health

## 2020-03-17 ENCOUNTER — Ambulatory Visit: Payer: Medicaid Other | Admitting: *Deleted

## 2020-03-17 ENCOUNTER — Encounter: Payer: Medicaid Other | Admitting: Advanced Practice Midwife

## 2020-03-17 DIAGNOSIS — R7303 Prediabetes: Secondary | ICD-10-CM | POA: Insufficient documentation

## 2020-03-17 LAB — HEMOGLOBIN A1C
Est. average glucose Bld gHb Est-mCnc: 131 mg/dL
Hgb A1c MFr Bld: 6.2 % — ABNORMAL HIGH (ref 4.8–5.6)

## 2020-03-18 LAB — GC/CHLAMYDIA PROBE AMP
Chlamydia trachomatis, NAA: NEGATIVE
Neisseria Gonorrhoeae by PCR: NEGATIVE

## 2020-03-18 LAB — PMP SCREEN PROFILE (10S), URINE
Amphetamine Scrn, Ur: NEGATIVE ng/mL
BARBITURATE SCREEN URINE: NEGATIVE ng/mL
BENZODIAZEPINE SCREEN, URINE: NEGATIVE ng/mL
CANNABINOIDS UR QL SCN: NEGATIVE ng/mL
Cocaine (Metab) Scrn, Ur: NEGATIVE ng/mL
Creatinine(Crt), U: 232.2 mg/dL (ref 20.0–300.0)
Methadone Screen, Urine: NEGATIVE ng/mL
OXYCODONE+OXYMORPHONE UR QL SCN: NEGATIVE ng/mL
Opiate Scrn, Ur: NEGATIVE ng/mL
Ph of Urine: 6.5 (ref 4.5–8.9)
Phencyclidine Qn, Ur: NEGATIVE ng/mL
Propoxyphene Scrn, Ur: NEGATIVE ng/mL

## 2020-03-18 LAB — URINE CULTURE: Organism ID, Bacteria: NO GROWTH

## 2020-03-22 ENCOUNTER — Other Ambulatory Visit: Payer: Medicaid Other

## 2020-03-23 ENCOUNTER — Encounter: Payer: Self-pay | Admitting: Women's Health

## 2020-03-23 DIAGNOSIS — Z8632 Personal history of gestational diabetes: Secondary | ICD-10-CM | POA: Insufficient documentation

## 2020-03-23 DIAGNOSIS — O24410 Gestational diabetes mellitus in pregnancy, diet controlled: Secondary | ICD-10-CM | POA: Insufficient documentation

## 2020-03-23 LAB — GLUCOSE TOLERANCE, 2 HOURS W/ 1HR
Glucose, 1 hour: 191 mg/dL — ABNORMAL HIGH (ref 65–179)
Glucose, 2 hour: 155 mg/dL — ABNORMAL HIGH (ref 65–152)
Glucose, Fasting: 110 mg/dL — ABNORMAL HIGH (ref 65–91)

## 2020-03-24 ENCOUNTER — Other Ambulatory Visit: Payer: Self-pay | Admitting: *Deleted

## 2020-03-24 DIAGNOSIS — O24410 Gestational diabetes mellitus in pregnancy, diet controlled: Secondary | ICD-10-CM

## 2020-03-24 DIAGNOSIS — Z3A15 15 weeks gestation of pregnancy: Secondary | ICD-10-CM

## 2020-03-24 DIAGNOSIS — O0990 Supervision of high risk pregnancy, unspecified, unspecified trimester: Secondary | ICD-10-CM

## 2020-03-24 MED ORDER — ACCU-CHEK SOFTCLIX LANCETS MISC: 12 refills | Status: DC

## 2020-03-24 MED ORDER — ACCU-CHEK GUIDE VI STRP
ORAL_STRIP | 12 refills | Status: DC
Start: 2020-03-24 — End: 2020-05-11

## 2020-03-24 MED ORDER — ACCU-CHEK GUIDE ME W/DEVICE KIT: 1.0000 | PACK | Freq: Four times a day (QID) | 0 refills | Status: DC

## 2020-03-25 ENCOUNTER — Telehealth: Payer: Self-pay | Admitting: Family Medicine

## 2020-03-25 NOTE — Telephone Encounter (Signed)
LVM for pt to call back for EDU appt,km

## 2020-03-26 ENCOUNTER — Other Ambulatory Visit: Payer: Self-pay | Admitting: Women's Health

## 2020-03-29 ENCOUNTER — Other Ambulatory Visit: Payer: Self-pay | Admitting: Women's Health

## 2020-03-29 ENCOUNTER — Encounter: Payer: Self-pay | Admitting: Women's Health

## 2020-03-29 ENCOUNTER — Telehealth: Payer: Self-pay | Admitting: Women's Health

## 2020-03-29 DIAGNOSIS — O285 Abnormal chromosomal and genetic finding on antenatal screening of mother: Secondary | ICD-10-CM | POA: Insufficient documentation

## 2020-03-29 DIAGNOSIS — O0990 Supervision of high risk pregnancy, unspecified, unspecified trimester: Secondary | ICD-10-CM

## 2020-03-29 NOTE — Telephone Encounter (Signed)
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 Order placed and note routed to [facility] to schedule. Pt states she has not scheduled dietician appt as her mom is diabetic and has many nurse friends who can help her. Strongly advised she call and make dietician appt as diabetes during pregnancy is completely different. Pt states she will.  Roma Schanz, CNM, [facility] 03/29/2020 4:32 PM

## 2020-04-07 ENCOUNTER — Other Ambulatory Visit: Payer: Self-pay | Admitting: Advanced Practice Midwife

## 2020-04-07 MED ORDER — COMFORT FIT MATERNITY SUPP LG MISC: 1.0000 | 0 refills | Status: DC | PRN

## 2020-04-13 ENCOUNTER — Encounter: Payer: Self-pay | Admitting: Women's Health

## 2020-04-13 ENCOUNTER — Other Ambulatory Visit: Payer: Self-pay | Admitting: Women's Health

## 2020-04-13 ENCOUNTER — Ambulatory Visit (INDEPENDENT_AMBULATORY_CARE_PROVIDER_SITE_OTHER): Payer: 59

## 2020-04-13 ENCOUNTER — Ambulatory Visit (INDEPENDENT_AMBULATORY_CARE_PROVIDER_SITE_OTHER): Payer: Medicaid Other | Admitting: Family Medicine

## 2020-04-13 ENCOUNTER — Other Ambulatory Visit: Payer: Self-pay

## 2020-04-13 ENCOUNTER — Encounter: Payer: Self-pay | Admitting: Family Medicine

## 2020-04-13 VITALS — BP 116/75 | HR 108 | Wt 260.0 lb

## 2020-04-13 DIAGNOSIS — Z3A18 18 weeks gestation of pregnancy: Secondary | ICD-10-CM

## 2020-04-13 DIAGNOSIS — O24410 Gestational diabetes mellitus in pregnancy, diet controlled: Secondary | ICD-10-CM

## 2020-04-13 DIAGNOSIS — O99213 Obesity complicating pregnancy, third trimester: Secondary | ICD-10-CM

## 2020-04-13 DIAGNOSIS — N83202 Unspecified ovarian cyst, left side: Secondary | ICD-10-CM | POA: Insufficient documentation

## 2020-04-13 DIAGNOSIS — Z363 Encounter for antenatal screening for malformations: Secondary | ICD-10-CM | POA: Diagnosis not present

## 2020-04-13 DIAGNOSIS — O0992 Supervision of high risk pregnancy, unspecified, second trimester: Secondary | ICD-10-CM

## 2020-04-13 DIAGNOSIS — Z3482 Encounter for supervision of other normal pregnancy, second trimester: Secondary | ICD-10-CM

## 2020-04-13 DIAGNOSIS — Z1379 Encounter for other screening for genetic and chromosomal anomalies: Secondary | ICD-10-CM

## 2020-04-13 DIAGNOSIS — O285 Abnormal chromosomal and genetic finding on antenatal screening of mother: Secondary | ICD-10-CM

## 2020-04-13 DIAGNOSIS — Z124 Encounter for screening for malignant neoplasm of cervix: Secondary | ICD-10-CM

## 2020-04-13 DIAGNOSIS — Z3402 Encounter for supervision of normal first pregnancy, second trimester: Secondary | ICD-10-CM

## 2020-04-13 DIAGNOSIS — O0990 Supervision of high risk pregnancy, unspecified, unspecified trimester: Secondary | ICD-10-CM

## 2020-04-13 LAB — POCT URINALYSIS DIPSTICK OB
Blood, UA: NEGATIVE
Glucose, UA: NEGATIVE
Ketones, UA: NEGATIVE
Leukocytes, UA: NEGATIVE
Nitrite, UA: NEGATIVE
POC,PROTEIN,UA: NEGATIVE

## 2020-04-13 NOTE — Progress Notes (Signed)
[place] 43+0 wks,cephalic,posterior placenta gr 0,cx 3.8 cm,svp of fluid 4 cm,normal right ovary,complex cyst with echogenic left ovarian mass 3.8 x 2.9 x 3.5 cm  (?dermoid),fhr 144 bpm,EFW 209 g 18%,anatomy complete,no obvious abnormalities,limited view because of pt body habitus

## 2020-04-13 NOTE — Progress Notes (Signed)
PRENATAL VISIT NOTE  Subjective:  Shannon Bentley is a 30 y.o. G1P0 at 70w2dbeing seen today for ongoing prenatal care.  She is currently monitored for the following issues for this high-risk pregnancy and has Crohn's ileitis, without complications ([place]; Diarrhea; Vitamin D deficiency; Morbid obesity ([place]; History of Crohn's disease; Constipation; Supervision of high risk pregnancy, antepartum; Pre-diabetes; Gestational diabetes mellitus, class A1; Abnormal chromosomal and genetic finding on antenatal screening mother; Obesity affecting pregnancy in third trimester, antepartum; and Left ovarian cyst on their problem list.  Patient reports no complaints.   . Vag. Bleeding: None.  Movement: Present. Denies leaking of fluid.   GDM: Fasting 76-90s 2hr pp 109-120, with pancakes was >150 Has not met with nutrition  Leg pain: shooting down left leg.   The following portions of the patient's history were reviewed and updated as appropriate: allergies, current medications, past family history, past medical history, past social history, past surgical history and problem list.   Objective:   Vitals:   04/13/20 1123  BP: 116/75  Pulse: (!) 108  Weight: 260 lb (117.9 kg)    Fetal Status:     Movement: Present     General:  Alert, oriented and cooperative. Patient is in no acute distress.  Skin: Skin is warm and dry. No rash noted.   Cardiovascular: Normal heart rate noted  Respiratory: Normal respiratory effort, no problems with respiration noted  Abdomen: Soft, gravid, appropriate for gestational age.  Pain/Pressure: Absent     Pelvic: Cervical exam deferred        Extremities: Normal range of motion.  Edema: None  Mental Status: Normal mood and affect. Normal behavior. Normal judgment and thought content.   Assessment and Plan:  Pregnancy: G1P0 at 173w2d. Genetic screening - INTEGRATED 2  2. Routine cervical smear Declined today  3. [redacted] weeks gestation of pregnancy - POC  Urinalysis Dipstick OB  4. Supervision of high risk pregnancy in second trimester Up to date TWG=0 lb (0 kg)  - POC Urinalysis Dipstick OB  5. Gestational diabetes mellitus, class A1 Controlled currently  Continue to bring in log If starts medication , needs antenatal testing Counseled on dietary compliance and exerise Plan for EFW at 36 wwks  6. Abnormal chromosomal and genetic finding on antenatal screening mother Typical sex chromosones Has GC visit upcoming  Preterm labor symptoms and general obstetric precautions including but not limited to vaginal bleeding, contractions, leaking of fluid and fetal movement were reviewed in detail with the patient. Please refer to After Visit Summary for other counseling recommendations.   Return in about 4 weeks (around 05/11/2020) for Routine prenatal care, MD or APP.  Future Appointments  Date Time Provider [place]3/02/2020 10:30 AM [facility] GENETIC COUNSELING RM [facility]/08/2020  9:30 AM [facility]URSE [facility]/08/2020  9:45 AM [facility] US4 [facility]3/09/2020  8:15 AM [facility]-EDUCATION [facility]WH [facility]3/22/2022  1:50 PM BoRoma SchanzCNM CWH-FT FTOBGYN    KiCaren MacadamMD

## 2020-04-16 LAB — INTEGRATED 2
AFP MoM: 1.18
Alpha-Fetoprotein: 31.7 ng/mL
Crown Rump Length: 54.8 mm
DIA MoM: 1.52
DIA Value: 174.9 pg/mL
Estriol, Unconjugated: 1.18 ng/mL
Gest. Age on Collection Date: 11.9 weeks
Gestational Age: 17.6 weeks
Maternal Age at EDD: 30 yr
Nuchal Translucency (NT): 1.1 mm
Nuchal Translucency MoM: 0.9
Number of Fetuses: 1
PAPP-A MoM: 0.84
PAPP-A Value: 329.5 ng/mL
Test Results:: NEGATIVE
Weight: 260 [lb_av]
Weight: 260 [lb_av]
hCG MoM: 3.13
hCG Value: 55.8 IU/mL
uE3 MoM: 1.07

## 2020-04-20 ENCOUNTER — Telehealth: Payer: Self-pay | Admitting: Obstetrics and Gynecology

## 2020-04-20 NOTE — Telephone Encounter (Signed)
Shannon Bentley did not show for genetic counseling appointment on 04/20/20 to review Panorama results and Horizon screening results.  Left message to call back to reschedule.  She is scheduled for an anatomy ultrasound on 04/26/20 and can be offered genetic counseling at that time if desired.  We may be reached at [phone].  Wilburt Finlay, MS, CGC

## 2020-04-26 ENCOUNTER — Ambulatory Visit: Payer: Medicaid Other

## 2020-04-26 ENCOUNTER — Other Ambulatory Visit: Payer: Medicaid Other

## 2020-04-27 ENCOUNTER — Other Ambulatory Visit: Payer: Medicaid Other

## 2020-05-11 ENCOUNTER — Ambulatory Visit (INDEPENDENT_AMBULATORY_CARE_PROVIDER_SITE_OTHER): Payer: Medicaid Other | Admitting: Women's Health

## 2020-05-11 ENCOUNTER — Other Ambulatory Visit (HOSPITAL_COMMUNITY)
Admission: RE | Admit: 2020-05-11 | Discharge: 2020-05-11 | Disposition: A | Discharge status: Home / Self Care | Payer: 59 | Source: Ambulatory Visit | Attending: Obstetrics & Gynecology | Admitting: Obstetrics & Gynecology

## 2020-05-11 ENCOUNTER — Encounter: Payer: Medicaid Other | Admitting: Women's Health

## 2020-05-11 ENCOUNTER — Encounter: Payer: Self-pay | Admitting: Women's Health

## 2020-05-11 ENCOUNTER — Other Ambulatory Visit: Payer: Self-pay

## 2020-05-11 VITALS — BP 116/75 | HR 104 | Wt 262.0 lb

## 2020-05-11 DIAGNOSIS — Z3A15 15 weeks gestation of pregnancy: Secondary | ICD-10-CM

## 2020-05-11 DIAGNOSIS — O0990 Supervision of high risk pregnancy, unspecified, unspecified trimester: Secondary | ICD-10-CM

## 2020-05-11 DIAGNOSIS — Z124 Encounter for screening for malignant neoplasm of cervix: Secondary | ICD-10-CM

## 2020-05-11 DIAGNOSIS — O0992 Supervision of high risk pregnancy, unspecified, second trimester: Secondary | ICD-10-CM

## 2020-05-11 DIAGNOSIS — Z3A22 22 weeks gestation of pregnancy: Secondary | ICD-10-CM

## 2020-05-11 DIAGNOSIS — O24410 Gestational diabetes mellitus in pregnancy, diet controlled: Secondary | ICD-10-CM

## 2020-05-11 LAB — POCT URINALYSIS DIPSTICK OB
Blood, UA: NEGATIVE
Glucose, UA: NEGATIVE
Ketones, UA: NEGATIVE
Leukocytes, UA: NEGATIVE
Nitrite, UA: NEGATIVE
POC,PROTEIN,UA: NEGATIVE

## 2020-05-11 MED ORDER — ACCU-CHEK GUIDE VI STRP: ORAL_STRIP | 12 refills | Status: DC

## 2020-05-11 NOTE — Progress Notes (Signed)
HIGH-RISK PREGNANCY VISIT Patient name: Shannon Bentley MRN [id number]  Date of birth: [birth year] Chief Complaint:   Routine Prenatal Visit  History of Present Illness:   Shannon Bentley is a 30 y.o. 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  Today she reports not logging sugars, reports they are sporadically elevated. Has cut back on sweets/bread, etc.  Depression screen [facility] 2/9 03/16/2020 01/23/2020  Decreased Interest 0 0  Down, Depressed, Hopeless 0 0  PHQ - 2 Score 0 0  Altered sleeping 1 0  Tired, decreased energy 0 0  Change in appetite 0 0  Feeling bad or failure about yourself  0 0  Trouble concentrating 0 0  Moving slowly or fidgety/restless 3 0  Suicidal thoughts 0 0  PHQ-9 Score 4 0    Contractions: Not present. Vag. Bleeding: None.  Movement: Present. denies leaking of fluid.  Review of Systems:   Pertinent items are noted in HPI Denies abnormal vaginal discharge w/ itching/odor/irritation, headaches, visual changes, shortness of breath, chest pain, abdominal pain, severe nausea/vomiting, or problems with urination or bowel movements unless otherwise stated above. Pertinent History Reviewed:  Reviewed past medical,surgical, social, obstetrical and family history.  Reviewed problem list, medications and allergies. Physical Assessment:   Vitals:   05/11/20 1419  BP: 116/75  Pulse: (!) 104  Weight: 262 lb (118.8 kg)  Body mass index is 49.5 kg/m.           Physical Examination: by Albertine Grates, SNP  General appearance: alert, well appearing, and in no distress  Mental status: alert, oriented to person, place, and time  Skin: warm & dry   Extremities: Edema: None    Cardiovascular: normal heart rate noted  Respiratory: normal respiratory effort, no distress  Abdomen: gravid, soft, non-tender  Pelvic: thin prep pap obtained          Fetal Status: Fetal Heart Rate (bpm): 151 Fundal Height: 24 cm Movement: Present    Fetal Surveillance Testing today: doppler   Chaperone: me    Results for orders placed or performed in visit on 05/11/20 (from the past 24 hour(s))  POC Urinalysis Dipstick OB   Collection Time: 05/11/20  2:24 PM  Result Value Ref Range   Color, UA     Clarity, UA     Glucose, UA Negative Negative   Bilirubin, UA     Ketones, UA neg    Spec Grav, UA     Blood, UA neg    pH, UA     POC,PROTEIN,UA Negative Negative, Trace, Small (1+), Moderate (2+), Large (3+), 4+   Urobilinogen, UA     Nitrite, UA neg    Leukocytes, UA Negative Negative   Appearance     Odor      Assessment & Plan:  High-risk pregnancy: G1P0 at [redacted]w[redacted]d with an Estimated Date of Delivery: 09/12/20   1) A1/BDM dx @ 15wks, not logging sugars, sporadically elevated. To start logging on paper, send me pic via mychart in 1wk  2) Atypical findings on sex chromosomes, on [employer] screening, did not go to [facility] MFM appt. Had normal anatomy u/s here  Meds:  Meds ordered this encounter  Medications  . glucose blood (ACCU-CHEK GUIDE) test strip    Sig: Use as instructed to check blood sugar 4 times daily    Dispense:  200 each    Refill:  12    Order Specific Question:   Supervising Provider    Answer:   Lazaro Arms [[id number]]    Labs/procedures today: none  Treatment Plan: if remains off meds: Growth u/s q4wks    No antenatal testing     Deliver @ 39-40wks:____  Reviewed: Preterm labor symptoms and general obstetric precautions including but not limited to vaginal bleeding, contractions, leaking of fluid and fetal movement were reviewed in detail with the patient.  All questions were answered. Has home bp cuff. Check bp weekly, let us know if >140/90.   Follow-up: Return in about 4 weeks (around 06/08/2020) for HROB, US:EFW, in person, MD or CNM, PN2 (no sugar test).   No future appointments.  Orders Placed This Encounter   Procedures  . US OB Follow Up  . POC Urinalysis Dipstick OB   Cheral Marker CNM, [facility] 05/11/2020 3:34 PM

## 2020-05-11 NOTE — Patient Instructions (Signed)
Shannon Bentley, I greatly value your feedback.  If you receive a survey following your visit with [place] today, we appreciate you taking the time to fill it out.  Thanks, Knute Neu, CNM, WHNP-BC    [facility] [place] [facility] ([place], [place] [address]) Entrance C, located off of [address] parking  Go to [email or web address] to register for FREE online childbirth classes   Call the office [phone]) or go to [facility] if:  You begin to have strong, frequent contractions  Your water breaks.  Sometimes it is a big gush of fluid, sometimes it is just a trickle that keeps getting your panties wet or running down your legs  You have vaginal bleeding.  It is normal to have a small amount of spotting if your cervix was checked.   You don't feel your baby moving like normal.  If you don't, get you something to eat and drink and lay down and focus on feeling your baby move.   If your baby is still not moving like normal, you should call the office or go to [place] [facility]/Family Doctors:  [place] [phone]                 [place] [phone] (usually not accepting new patients unless you have family there already, you are always welcome to call and ask)       [facility] Department [phone]       [facility] Pediatricians/Family Doctors:   [facility]: [phone]  [facility]: [phone]  [facility]: [place] Doctors:   [facility]: [facility]: [place]:  [place]: [phone]   Home Blood Pressure Monitoring for Patients   Your provider has recommended that you check your blood pressure (BP) at least once a week at home. If you do not  have a blood pressure cuff at home, one will be provided for you. Contact your provider if you have not received your monitor within 1 week.   Helpful Tips for Accurate Home Blood Pressure Checks  . Don't smoke, exercise, or drink caffeine 30 minutes before checking your BP . Use the restroom before checking your BP (a full bladder can raise your pressure) . Relax in a comfortable upright chair . Feet on the ground . Left arm resting comfortably on a flat surface at the level of your heart . Legs uncrossed . Back supported . Sit quietly and don't talk . Place the cuff on your bare arm . Adjust snuggly, so that only two fingertips can fit between your skin and the top of the cuff . Check 2 readings separated by at least one minute . Keep a log of your BP readings . For a visual, please reference this diagram: http://ccnc.care/bpdiagram  Provider Name: [facility]     Phone: [phone]  Zone 1: ALL CLEAR  Continue to monitor your symptoms:  . BP reading is less than 140 (top number) or less than 90 (bottom number)  . No right upper stomach pain . No headaches or seeing spots . No feeling nauseated or throwing up . No swelling in face and hands  Zone 2: CAUTION Call your doctor's office for any of the following:  . BP reading is greater than  140 (top number) or greater than 90 (bottom number)  . Stomach pain under your ribs in the middle or right side . Headaches or seeing spots . Feeling nauseated or throwing up . Swelling in face and hands  Zone 3: EMERGENCY  Seek immediate medical care if you have any of the following:  . BP reading is greater than160 (top number) or greater than 110 (bottom number) . Severe headaches not improving with Tylenol . Serious difficulty catching your breath . Any worsening symptoms from Zone 2   Second Trimester of Pregnancy The second trimester is from week 13 through week 28, months 4 through 6. The second trimester is often a time  when you feel your best. Your body has also adjusted to being pregnant, and you begin to feel better physically. Usually, morning sickness has lessened or quit completely, you may have more energy, and you may have an increase in appetite. The second trimester is also a time when the fetus is growing rapidly. At the end of the sixth month, the fetus is about 9 inches long and weighs about 1 pounds. You will likely begin to feel the baby move (quickening) between 18 and 20 weeks of the pregnancy. BODY CHANGES Your body goes through many changes during pregnancy. The changes vary from woman to woman.   Your weight will continue to increase. You will notice your lower abdomen bulging out.  You may begin to get stretch marks on your hips, abdomen, and breasts.  You may develop headaches that can be relieved by medicines approved by your health care provider.  You may urinate more often because the fetus is pressing on your bladder.  You may develop or continue to have heartburn as a result of your pregnancy.  You may develop constipation because certain hormones are causing the muscles that push waste through your intestines to slow down.  You may develop hemorrhoids or swollen, bulging veins (varicose veins).  You may have back pain because of the weight gain and pregnancy hormones relaxing your joints between the bones in your pelvis and as a result of a shift in weight and the muscles that support your balance.  Your breasts will continue to grow and be tender.  Your gums may bleed and may be sensitive to brushing and flossing.  Dark spots or blotches (chloasma, mask of pregnancy) may develop on your face. This will likely fade after the baby is born.  A dark line from your belly button to the pubic area (linea nigra) may appear. This will likely fade after the baby is born.  You may have changes in your hair. These can include thickening of your hair, rapid growth, and changes in  texture. Some women also have hair loss during or after pregnancy, or hair that feels dry or thin. Your hair will most likely return to normal after your baby is born. WHAT TO EXPECT AT YOUR PRENATAL VISITS During a routine prenatal visit:  You will be weighed to make sure you and the fetus are growing normally.  Your blood pressure will be taken.  Your abdomen will be measured to track your baby's growth.  The fetal heartbeat will be listened to.  Any test results from the previous visit will be discussed. Your health care provider may ask you:  How you are feeling.  If you are feeling the baby move.  If you have had any abnormal symptoms, such as leaking fluid, bleeding, severe headaches, or abdominal cramping.  If  you have any questions. Other tests that may be performed during your second trimester include:  Blood tests that check for:  Low iron levels (anemia).  Gestational diabetes (between 24 and 28 weeks).  Rh antibodies.  Urine tests to check for infections, diabetes, or protein in the urine.  An ultrasound to confirm the proper growth and development of the baby.  An amniocentesis to check for possible genetic problems.  Fetal screens for spina bifida and Down syndrome. HOME CARE INSTRUCTIONS   Avoid all smoking, herbs, alcohol, and unprescribed drugs. These chemicals affect the formation and growth of the baby.  Follow your health care provider's instructions regarding medicine use. There are medicines that are either safe or unsafe to take during pregnancy.  Exercise only as directed by your health care provider. Experiencing uterine cramps is a good sign to stop exercising.  Continue to eat regular, healthy meals.  Wear a good support bra for breast tenderness.  Do not use hot tubs, steam rooms, or saunas.  Wear your seat belt at all times when driving.  Avoid raw meat, uncooked cheese, cat litter boxes, and soil used by cats. These carry germs that  can cause birth defects in the baby.  Take your prenatal vitamins.  Try taking a stool softener (if your health care provider approves) if you develop constipation. Eat more high-fiber foods, such as fresh vegetables or fruit and whole grains. Drink plenty of fluids to keep your urine clear or pale yellow.  Take warm sitz baths to soothe any pain or discomfort caused by hemorrhoids. Use hemorrhoid cream if your health care provider approves.  If you develop varicose veins, wear support hose. Elevate your feet for 15 minutes, 3-4 times a day. Limit salt in your diet.  Avoid heavy lifting, wear low heel shoes, and practice good posture.  Rest with your legs elevated if you have leg cramps or low back pain.  Visit your dentist if you have not gone yet during your pregnancy. Use a soft toothbrush to brush your teeth and be gentle when you floss.  A sexual relationship may be continued unless your health care provider directs you otherwise.  Continue to go to all your prenatal visits as directed by your health care provider. SEEK MEDICAL CARE IF:   You have dizziness.  You have mild pelvic cramps, pelvic pressure, or nagging pain in the abdominal area.  You have persistent nausea, vomiting, or diarrhea.  You have a bad smelling vaginal discharge.  You have pain with urination. SEEK IMMEDIATE MEDICAL CARE IF:   You have a fever.  You are leaking fluid from your vagina.  You have spotting or bleeding from your vagina.  You have severe abdominal cramping or pain.  You have rapid weight gain or loss.  You have shortness of breath with chest pain.  You notice sudden or extreme swelling of your face, hands, ankles, feet, or legs.  You have not felt your baby move in over an hour.  You have severe headaches that do not go away with medicine.  You have vision changes. Document Released: 01/31/2001 Document Revised: 02/11/2013 Document Reviewed: 04/09/2012 [facility] Patient  Information 2015 [place], [place]. This information is not intended to replace advice given to you by your health care provider. Make sure you discuss any questions you have with your health care provider.

## 2020-05-14 LAB — CYTOLOGY - PAP
Comment: NEGATIVE
Diagnosis: UNDETERMINED — AB
High risk HPV: NEGATIVE

## 2020-05-17 ENCOUNTER — Encounter: Payer: Self-pay | Admitting: Women's Health

## 2020-05-17 DIAGNOSIS — R87619 Unspecified abnormal cytological findings in specimens from cervix uteri: Secondary | ICD-10-CM | POA: Insufficient documentation

## 2020-06-09 ENCOUNTER — Ambulatory Visit (INDEPENDENT_AMBULATORY_CARE_PROVIDER_SITE_OTHER): Payer: 59

## 2020-06-09 ENCOUNTER — Ambulatory Visit (INDEPENDENT_AMBULATORY_CARE_PROVIDER_SITE_OTHER): Payer: 59 | Admitting: Advanced Practice Midwife

## 2020-06-09 ENCOUNTER — Other Ambulatory Visit: Payer: Self-pay | Admitting: Women's Health

## 2020-06-09 ENCOUNTER — Other Ambulatory Visit: Payer: Self-pay

## 2020-06-09 ENCOUNTER — Other Ambulatory Visit: Payer: Medicaid Other

## 2020-06-09 ENCOUNTER — Other Ambulatory Visit: Payer: 59

## 2020-06-09 VITALS — BP 120/79 | HR 100 | Wt 266.0 lb

## 2020-06-09 DIAGNOSIS — Z0375 Encounter for suspected cervical shortening ruled out: Secondary | ICD-10-CM

## 2020-06-09 DIAGNOSIS — O0992 Supervision of high risk pregnancy, unspecified, second trimester: Secondary | ICD-10-CM

## 2020-06-09 DIAGNOSIS — Z23 Encounter for immunization: Secondary | ICD-10-CM

## 2020-06-09 DIAGNOSIS — O24410 Gestational diabetes mellitus in pregnancy, diet controlled: Secondary | ICD-10-CM

## 2020-06-09 DIAGNOSIS — O0990 Supervision of high risk pregnancy, unspecified, unspecified trimester: Secondary | ICD-10-CM

## 2020-06-09 DIAGNOSIS — O285 Abnormal chromosomal and genetic finding on antenatal screening of mother: Secondary | ICD-10-CM

## 2020-06-09 DIAGNOSIS — O99213 Obesity complicating pregnancy, third trimester: Secondary | ICD-10-CM

## 2020-06-09 DIAGNOSIS — Z3A26 26 weeks gestation of pregnancy: Secondary | ICD-10-CM

## 2020-06-09 NOTE — Progress Notes (Signed)
   HIGH-RISK PREGNANCY VISIT Patient name: Shannon Bentley MRN [id number]  Date of birth: 1990/11/08 Chief Complaint:   Routine Prenatal Visit  History of Present Illness:   Shannon Bentley is a 30 y.o. G1P0 female at 48w3dwith an Estimated Date of Delivery: 09/12/20 being seen today for ongoing management of a high-risk pregnancy complicated by Class AH3/ZDM (dx at 15 weeks) Today she reports not checking sugars because she doesn't have her medicaid card to get supplies.. Contractions: Not present. Vag. Bleeding: None.  Movement: Present.  Review of Systems:   Pertinent items are noted in HPI Denies abnormal vaginal discharge w/ itching/odor/irritation, headaches, visual changes, shortness of breath, chest pain, abdominal pain, severe nausea/vomiting, or problems with urination or bowel movements unless otherwise stated above. Pertinent History Reviewed:  Reviewed past medical,surgical, social, obstetrical and family history.  Reviewed problem list, medications and allergies. Physical Assessment:   Vitals:   06/09/20 1023  BP: 120/79  Pulse: 100  Weight: 266 lb (120.7 kg)  Body mass index is 50.26 kg/m.           Physical Examination:   General appearance: alert, well appearing, and in no distress  Mental status: alert, oriented to person, place, and time  Skin: warm & dry   Extremities: Edema: None    Cardiovascular: normal heart rate noted  Respiratory: normal respiratory effort, no distress  Abdomen: gravid, soft, non-tender  Pelvic: Cervical exam deferred         Fetal Status: Fetal Heart Rate (bpm): u[place]  Movement: Present    Fetal Surveillance Testing today  :  [place]216+9wks,cephalic,posterior placenta gr 0,svp of fluid 6.3 cm,normal right ovary,echogenic mass left ovary 2.9 x 2.1 x 2 cm (? Dermoid),fhr 152 bpm,cervical funneling seen on T/A images,funnel length 4.2 cm,normal cx length on TV with a small amount of  fluid w/in cervical canal 3.3 -3.9 cm,EFW 902 g 29%  No results  found for this or any previous visit (from the past 24 hour(s)).  Assessment & Plan:  1) High-risk pregnancy G1P0 at 232w3dith an Estimated Date of Delivery: 09/12/20   2) Abnormal sex chromosomes, Monosomy x: no showed for MFM consult Given number to call and reschedule  3)  A1/B DM:  Not sure d/t not checking sugars.  Given rx to take to [facility] for supplies until medicaid card comes in.   Meds: No orders of the defined types were placed in this encounter.     Reviewed: Preterm labor symptoms and general obstetric precautions including but not limited to vaginal bleeding, contractions, leaking of fluid and fetal movement were reviewed in detail with the patient.  All questions were answered. Has home bp cuff.. Check bp weekly, let usKoreanow if >140/90.   Follow-up: No follow-ups on file.  No future appointments.  Orders Placed This Encounter  Procedures  . Tdap vaccine greater than or equal to 7yo IM   FrChristin FudgeNP, CNM 06/09/2020 10:45 AM

## 2020-06-09 NOTE — Progress Notes (Signed)
[place] 44+0 wks,cephalic,posterior placenta gr 0,svp of fluid 6.3 cm,normal right ovary,echogenic mass left ovary 2.9 x 2.1 x 2 cm (? Dermoid),fhr 152 bpm,cervical funneling seen on T/A images,funnel length 4.2 cm,normal cx length on TV with a small amount of  fluid w/in cervical canal 3.3 -3.9 cm,EFW 902 g 29%

## 2020-06-09 NOTE — Patient Instructions (Signed)
Please call Maternal Fetal Medicine to reschedule you consult appointment and ultrasound with them.  [phone].

## 2020-06-10 LAB — CBC
Hematocrit: 33.5 % — ABNORMAL LOW (ref 34.0–46.6)
Hemoglobin: 10.9 g/dL — ABNORMAL LOW (ref 11.1–15.9)
MCH: 26.9 pg (ref 26.6–33.0)
MCHC: 32.5 g/dL (ref 31.5–35.7)
MCV: 83 fL (ref 79–97)
Platelets: 328 10*3/uL (ref 150–450)
RBC: 4.05 x10E6/uL (ref 3.77–5.28)
RDW: 13.9 % (ref 11.7–15.4)
WBC: 9 10*3/uL (ref 3.4–10.8)

## 2020-06-10 LAB — RPR: RPR Ser Ql: NONREACTIVE

## 2020-06-10 LAB — ANTIBODY SCREEN: Antibody Screen: NEGATIVE

## 2020-06-10 LAB — HIV ANTIBODY (ROUTINE TESTING W REFLEX): HIV Screen 4th Generation wRfx: NONREACTIVE

## 2020-06-23 ENCOUNTER — Other Ambulatory Visit: Payer: Self-pay

## 2020-06-23 ENCOUNTER — Ambulatory Visit (INDEPENDENT_AMBULATORY_CARE_PROVIDER_SITE_OTHER): Payer: 59 | Admitting: Obstetrics & Gynecology

## 2020-06-23 ENCOUNTER — Encounter: Payer: Self-pay | Admitting: Obstetrics & Gynecology

## 2020-06-23 VITALS — BP 115/67 | HR 100 | Wt 269.0 lb

## 2020-06-23 DIAGNOSIS — O0993 Supervision of high risk pregnancy, unspecified, third trimester: Secondary | ICD-10-CM

## 2020-06-23 NOTE — Progress Notes (Signed)
HIGH-RISK PREGNANCY VISIT Patient name: Shannon Bentley MRN [id number]  Date of birth: February 02, 1991 Chief Complaint:   Routine Prenatal Visit  History of Present Illness:   Lizett Chowning is a 30 y.o. G1P0 female at 74w3dwith an Estimated Date of Delivery: 09/12/20 being seen today for ongoing management of a high-risk pregnancy complicated by  1) GOINO6/V2) Obesity  3) Crohn's-  S/p right hemicolectomy- followed by GI, no meds currently   Today she reports no complaints.   Contractions: Not present. Vag. Bleeding: None.  Movement: Present. denies leaking of fluid.   [facility] 06/09/2020 03/16/2020 01/23/2020  Decreased Interest 0 0 0  Down, Depressed, Hopeless 0 0 0  PHQ - 2 Score 0 0 0  Altered sleeping 0 1 0  Tired, decreased energy 1 0 0  Change in appetite 0 0 0  Feeling bad or failure about yourself  0 0 0  Trouble concentrating 0 0 0  Moving slowly or fidgety/restless 0 3 0  Suicidal thoughts 0 0 0  PHQ-9 Score 1 4 0     Current Outpatient Medications  Medication Instructions  . Accu-Chek Softclix Lancets lancets Use as instructed to check blood sugar 4 times daily  . Ascorbic Acid (VITA-C PO) 1 tablet, Oral, Daily  . aspirin 81 mg, Oral, Daily, Swallow whole.  . Blood Glucose Monitoring Suppl (ACCU-CHEK GUIDE ME) w/Device KIT 1 each, Does not apply, 4 times daily  . calcium carbonate (TUMS) 500 MG chewable tablet 200 mg of elemental calcium, Oral, 3 times daily  . clobetasol cream (TEMOVATE) 06.72% 1 application, Topical, 2 times daily  . Elastic Bandages & Supports (COMFORT FIT MATERNITY SUPP LG) MISC 1 Device, Does not apply, As needed  . ELDERBERRY PO 1 tablet, Oral, Daily  . Ferrous Sulfate (IRON PO) Daily  . fluticasone (FLONASE) 50 MCG/ACT nasal spray 2 sprays into each nostril daily.  . folic acid (FOLVITE) 4094mcg, Oral, Daily  . glucose blood (ACCU-CHEK GUIDE) test strip Use as instructed to check blood sugar 4 times daily  . Prenatal Vit-Fe  Fumarate-FA (PRENATAL VITAMIN PO) Oral, Daily  . Vitamin D3 5,000 Units, Oral, Daily     Review of Systems:   Pertinent items are noted in HPI Denies abnormal vaginal discharge w/ itching/odor/irritation, headaches, visual changes, shortness of breath, chest pain, abdominal pain, severe nausea/vomiting, or problems with urination or bowel movements unless otherwise stated above. Pertinent History Reviewed:  Reviewed past medical,surgical, social, obstetrical and family history.  Reviewed problem list, medications and allergies. Physical Assessment:   Vitals:   06/23/20 1541  BP: 115/67  Pulse: 100  Weight: 269 lb (122 kg)  Body mass index is 50.83 kg/m.           Physical Examination:   General appearance: alert, well appearing, and in no distress  Mental status: normal mood, behavior, speech, dress, motor activity, and thought processes  Skin: warm & dry   Extremities: Edema: None    Cardiovascular: normal heart rate noted  Respiratory: normal respiratory effort, no distress  Abdomen: gravid, soft, non-tender  Pelvic: Cervical exam deferred         Fetal Status: Fetal Heart Rate (bpm): 150 Fundal Height: 28 cm Movement: Present    Fetal Surveillance Testing today: doppler   Chaperone: N/A    No results found for this or any previous visit (from the past 24 hour(s)).   Assessment & Plan:  High-risk pregnancy: G1P0 at 239w3dith an Estimated  Date of Delivery: 09/12/20   1) GDMA1/B Mostly controlled- reviewed log Reviewed diet changes as she is still on occasion eating pizza or donuts Growth q 4wks- to be completed at next visit  2) Abnormal sex chromsomes- monsomy X- has not seen MFM  -Crohn's- stable   Meds: No orders of the defined types were placed in this encounter.   Labs/procedures today: PN-2 labs  Treatment Plan:  Continue routine prenatal care  Reviewed: Preterm labor symptoms and general obstetric precautions including but not limited to vaginal  bleeding, contractions, leaking of fluid and fetal movement were reviewed in detail with the patient.  All questions were answered. Pt has home bp cuff. Check bp weekly, let us know if >140/90.   Follow-up: Return in about 2 weeks (around 07/07/2020) for HROB visit and growth scan.   No future appointments.  No orders of the defined types were placed in this encounter.   Janyth Pupa, DO Attending [place], [facility] for [employer], [place]

## 2020-07-08 ENCOUNTER — Other Ambulatory Visit: Payer: 59

## 2020-07-08 ENCOUNTER — Other Ambulatory Visit: Payer: Self-pay

## 2020-07-08 ENCOUNTER — Ambulatory Visit (INDEPENDENT_AMBULATORY_CARE_PROVIDER_SITE_OTHER): Payer: 59 | Admitting: Obstetrics & Gynecology

## 2020-07-08 ENCOUNTER — Encounter: Payer: Self-pay | Admitting: Obstetrics & Gynecology

## 2020-07-08 ENCOUNTER — Ambulatory Visit: Payer: Medicaid Other | Attending: Obstetrics & Gynecology | Admitting: Genetic Counselor

## 2020-07-08 VITALS — BP 117/76 | HR 104 | Wt 265.0 lb

## 2020-07-08 DIAGNOSIS — Z315 Encounter for genetic counseling: Secondary | ICD-10-CM | POA: Diagnosis not present

## 2020-07-08 DIAGNOSIS — O285 Abnormal chromosomal and genetic finding on antenatal screening of mother: Secondary | ICD-10-CM

## 2020-07-08 DIAGNOSIS — O0993 Supervision of high risk pregnancy, unspecified, third trimester: Secondary | ICD-10-CM

## 2020-07-08 DIAGNOSIS — D563 Thalassemia minor: Secondary | ICD-10-CM

## 2020-07-08 DIAGNOSIS — Z1389 Encounter for screening for other disorder: Secondary | ICD-10-CM

## 2020-07-08 LAB — POCT URINALYSIS DIPSTICK OB
Blood, UA: NEGATIVE
Glucose, UA: NEGATIVE
Leukocytes, UA: NEGATIVE
Nitrite, UA: NEGATIVE

## 2020-07-08 NOTE — Progress Notes (Signed)
07/08/2020  Shannon Bentley 04/03/1990 MRN: [id number] DOV: 07/08/2020  Shannon Bentley presented to the [facility] for a genetics consultation regarding her abnormal noninvasive prenatal screening (NIPS) result and her carrier status for alpha-thalassemia. Shannon Bentley was accompanied to her appointment by her Bentley.   Indication for genetic counseling - NIPS atypical finding on the sex chromosomes of suspected fetal/placental origin - Silent carrier for alpha-thalassemia  Prenatal history  Shannon Bentley is a G19P0, 30 y.o. female. Her current pregnancy has completed [redacted]w[redacted]d(Estimated Date of Delivery: 09/12/20).  Ms. JPeetersdenied exposure to environmental toxins or chemical agents. She denied the use of alcohol, tobacco or street drugs. She reported taking prenatal vitamins, elderberry, vitamin C, D3, calcium, and baby aspirin. She has also been using a topical eczema cream. She reported an episode of bleeding around 5 months' gestation following a day of heavy lifting at work. She does not do as many physically demanding tasks at her new job. She also had COVID around 3 months' gestation with a 103 fever degree. She reported a personal history of anemia and Crohn's disease. Her medical and surgical histories were otherwise noncontributory.  Family History  A three generation pedigree was drafted and reviewed. The family history is remarkable for the following:  - Shannon Bentley half brother was born with a heart murmur. [place] any treatment or surgery. The exact etiology of his murmur is unknown. Thus, risk assessment is limited.  - Shannon Bentley whose son was born with pectus and a heart murmur. He reportedly had short stature early in life but later became very tall. We briefly reviewed Marfan syndrome as a condition that can be associated with  pectus abnormalities, heart murmurs, and tall stature. Shannon Bentley did not know if their relative has Marfan syndrome. Without further information, risk assessment is limited.  The remaining family histories were reviewed and found to be noncontributory for birth defects, intellectual disability, recurrent pregnancy loss, and known genetic conditions. The father of the pregnancy was adopted and has limited family history information; thus, risk assessment was limited.   The patient's ancestry is African American. The father of the pregnancy's ancestry is African American. Ashkenazi Jewish ancestry and consanguinity were denied. Pedigree will be scanned under Media.  Discussion  NIPS result:  Ms. JLandeckwas referred for a genetic counseling consultation as she had [facility] noninvasive prenatal [place] ([place]) [place] an atypical finding on the sex chromosomes suspected to originate from the pregnancy. These results also showed a less than 1 in 10,000 risk for trisomies 21, 18 and 13, a low risk for triploidy, and a 1 in 12000 risk for 22q11.2 deletion syndrome.  We reviewed that NIPS analyzes cell-free fetal DNA from the placenta found in the maternal bloodstream during pregnancy. NIPS is used to determine the risk for missing or extra placental chromosomal material for a specific subset of chromosomes. Based on this, we discussed that there are several possibilities that could warrant Ms. JDobratzabnormal NIPS result. One possibility is the fetus having a genetic change involving the sex chromosomes. A second possibility is fetal mosaicism for a change involving the sex chromosomes. Mosaicism occurs when not every cell in the body is genetically identical; some cells in the body may have a genetic change involving the sex chromosomes, whereas others may have normal  sex chromosomes or a different abnormal chromosomal complement. A third possibility is that of  confined placental mosaicism, or a result representative of the placenta only rather than the fetus. Finally, it is possible that this is a false positive result for reasons unknown.   We reviewed that even if the fetus were to have a sex chromosome abnormality, it would not be possible for [place] to predict if this would be a benign change or one that would have implications for health or development based on this NIPS result alone. We briefly reviewed monosomy X (Turner syndrome) and mosaic monosomy X as examples of sex chromosome abnormalities that can have clinical implications. We also reviewed the limitations of NIPS, including the fact that it is not diagnostic and that it does not identify all genetic conditions. We discussed that without knowing the precise reason for this abnormal NIPS result, we are limited in understanding if there may be any potentially clinically relevant concerns for the fetus.  Additional screening/testing options:  Shannon Bentley was scheduled for a detailed anatomy ultrasound in Maternal Fetal Medicine back in March; however, she did not attend this appointment. She had a complete anatomy ultrasound with her OBGYN provider that reportedly did not demonstrate any obvious anomalies, although with some limited views. We discussed that while some fetuses with a sex chromosome abnormality may demonstrate features of the condition on ultrasound, a normal ultrasound cannot rule out the possibility of a sex chromosome abnormality in a fetus. We also discussed that Shannon Bentley may still have a detailed ultrasound in MFM; however, views will likely be limited due to her advanced gestational age. Shannon Bentley understood the limitations of ultrasound and opted to schedule a detailed scan in MFM.  We reviewed additional available testing options to attempt to get more information to clarify the NIPS finding. Firstly, Shannon Bentley was informed that diagnostic testing via amniocentesis would be  the only way to determine if the fetus has a chromosomal abnormality involving the sex chromosomes prenatally. We discussed the technical aspects of the procedure and quoted up to a 1 in 500 (0.2%) risk for preterm labor or other adverse pregnancy outcomes as a result of amniocentesis. Cultured cells from an amniocentesis sample allow for the visualization of a fetal karyotype, which can detect >99% of large chromosomal aberrations. Chromosomal microarray can also be performed to identify smaller deletions or duplications of fetal chromosomal material that fall below the resolution of karyotype. Ms. Glennie was informed that if a sex chromosome abnormality were identified, we would then be able to research whether the finding is a benign change or one that could have implications for the fetus's postnatal health or development.    Ms. Marcelli was also made aware that should amniocentesis not be desired, she can continue with standard ultrasounds to monitor for fetal anomalies. She may wait to pursue genetic testing postnatally if desired.    After careful consideration, Ms. Silvester declined amniocentesis at this time. She understands that amniocentesis is available at any point after 16 weeks of pregnancy and that she may opt to undergo the procedure at a later date should she change her mind.  Alpha-thalassemia:  Ms. Piotrowski was also briefly counseled on her silent carrier status for alpha-thalassemia (aa/a-). Alpha-thalassemia is different in its inheritance compared to other hemoglobinopathies as there are two copies of two alpha globin genes (HBA1 and HBA2) on each chromosome 16, or four alpha globin genes total (aa/aa). A person can be a carrier of one alpha  gene mutation (aa/a-), also referred to as a "silent carrier". A person who carries two alpha globin gene mutations can either carry them in cis (both on the same chromosome, denoted as aa/--) or in trans (on different chromosomes, denoted as  a-/a-). Alpha-thalassemia carriers of two mutations who have African American ancestry are more likely to have a trans arrangement (a-/a-); cis configuration is reported to be rare in individuals with African American ancestry.     There are several different forms of alpha-thalassemia. The most severe form of alpha-thalassemia, Hb Barts, is associated with an absence of alpha globin chain synthesis as a result of deletions of all four alpha globin genes (--/--).  Given that Ms. Mccall is a silent carrier (aa/a-), her pregnancies would not be at increased risk for Hb Barts, even if her partner is a carrier for alpha-thalassemia, as she will always pass on at least one copy of the alpha globin gene to her children. Hemoglobin H (HbH) disease is caused by three deleted or dysfunctioning alpha globin alleles (a-/--) and is characterized by microcytic hypochromic hemolytic anemia, hepatosplenomegaly, mild jaundice, growth retardation, and sometimes thalassemia-like bone changes. Given Ms. Feliz silent carrier status (aa/a-), the current fetus would only be at risk for HbH disease (a-/--), if her partner is a carrier for two alpha globin mutations in cis (aa/--). If this is the case, the risk for HbH disease in the pregnancy would be 1 in 4 (25%). However, if Ms. Mira partner is a carrier for two alpha globin mutations, he would be more likely to carry them in a trans configuration (a-/a-) than the cis configuration (aa/--), given his ethnicity. If he is a carrier of alpha-thalassemia in trans, then the pregnancy would not be at increased risk for HbH disease. Based on the carrier frequency for alpha-thalassemia in the African American population, Ms. Brinson partner has a 1 in 30 chance of being any type of carrier for alpha-thalassemia. Thus, the couple has a <1% chance to have a fetus with HbH disease.  Ms. Rau [id number] carrier screening was negative for the other 26 conditions screened. Thus,  her risk to be a carrier for these additional conditions (listed separately in the laboratory report) has been reduced but not eliminated. This also significantly reduces her risk of having a child affected by one of these conditions. We discussed that carrier testing for alpha-thalassemia is recommended for Ms. [place] partner. Ms. Fernandez indicated that her partner will likely not be interested in pursuing carrier screening.  Plan:  Partner carrier screening and diagnostic testing were declined today. Ms. Livesay understands that screening tests, including ultrasound, cannot rule out all birth defects or genetic syndromes. She may opt to pursue postnatal genetic testing to determine whether or not the baby has a sex chromosome abnormality. In the meantime she will return to [facility]FM on 07/28/20 for a detailed anatomy ultrasound.  I counseled Ms. Amero regarding the above risks and available options. The approximate face-to-face time with the genetic counselor was 70 minutes.  In summary:  Discussed NIPS result  Atypical finding on sex chromosomes of suspected placental/fetal origin  Reduction in risk for Down syndrome, trisomy 50, trisomy 42, triploidy, and 22q11.2 deletion syndrome  Discussed carrier screening results  Silent carrier for alpha-thalassemia  FOB likely not interested in partner carrier screening  Offered additional testing and screening  Will return to [facility] for anatomy ultrasound on 6/8 (missed original appointment in March)  Declined amniocentesis  May pursue postnatal testing for sex chromosome  abnormalities in baby if indicated/interested  Reviewed family history concerns   Buelah Manis, MS, Counselling psychologist

## 2020-07-08 NOTE — Progress Notes (Signed)
HIGH-RISK PREGNANCY VISIT Patient name: Shannon Bentley MRN [id number]  Date of birth: [birth year] Chief Complaint:   Routine Prenatal Visit and High Risk Gestation  History of Present Illness:   Shannon Bentley is a 30 y.o. 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  Today she reports no complaints.  [facility] 06/09/2020 03/16/2020 01/23/2020  Decreased Interest 0 0 0  Down, Depressed, Hopeless 0 0 0  PHQ - 2 Score 0 0 0  Altered sleeping 0 1 0  Tired, decreased energy 1 0 0  Change in appetite 0 0 0  Feeling bad or failure about yourself  0 0 0  Trouble concentrating 0 0 0  Moving slowly or fidgety/restless 0 3 0  Suicidal thoughts 0 0 0  PHQ-9 Score 1 4 0    Contractions: Not present. Vag. Bleeding: None.  Movement: Present. denies leaking of fluid.  Review of Systems:   Pertinent items are noted in HPI Denies abnormal vaginal discharge w/ itching/odor/irritation, headaches, visual changes, shortness of breath, chest pain, abdominal pain, severe nausea/vomiting, or problems with urination or bowel movements unless otherwise stated above. Pertinent History Reviewed:  Reviewed past medical,surgical, social, obstetrical and family history.  Reviewed problem list, medications and allergies. Physical Assessment:   Vitals:   07/08/20 0943  BP: 117/76  Pulse: (!) 104  Weight: 265 lb (120.2 kg)  Body mass index is 50.07 kg/m.           Physical Examination:   General appearance: alert, well appearing, and in no distress   Mental status: alert, oriented to person, place, and time  Skin: warm & dry   Extremities: Edema: Trace    Cardiovascular: normal heart rate noted  Respiratory: normal respiratory effort, no distress  Abdomen: gravid, soft, non-tender  Pelvic: Cervical exam deferred         Fetal Status:      Movement: Present    Fetal Surveillance Testing today:    Chaperone: N/A    Results for orders placed or performed in visit on 07/08/20 (from the past 24 hour(s))  POC Urinalysis Dipstick OB   Collection Time: 07/08/20  9:45 AM  Result Value Ref Range   Color, UA     Clarity, UA     Glucose, UA Negative Negative   Bilirubin, UA     Ketones, UA mod    Spec Grav, UA     Blood, UA neg    pH, UA     POC,PROTEIN,UA Trace Negative, Trace, Small (1+), Moderate (2+), Large (3+), 4+   Urobilinogen, UA     Nitrite, UA neg    Leukocytes, UA Negative Negative   Appearance     Odor      Assessment & Plan:  High-risk pregnancy: G1P0 at 332w4dith an Estimated Date of Delivery: 09/12/20   1) A1/B DM, stable, good control  2) 45X0, is supposed to have her genetic counselling today, I counselled regarding the major Turner's issues  3)Crohn's is behaving  Meds: No orders of the defined types were placed in this encounter.   Labs/procedures today: none  Treatment Plan:  EFW 36 weeks  Reviewed: Preterm labor symptoms and general obstetric precautions including but not limited to vaginal bleeding, contractions, leaking of fluid and fetal movement were reviewed in detail with the patient.  All questions were answered. Does have  home bp cuff. Office bp cuff given: not applicable. Check bp weekly, let us know if consistently >140 and/or >90.  Follow-up: Return for [place].   Future Appointments  Date Time Provider [place]  07/08/2020  2:30 PM [facility] GENETIC COUNSELING RM [facility]  08/03/2020  3:45 PM [place] - [facility] [place] [facility] None  08/03/2020  4:15 PM Florian Buff, MD [facility]  08/31/2020  3:30 PM [place] - [facility] [place] [facility] None  08/31/2020  4:10 PM Colten Desroches, Mertie Clause, MD [facility]    Orders Placed This Encounter  Procedures  . POC Urinalysis Dipstick OB   [name]  07/08/2020 10:05 AM

## 2020-07-09 ENCOUNTER — Other Ambulatory Visit: Payer: Self-pay | Admitting: *Deleted

## 2020-07-09 DIAGNOSIS — O280 Abnormal hematological finding on antenatal screening of mother: Secondary | ICD-10-CM

## 2020-07-12 ENCOUNTER — Ambulatory Visit: Payer: Self-pay

## 2020-07-13 IMAGING — CT CT ABDOMEN AND PELVIS WITH CONTRAST
2 of 4 series · 16 of 46 positions shown, 18 images · IV contrast (Isovue)
Comparison: Radiograph 10/12/2017.  CT 05/13/2017

CLINICAL DATA: Crohn's exacerbation. Abdominal pain, onset today.
Emesis and diarrhea.

EXAM:
CT ABDOMEN AND PELVIS WITH CONTRAST
TECHNIQUE: Multidetector CT imaging of the abdomen and pelvis was performed
using the standard protocol following bolus administration of
intravenous contrast.
CONTRAST:  100mL OMNIPAQUE IOHEXOL 300 MG/ML  SOLN

[Series 2: axial st · axial · 0.82mm/px · z∈[+813,+1248]mm · 13 of 99 slices shown, 15 images]
[im 6/99  soft-tissue]
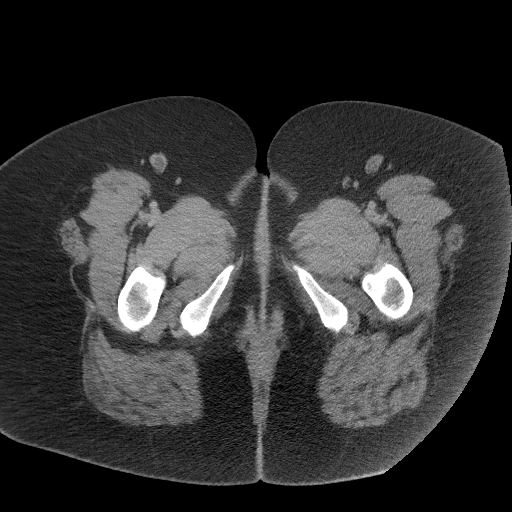
[im 6/99  bone]
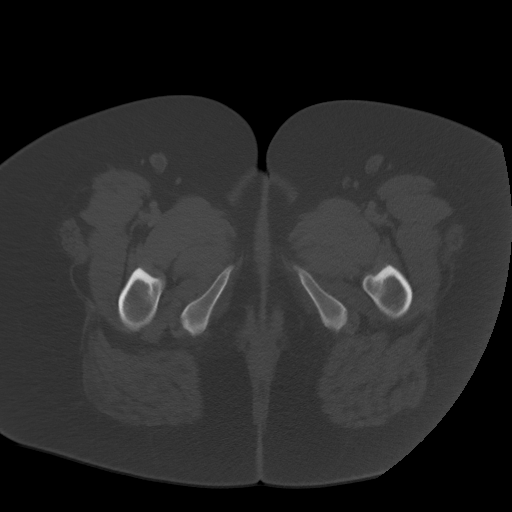
[im 11/99  soft-tissue]
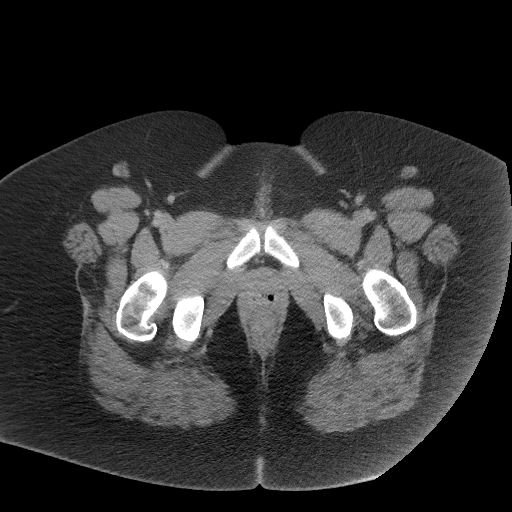
[im 22/99  soft-tissue]
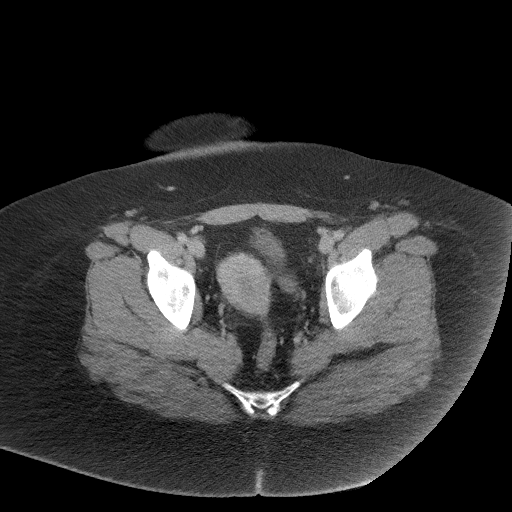
[im 28/99  soft-tissue]
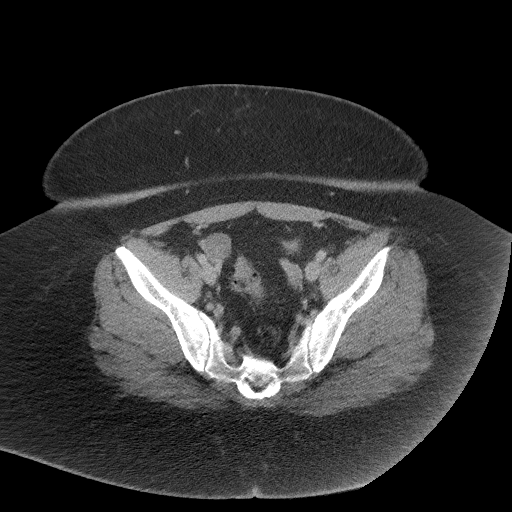
[im 33/99  soft-tissue]
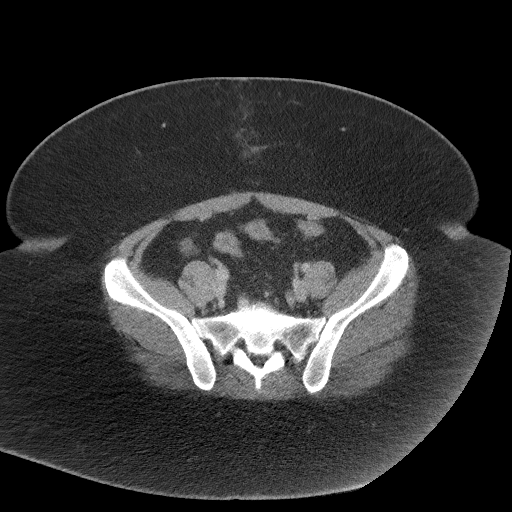
[im 44/99  soft-tissue]
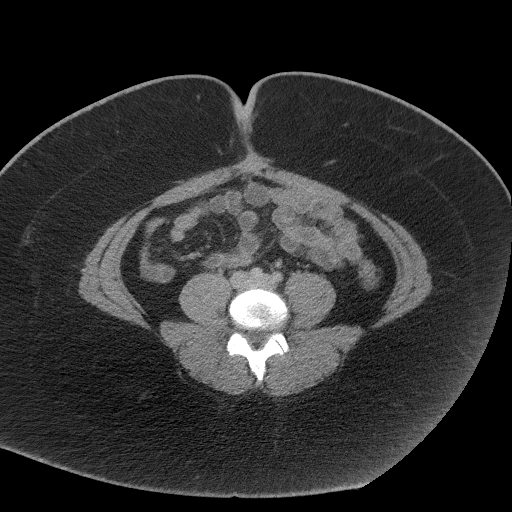
[im 50/99  soft-tissue]
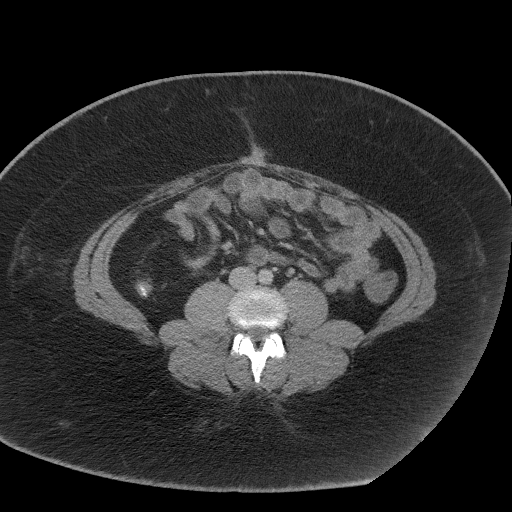
[im 55/99  soft-tissue]
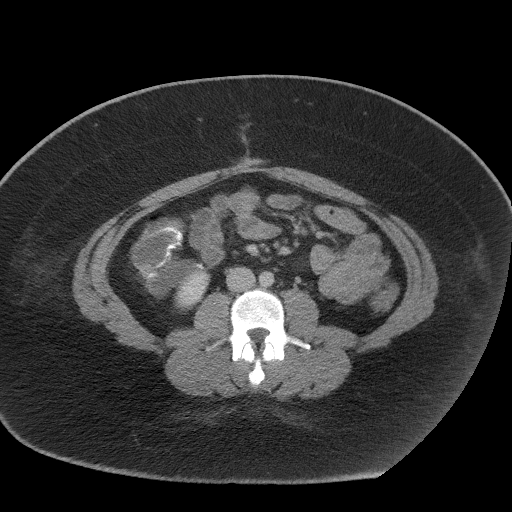
[im 66/99  soft-tissue]
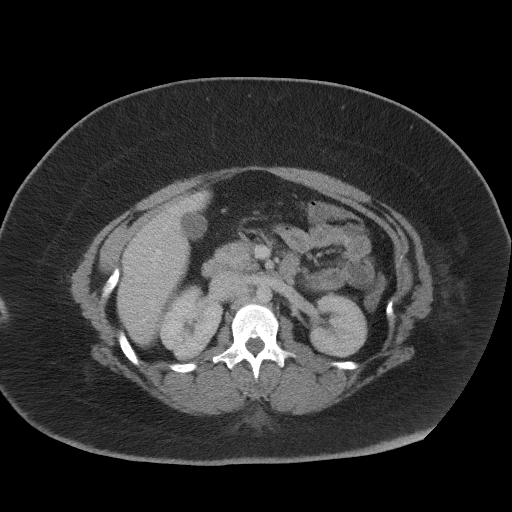
[im 66/99  bone]
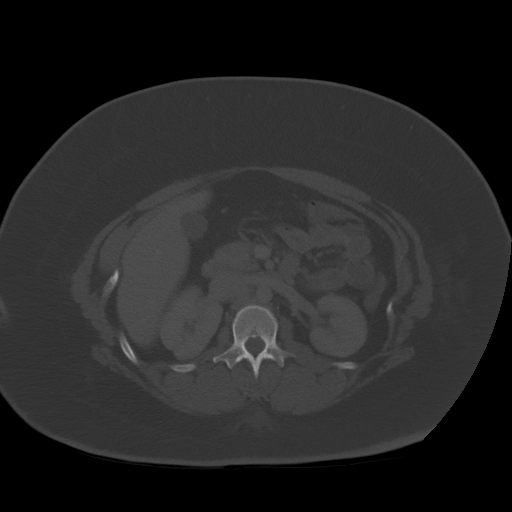
[im 71/99  soft-tissue]
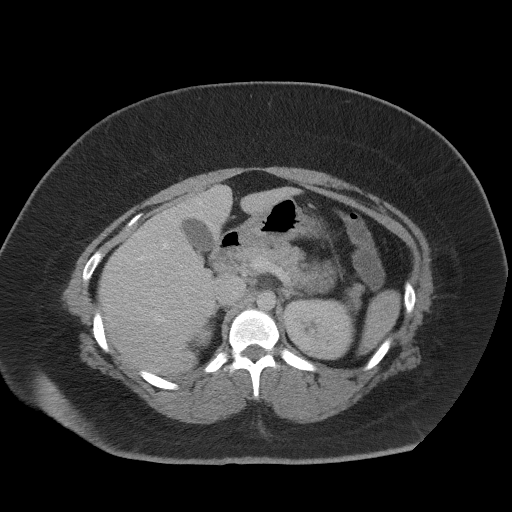
[im 77/99  soft-tissue]
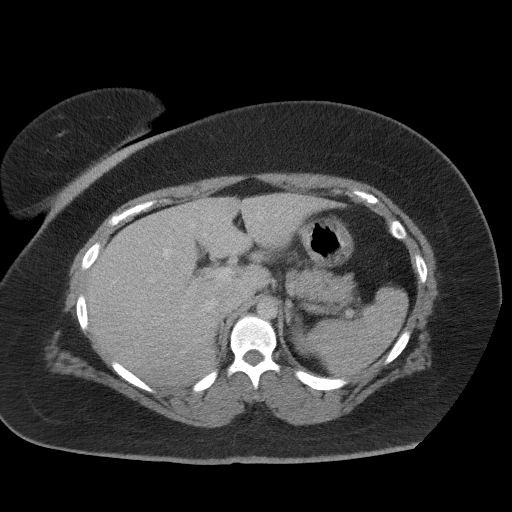
[im 88/99  soft-tissue]
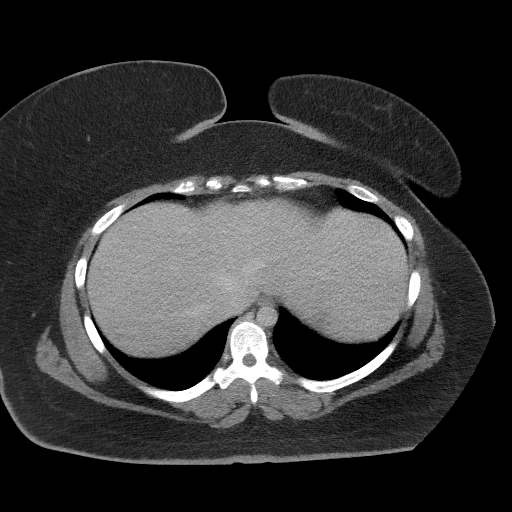
[im 93/99  soft-tissue]
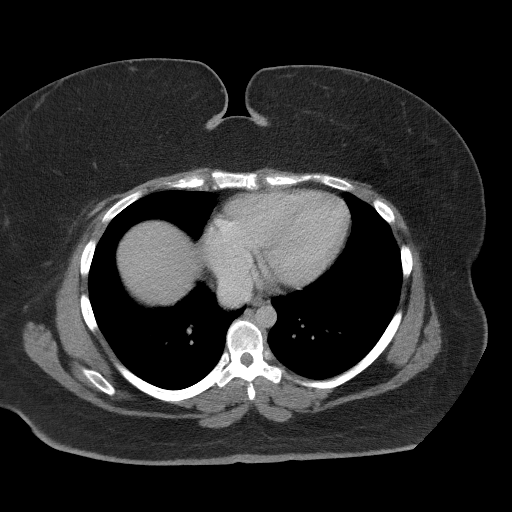

[Series 5: coronal st · coronal · 0.96mm/px · 3 of 123 slices shown]
[im 41/123  soft-tissue]
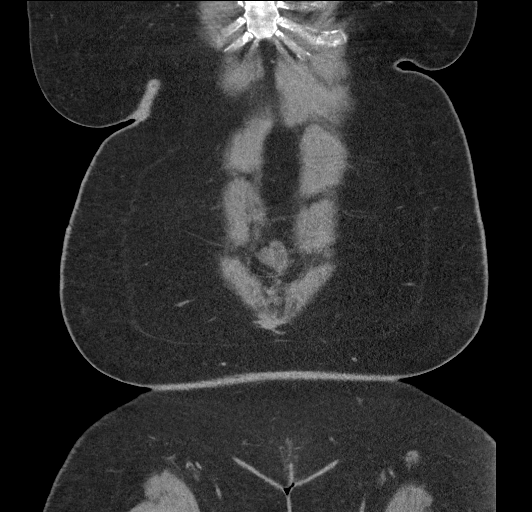
[im 55/123  soft-tissue]
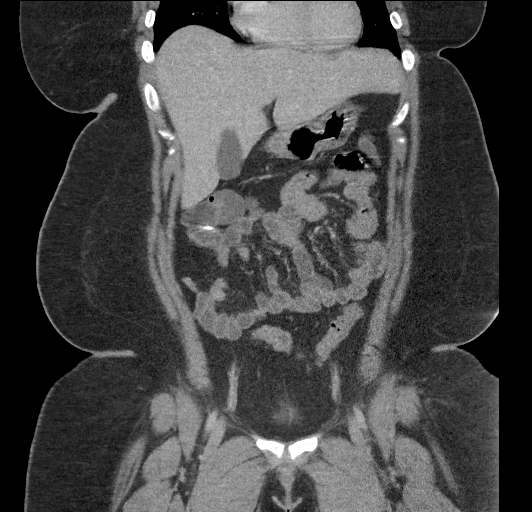
[im 68/123  soft-tissue]
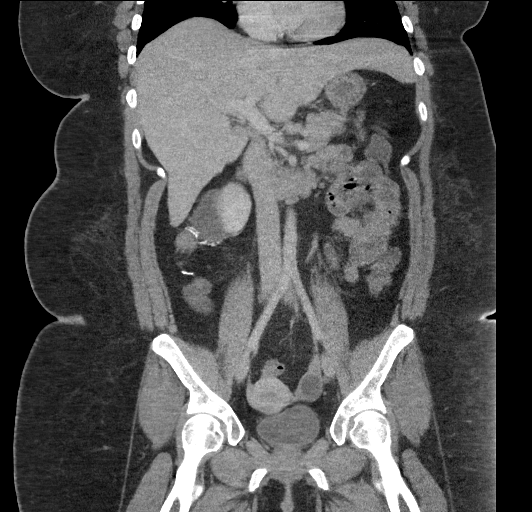

[16 of 46 positions shown; findings below may reference images not displayed]

FINDINGS: Lower chest: No pleural fluid or consolidation.

Hepatobiliary: No focal liver abnormality is seen. No gallstones,
gallbladder wall thickening, or biliary dilatation.

Pancreas: No ductal dilatation or inflammation.

Spleen: Normal in size without focal abnormality.

Adrenals/Urinary Tract: Normal adrenal glands. No hydronephrosis or
perinephric edema. Homogeneous renal enhancement. Urinary bladder is
partially distended without wall thickening.

Stomach/Bowel: Stomach is nondistended. No gastric wall thickening.
No evidence of obstruction. Small bowel fluid-filled diffusely but
nondilated. Questionable mild wall thickening of small bowel loop in
the left mid abdomen with adjacent mesenteric edema. Prior
iliocecectomy. Liquid stool/fluid-filled ascending, transverse, and
descending colon, but without wall thickening or surrounding
stranding. No definite colonic wall thickening or inflammatory
change.

Vascular/Lymphatic: Circumaortic left renal vein. Normal caliber
abdominal aorta. Patent portal and mesenteric veins. Small ileocolic
lymph nodes are likely reactive

Reproductive: Uterus and bilateral adnexa are unremarkable.

Other: Postsurgical changes the anterior abdominal wall. No
significant free fluid. No free air. No intra-abdominal abscess.

Musculoskeletal: There are no acute or suspicious osseous
abnormalities.
IMPRESSION: 1. Liquid stool throughout the colon but no inflammatory changes or
wall thickening to suggest active Crohn's disease in the colon.
2. Fluid-filled nondilated and nonobstructed small bowel. Suggestion
of mild wall thickening and edema involving short-segment bowel loop
in the left abdomen which may represent site of active Crohn's.

## 2020-07-20 ENCOUNTER — Other Ambulatory Visit: Payer: 59

## 2020-07-20 ENCOUNTER — Encounter: Payer: 59 | Admitting: Obstetrics & Gynecology

## 2020-07-23 ENCOUNTER — Other Ambulatory Visit: Payer: 59

## 2020-07-28 ENCOUNTER — Ambulatory Visit: Payer: Medicaid Other | Admitting: *Deleted

## 2020-07-28 ENCOUNTER — Other Ambulatory Visit: Payer: Self-pay | Admitting: *Deleted

## 2020-07-28 ENCOUNTER — Other Ambulatory Visit: Payer: Self-pay

## 2020-07-28 ENCOUNTER — Encounter: Payer: Self-pay | Admitting: *Deleted

## 2020-07-28 ENCOUNTER — Ambulatory Visit: Payer: Medicaid Other | Attending: Maternal & Fetal Medicine

## 2020-07-28 DIAGNOSIS — O321XX0 Maternal care for breech presentation, not applicable or unspecified: Secondary | ICD-10-CM

## 2020-07-28 DIAGNOSIS — O99513 Diseases of the respiratory system complicating pregnancy, third trimester: Secondary | ICD-10-CM

## 2020-07-28 DIAGNOSIS — O24410 Gestational diabetes mellitus in pregnancy, diet controlled: Secondary | ICD-10-CM

## 2020-07-28 DIAGNOSIS — O289 Unspecified abnormal findings on antenatal screening of mother: Secondary | ICD-10-CM

## 2020-07-28 DIAGNOSIS — O99213 Obesity complicating pregnancy, third trimester: Secondary | ICD-10-CM | POA: Insufficient documentation

## 2020-07-28 DIAGNOSIS — E669 Obesity, unspecified: Secondary | ICD-10-CM | POA: Diagnosis not present

## 2020-07-28 DIAGNOSIS — O0990 Supervision of high risk pregnancy, unspecified, unspecified trimester: Secondary | ICD-10-CM | POA: Diagnosis present

## 2020-07-28 DIAGNOSIS — O280 Abnormal hematological finding on antenatal screening of mother: Secondary | ICD-10-CM | POA: Insufficient documentation

## 2020-07-28 DIAGNOSIS — Z3A33 33 weeks gestation of pregnancy: Secondary | ICD-10-CM

## 2020-07-28 DIAGNOSIS — K5090 Crohn's disease, unspecified, without complications: Secondary | ICD-10-CM

## 2020-07-30 ENCOUNTER — Other Ambulatory Visit: Payer: 59

## 2020-08-02 ENCOUNTER — Other Ambulatory Visit: Payer: Self-pay | Admitting: Obstetrics & Gynecology

## 2020-08-02 DIAGNOSIS — O24410 Gestational diabetes mellitus in pregnancy, diet controlled: Secondary | ICD-10-CM

## 2020-08-03 ENCOUNTER — Other Ambulatory Visit: Payer: 59

## 2020-08-03 ENCOUNTER — Ambulatory Visit (INDEPENDENT_AMBULATORY_CARE_PROVIDER_SITE_OTHER): Payer: 59

## 2020-08-03 ENCOUNTER — Encounter: Payer: Self-pay | Admitting: Obstetrics & Gynecology

## 2020-08-03 ENCOUNTER — Ambulatory Visit (INDEPENDENT_AMBULATORY_CARE_PROVIDER_SITE_OTHER): Payer: 59 | Admitting: Obstetrics & Gynecology

## 2020-08-03 ENCOUNTER — Encounter: Payer: 59 | Admitting: Women's Health

## 2020-08-03 ENCOUNTER — Other Ambulatory Visit: Payer: Self-pay

## 2020-08-03 VITALS — BP 125/75 | HR 107 | Wt 270.0 lb

## 2020-08-03 DIAGNOSIS — O285 Abnormal chromosomal and genetic finding on antenatal screening of mother: Secondary | ICD-10-CM

## 2020-08-03 DIAGNOSIS — O0993 Supervision of high risk pregnancy, unspecified, third trimester: Secondary | ICD-10-CM

## 2020-08-03 DIAGNOSIS — O99213 Obesity complicating pregnancy, third trimester: Secondary | ICD-10-CM

## 2020-08-03 DIAGNOSIS — O0990 Supervision of high risk pregnancy, unspecified, unspecified trimester: Secondary | ICD-10-CM | POA: Diagnosis not present

## 2020-08-03 DIAGNOSIS — O24410 Gestational diabetes mellitus in pregnancy, diet controlled: Secondary | ICD-10-CM | POA: Diagnosis not present

## 2020-08-03 NOTE — Progress Notes (Signed)
[place] 34+2 wks,breech,posterior placenta gr 3,cx 3 cm,AFI 17 cm,dermoid left ovary N/C,normal right ovary,fhr 120 bpm,EFW 2323 G 34%,limited view

## 2020-08-03 NOTE — Progress Notes (Signed)
   LOW-RISK PREGNANCY VISIT Patient name: Shannon Bentley MRN [id number]  Date of birth: 06/16/1990 Chief Complaint:   Routine Prenatal Visit ([place] today)  History of Present Illness:   Shannon Bentley is a 30 y.o. G1P0 female at 62w2dwith an Estimated Date of Delivery: 09/12/20 being seen today for ongoing management of a low-risk pregnancy.  [facility] 06/09/2020 03/16/2020 01/23/2020  Decreased Interest 0 0 0  Down, Depressed, Hopeless 0 0 0  PHQ - 2 Score 0 0 0  Altered sleeping 0 1 0  Tired, decreased energy 1 0 0  Change in appetite 0 0 0  Feeling bad or failure about yourself  0 0 0  Trouble concentrating 0 0 0  Moving slowly or fidgety/restless 0 3 0  Suicidal thoughts 0 0 0  PHQ-9 Score 1 4 0    Today she reports no complaints. Contractions: Not present. Vag. Bleeding: None.  Movement: Present. denies leaking of fluid. Review of Systems:   Pertinent items are noted in HPI Denies abnormal vaginal discharge w/ itching/odor/irritation, headaches, visual changes, shortness of breath, chest pain, abdominal pain, severe nausea/vomiting, or problems with urination or bowel movements unless otherwise stated above. Pertinent History Reviewed:  Reviewed past medical,surgical, social, obstetrical and family history.  Reviewed problem list, medications and allergies. Physical Assessment:   Vitals:   08/03/20 1634  BP: 125/75  Pulse: (!) 107  Weight: 270 lb (122.5 kg)  Body mass index is 51.02 kg/m.        Physical Examination:   General appearance: Well appearing, and in no distress  Mental status: Alert, oriented to person, place, and time  Skin: Warm & dry  Cardiovascular: Normal heart rate noted  Respiratory: Normal respiratory effort, no distress  Abdomen: Soft, gravid, nontender  Pelvic: Cervical exam deferred         Extremities: Edema: Trace  Fetal Status:     Movement: Present    Chaperone: n/a    No results found for this or any previous visit (from  the past 24 hour(s)).  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  Labs/procedures today: sonogram  Plan:  Continue routine obstetrical care  Next visit: prefers in person    Reviewed: Preterm labor symptoms and general obstetric precautions including but not limited to vaginal bleeding, contractions, leaking of fluid and fetal movement were reviewed in detail with the patient.  All questions were answered. Has home bp cuff. Rx faxed to . Check bp weekly, let usKoreanow if >140/90.   Follow-up: No follow-ups on file.  No orders of the defined types were placed in this encounter.   LuFlorian BuffMD 08/03/2020 4:53 PM

## 2020-08-06 ENCOUNTER — Other Ambulatory Visit: Payer: 59

## 2020-08-10 ENCOUNTER — Other Ambulatory Visit: Payer: 59

## 2020-08-10 ENCOUNTER — Encounter: Payer: 59 | Admitting: Women's Health

## 2020-08-10 ENCOUNTER — Encounter: Payer: 59 | Admitting: Obstetrics & Gynecology

## 2020-08-13 ENCOUNTER — Other Ambulatory Visit: Payer: 59

## 2020-08-16 ENCOUNTER — Encounter: Payer: Self-pay | Admitting: Obstetrics & Gynecology

## 2020-08-16 ENCOUNTER — Other Ambulatory Visit (HOSPITAL_COMMUNITY)
Admission: RE | Admit: 2020-08-16 | Discharge: 2020-08-16 | Disposition: A | Discharge status: Home / Self Care | Payer: 59 | Source: Ambulatory Visit | Attending: Obstetrics & Gynecology | Admitting: Obstetrics & Gynecology

## 2020-08-16 ENCOUNTER — Other Ambulatory Visit: Payer: Self-pay

## 2020-08-16 ENCOUNTER — Ambulatory Visit (INDEPENDENT_AMBULATORY_CARE_PROVIDER_SITE_OTHER): Payer: 59 | Admitting: Obstetrics & Gynecology

## 2020-08-16 VITALS — BP 118/78 | HR 114 | Wt 267.0 lb

## 2020-08-16 DIAGNOSIS — Z3A36 36 weeks gestation of pregnancy: Secondary | ICD-10-CM

## 2020-08-16 DIAGNOSIS — O0990 Supervision of high risk pregnancy, unspecified, unspecified trimester: Secondary | ICD-10-CM

## 2020-08-16 DIAGNOSIS — O24410 Gestational diabetes mellitus in pregnancy, diet controlled: Secondary | ICD-10-CM

## 2020-08-16 LAB — POCT URINALYSIS DIPSTICK OB
Blood, UA: NEGATIVE
Glucose, UA: NEGATIVE
Ketones, UA: NEGATIVE
Nitrite, UA: NEGATIVE
POC,PROTEIN,UA: NEGATIVE

## 2020-08-16 NOTE — Progress Notes (Signed)
HIGH-RISK PREGNANCY VISIT Patient name: Shannon Bentley MRN [id number]  Date of birth: 1991/02/05 Chief Complaint:   High Risk Gestation (GBS, GC/CHL)  History of Present Illness:   Shannon Bentley is a 30 y.o. G1P0 female at 56w1dwith an Estimated Date of Delivery: 09/12/20 being seen today for ongoing management of a high-risk pregnancy complicated by gestational diabetes under excellent dietary control.    Today she reports no complaints. Contractions: Irregular. Vag. Bleeding: None.  Movement: Present. denies leaking of fluid.   [facility] 06/09/2020 03/16/2020 01/23/2020  Decreased Interest 0 0 0  Down, Depressed, Hopeless 0 0 0  PHQ - 2 Score 0 0 0  Altered sleeping 0 1 0  Tired, decreased energy 1 0 0  Change in appetite 0 0 0  Feeling bad or failure about yourself  0 0 0  Trouble concentrating 0 0 0  Moving slowly or fidgety/restless 0 3 0  Suicidal thoughts 0 0 0  PHQ-9 Score 1 4 0     GAD 7 : Generalized Anxiety Score 06/09/2020 03/16/2020 01/23/2020  Nervous, Anxious, on Edge 0 0 0  Control/stop worrying 0 0 0  Worry too much - different things 0 0 0  Trouble relaxing 0 0 0  Restless 0 3 0  Easily annoyed or irritable 0 1 0  Afraid - awful might happen 0 0 0  Total GAD 7 Score 0 4 0     Review of Systems:   Pertinent items are noted in HPI Denies abnormal vaginal discharge w/ itching/odor/irritation, headaches, visual changes, shortness of breath, chest pain, abdominal pain, severe nausea/vomiting, or problems with urination or bowel movements unless otherwise stated above. Pertinent History Reviewed:  Reviewed past medical,surgical, social, obstetrical and family history.  Reviewed problem list, medications and allergies. Physical Assessment:   Vitals:   08/16/20 1552  BP: 118/78  Pulse: (!) 114  Weight: 267 lb (121.1 kg)  Body mass index is 50.45 kg/m.           Physical Examination:   General appearance: alert, well appearing, and in no  distress  Mental status: alert, oriented to person, place, and time  Skin: warm & dry   Extremities: Edema: Trace    Cardiovascular: normal heart rate noted  Respiratory: normal respiratory effort, no distress  Abdomen: gravid, soft, non-tender  Pelvic: Cervical exam performed         Fetal Status:     Movement: Present    Fetal Surveillance Testing today: FHR 147   Chaperone: JLevy Pupa   Results for orders placed or performed in visit on 08/16/20 (from the past 24 hour(s))  POC Urinalysis Dipstick OB   Collection Time: 08/16/20  3:54 PM  Result Value Ref Range   Color, UA     Clarity, UA     Glucose, UA Negative Negative   Bilirubin, UA     Ketones, UA neg    Spec Grav, UA     Blood, UA neg    pH, UA     POC,PROTEIN,UA Negative Negative, Trace, Small (1+), Moderate (2+), Large (3+), 4+   Urobilinogen, UA     Nitrite, UA neg    Leukocytes, UA Moderate (2+) (A) Negative   Appearance     Odor      Assessment & Plan:  High-risk pregnancy: G1P0 at 330w1dith an Estimated Date of Delivery: 09/12/20   1) A1DM, good control all CBG are good  2) [place]ree[place], [place] will confirm  after 37 weeks,   Meds: No orders of the defined types were placed in this encounter.   Labs/procedures today:  cultures done  Treatment Plan:  IOL 40 weeks if undelivered  Reviewed: Preterm labor symptoms and general obstetric precautions including but not limited to vaginal bleeding, contractions, leaking of fluid and fetal movement were reviewed in detail with the patient.  All questions were answered. Does have home bp cuff. Office bp cuff given: yes. Check bp weekly, let us know if consistently >140 and/or >90.  Follow-up: No follow-ups on file.   Future Appointments  Date Time Provider [place]  08/26/2020 10:15 AM [facility] NURSE [facility]  08/26/2020 10:30 AM [facility] US3 [facility]  08/31/2020  3:30 PM [place] - FTOBGYN [place] [facility] None  08/31/2020  4:10 PM Teron Blais, Mertie Clause, MD [facility]  FTOBGYN    Orders Placed This Encounter  Procedures   Strep Gp B NAA+Rflx   POC Urinalysis Dipstick OB   Mertie Clause Uzziel Russey 08/16/2020 4:30 PM

## 2020-08-17 ENCOUNTER — Encounter: Payer: 59 | Admitting: Women's Health

## 2020-08-17 ENCOUNTER — Other Ambulatory Visit: Payer: 59

## 2020-08-18 LAB — CERVICOVAGINAL ANCILLARY ONLY
Chlamydia: NEGATIVE
Comment: NEGATIVE
Comment: NORMAL
Neisseria Gonorrhea: NEGATIVE

## 2020-08-20 ENCOUNTER — Other Ambulatory Visit: Payer: 59

## 2020-08-23 ENCOUNTER — Other Ambulatory Visit: Payer: Self-pay | Admitting: Obstetrics & Gynecology

## 2020-08-24 ENCOUNTER — Other Ambulatory Visit: Payer: 59

## 2020-08-24 ENCOUNTER — Encounter: Payer: 59 | Admitting: Obstetrics & Gynecology

## 2020-08-26 ENCOUNTER — Ambulatory Visit (INDEPENDENT_AMBULATORY_CARE_PROVIDER_SITE_OTHER): Payer: 59 | Admitting: Women's Health

## 2020-08-26 ENCOUNTER — Ambulatory Visit: Payer: Medicaid Other | Admitting: *Deleted

## 2020-08-26 ENCOUNTER — Encounter: Payer: Self-pay | Admitting: Women's Health

## 2020-08-26 ENCOUNTER — Other Ambulatory Visit: Payer: Self-pay

## 2020-08-26 ENCOUNTER — Encounter: Payer: Self-pay | Admitting: *Deleted

## 2020-08-26 ENCOUNTER — Other Ambulatory Visit: Payer: Self-pay | Admitting: Obstetrics and Gynecology

## 2020-08-26 ENCOUNTER — Ambulatory Visit: Payer: Medicaid Other | Attending: Obstetrics and Gynecology

## 2020-08-26 VITALS — BP 121/74 | HR 116

## 2020-08-26 VITALS — BP 108/69 | HR 121 | Wt 270.6 lb

## 2020-08-26 DIAGNOSIS — O24410 Gestational diabetes mellitus in pregnancy, diet controlled: Secondary | ICD-10-CM

## 2020-08-26 DIAGNOSIS — O99613 Diseases of the digestive system complicating pregnancy, third trimester: Secondary | ICD-10-CM

## 2020-08-26 DIAGNOSIS — O289 Unspecified abnormal findings on antenatal screening of mother: Secondary | ICD-10-CM | POA: Diagnosis present

## 2020-08-26 DIAGNOSIS — O0990 Supervision of high risk pregnancy, unspecified, unspecified trimester: Secondary | ICD-10-CM

## 2020-08-26 DIAGNOSIS — O99213 Obesity complicating pregnancy, third trimester: Secondary | ICD-10-CM

## 2020-08-26 DIAGNOSIS — Z3A37 37 weeks gestation of pregnancy: Secondary | ICD-10-CM | POA: Diagnosis present

## 2020-08-26 DIAGNOSIS — K5090 Crohn's disease, unspecified, without complications: Secondary | ICD-10-CM

## 2020-08-26 DIAGNOSIS — O285 Abnormal chromosomal and genetic finding on antenatal screening of mother: Secondary | ICD-10-CM

## 2020-08-26 DIAGNOSIS — O0993 Supervision of high risk pregnancy, unspecified, third trimester: Secondary | ICD-10-CM

## 2020-08-26 NOTE — Progress Notes (Signed)
HIGH-RISK PREGNANCY VISIT Patient name: Shannon Bentley MRN [id number]  Date of birth: 1990/07/23 Chief Complaint:   Routine Prenatal Visit  History of Present Illness:   Ed Rayson is a 30 y.o. 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    Today she reports  sugars all wnl except for one 2hr pp 170s (had [employer] coffee, danish, and watermelon) . Contractions: Not present. Vag. Bleeding: None.  Movement: Present. denies leaking of fluid.   [facility] 06/09/2020 03/16/2020 01/23/2020  Decreased Interest 0 0 0  Down, Depressed, Hopeless 0 0 0  PHQ - 2 Score 0 0 0  Altered sleeping 0 1 0  Tired, decreased energy 1 0 0  Change in appetite 0 0 0  Feeling bad or failure about yourself  0 0 0  Trouble concentrating 0 0 0  Moving slowly or fidgety/restless 0 3 0  Suicidal thoughts 0 0 0  PHQ-9 Score 1 4 0     GAD 7 : Generalized Anxiety Score 06/09/2020 03/16/2020 01/23/2020  Nervous, Anxious, on Edge 0 0 0  Control/stop worrying 0 0 0  Worry too much - different things 0 0 0  Trouble relaxing 0 0 0  Restless 0 3 0  Easily annoyed or irritable 0 1 0  Afraid - awful might happen 0 0 0  Total GAD 7 Score 0 4 0     Review of Systems:   Pertinent items are noted in HPI Denies abnormal vaginal discharge w/ itching/odor/irritation, headaches, visual changes, shortness of breath, chest pain, abdominal pain, severe nausea/vomiting, or problems with urination or bowel movements unless otherwise stated above. Pertinent History Reviewed:  Reviewed past medical,surgical, social, obstetrical and family history.  Reviewed problem list, medications and allergies. Physical Assessment:   Vitals:   08/26/20 1540  BP: 108/69  Pulse: (!) 121  Weight: 270 lb 9.6 oz (122.7 kg)  Body mass index is  51.13 kg/m.           Physical Examination:   General appearance: alert, well appearing, and in no distress  Mental status: alert, oriented to person, place, and time  Skin: warm & dry   Extremities: Edema: Trace    Cardiovascular: normal heart rate noted  Respiratory: normal respiratory effort, no distress  Abdomen: gravid, soft, non-tender  Pelvic: Cervical exam deferred         Fetal Status: Fetal Heart Rate (bpm): 146 Fundal Height: 38 cm Movement: Present    Fetal Surveillance Testing today: U/S today at MFM Vtx, AFI 15.74cm, EFW 34% w/ AC 53%, Lt dermoid 2.5cm   Chaperone: N/A    No results found for this or any previous visit (from the past 24 hour(s)).  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   Labs/procedures today: none  Treatment Plan:   Deliver @ 39-40wks:____   Reviewed: Term labor symptoms and general obstetric precautions including but not limited to vaginal bleeding, contractions, leaking of fluid and fetal  movement were reviewed in detail with the patient.  All questions were answered. Does have home bp cuff. Office bp cuff given: not applicable. Check bp weekly, let us know if consistently >140 and/or >90.  Follow-up: Return for As scheduled for HROB (cancel 7/12 u/s).   Future Appointments  Date Time Provider [place]  08/31/2020  3:30 PM [facility] - FTOBGYN [place] [facility] None  08/31/2020  4:10 PM [place], Mertie Clause, MD C[facility]-FT FTOBGYN    No orders of the defined types were placed in this encounter.  [place], [facility] 08/26/2020 4:09 PM

## 2020-08-26 NOTE — Patient Instructions (Signed)
[place], thank you for choosing our office today! We appreciate the opportunity to meet your healthcare needs. You may receive a short survey by mail, e-mail, or through [employer]. If you are happy with your care we would appreciate if you could take just a few minutes to complete the survey questions. We read all of your comments and take your feedback very seriously. Thank you again for choosing our office.  Center for [employer] Team at [place] at [facility] ([place], [place] [address]) Entrance C, located off of [address] parking   CLASSES: Go to [email or web address] to register for classes (childbirth, breastfeeding, waterbirth, infant CPR, daddy bootcamp, etc.)  Call the office [phone]) or go to [facility] if: You begin to have strong, frequent contractions Your water breaks.  Sometimes it is a big gush of fluid, sometimes it is just a trickle that keeps getting your panties wet or running down your legs You have vaginal bleeding.  It is normal to have a small amount of spotting if your cervix was checked.  You don't feel your baby moving like normal.  If you don't, get you something to eat and drink and lay down and focus on feeling your baby move.   If your baby is still not moving like normal, you should call the office or go to [facility].  Call the office [phone]) or go to [facility] hospital for these signs of pre-eclampsia: Severe headache that does not go away with Tylenol Visual changes- seeing spots, double, blurred vision Pain under your right breast or upper abdomen that does not go away with Tums or heartburn medicine Nausea and/or vomiting Severe swelling in your hands, feet, and face   [facility] Pediatricians/Family Doctors [facility]): [address] [place], [place]: [address] [place], [place] [facility] [place], [phone] (call to ask if accepting patients) [facility] Department: [address] [place], [facility]): [address], [place] [facility]: [address] [place], [place] [facility]: [place], [facility]): [phone] [facility]: [address], [place]: [address] [place] [facility]: [phone], [phone]  Home Blood Pressure Monitoring for Patients   Your provider has recommended that you check your blood pressure (BP) at least once a week at home. If you do not have a blood pressure cuff at home, one will be provided for you. Contact your provider if you have not received your monitor within 1 week.   Helpful Tips for Accurate Home Blood Pressure Checks  Don't smoke, exercise, or drink caffeine 30 minutes before checking your BP Use the restroom before checking your BP (a full bladder can raise your pressure) Relax in a comfortable upright chair Feet on the ground Left arm resting comfortably on a flat surface at the level of your heart Legs uncrossed Back supported Sit quietly and don't talk Place the cuff on your bare arm Adjust snuggly, so that only two fingertips  can fit between your skin and the top of the cuff Check 2 readings separated by at least one minute Keep a log of your BP readings For a visual, please reference this diagram: http://ccnc.care/bpdiagram  Provider Name: [facility]     Phone: [phone]  Zone 1: ALL CLEAR  Continue to monitor your symptoms:  BP reading is less than 140 (top number) or less than 90 (bottom number)  No right  upper stomach pain No headaches or seeing spots No feeling nauseated or throwing up No swelling in face and hands  Zone 2: CAUTION Call your doctor's office for any of the following:  BP reading is greater than 140 (top number) or greater than 90 (bottom number)  Stomach pain under your ribs in the middle or right side Headaches or seeing spots Feeling nauseated or throwing up Swelling in face and hands  Zone 3: EMERGENCY  Seek immediate medical care if you have any of the following:  BP reading is greater than160 (top number) or greater than 110 (bottom number) Severe headaches not improving with Tylenol Serious difficulty catching your breath Any worsening symptoms from Zone 2   Braxton Hicks Contractions Contractions of the uterus can occur throughout pregnancy, but they are not always a sign that you are in labor. You may have practice contractions called Braxton Hicks contractions. These false labor contractions are sometimes confused with true labor. What are [place] contractions? Braxton Hicks contractions are tightening movements that occur in the muscles of the uterus before labor. Unlike true labor contractions, these contractions do not result in opening (dilation) and thinning of the cervix. Toward the end of pregnancy (32-34 weeks), Braxton Hicks contractions can happen more often and may become stronger. These contractions are sometimes difficult to tell apart from true labor because they can be very uncomfortable. You should not feel embarrassed if you go to the hospital with false labor. Sometimes, the only way to tell if you are in true labor is for your health care provider to look for changes in the cervix. The health care provider will do a physical exam and may monitor your contractions. If you are not in true labor, the exam should show that your cervix is not dilating and your water has not broken. If there are no other health problems associated with your  pregnancy, it is completely safe for you to be sent home with false labor. You may continue to have Braxton Hicks contractions until you go into true labor. How to tell the difference between true labor and false labor True labor Contractions last 30-70 seconds. Contractions become very regular. Discomfort is usually felt in the top of the uterus, and it spreads to the lower abdomen and low back. Contractions do not go away with walking. Contractions usually become more intense and increase in frequency. The cervix dilates and gets thinner. False labor Contractions are usually shorter and not as strong as true labor contractions. Contractions are usually irregular. Contractions are often felt in the front of the lower abdomen and in the groin. Contractions may go away when you walk around or change positions while lying down. Contractions get weaker and are shorter-lasting as time goes on. The cervix usually does not dilate or become thin. Follow these instructions at home:  Take over-the-counter and prescription medicines only as told by your health care provider. Keep up with your usual exercises and follow other instructions from your health care provider. Eat and drink lightly if you think  you are going into labor. If Braxton Hicks contractions are making you uncomfortable: Change your position from lying down or resting to walking, or change from walking to resting. Sit and rest in a tub of warm water. Drink enough fluid to keep your urine pale yellow. Dehydration may cause these contractions. Do slow and deep breathing several times an hour. Keep all follow-up prenatal visits as told by your health care provider. This is important. Contact a health care provider if: You have a fever. You have continuous pain in your abdomen. Get help right away if: Your contractions become stronger, more regular, and closer together. You have fluid leaking or gushing from your vagina. You pass  blood-tinged mucus (bloody show). You have bleeding from your vagina. You have low back pain that you never had before. You feel your baby's head pushing down and causing pelvic pressure. Your baby is not moving inside you as much as it used to. Summary Contractions that occur before labor are called Braxton Hicks contractions, false labor, or practice contractions. Braxton Hicks contractions are usually shorter, weaker, farther apart, and less regular than true labor contractions. True labor contractions usually become progressively stronger and regular, and they become more frequent. Manage discomfort from [facility] contractions by changing position, resting in a warm bath, drinking plenty of water, or practicing deep breathing. This information is not intended to replace advice given to you by your health care provider. Make sure you discuss any questions you have with your health care provider. Document Revised: 01/19/2017 Document Reviewed: 06/22/2016 Elsevier Patient Education  [place].

## 2020-08-27 ENCOUNTER — Other Ambulatory Visit: Payer: 59

## 2020-08-27 LAB — STREP GP B NAA+RFLX: Strep Gp B NAA+Rflx: POSITIVE — AB

## 2020-08-27 LAB — STREP GP B SUSCEPTIBILITY

## 2020-08-31 ENCOUNTER — Encounter: Payer: 59 | Admitting: Obstetrics & Gynecology

## 2020-08-31 ENCOUNTER — Other Ambulatory Visit: Payer: 59

## 2020-08-31 ENCOUNTER — Ambulatory Visit (INDEPENDENT_AMBULATORY_CARE_PROVIDER_SITE_OTHER): Payer: 59 | Admitting: Obstetrics & Gynecology

## 2020-08-31 ENCOUNTER — Encounter: Payer: Self-pay | Admitting: Obstetrics & Gynecology

## 2020-08-31 ENCOUNTER — Other Ambulatory Visit: Payer: Self-pay

## 2020-08-31 VITALS — BP 110/77 | HR 112 | Wt 272.0 lb

## 2020-08-31 DIAGNOSIS — Z3A38 38 weeks gestation of pregnancy: Secondary | ICD-10-CM

## 2020-08-31 DIAGNOSIS — O0990 Supervision of high risk pregnancy, unspecified, unspecified trimester: Secondary | ICD-10-CM

## 2020-08-31 LAB — POCT URINALYSIS DIPSTICK OB
Glucose, UA: NEGATIVE
Ketones, UA: NEGATIVE
Nitrite, UA: NEGATIVE
POC,PROTEIN,UA: NEGATIVE

## 2020-08-31 NOTE — Progress Notes (Signed)
HIGH-RISK PREGNANCY VISIT Patient name: Shannon Bentley MRN [id number]  Date of birth: Aug 02, 1990 Chief Complaint:   High Risk Gestation (+ pressure)  History of Present Illness:   Shannon Bentley is a 30 y.o. G1P0 female at 56w2dwith an Estimated Date of Delivery: 09/12/20 being seen today for ongoing management of a high-risk pregnancy complicated by diabetes mellitus A1DM.    Today she reports no complaints. Contractions: Irregular. Vag. Bleeding: None.  Movement: Present. denies leaking of fluid.   [facility] 06/09/2020 03/16/2020 01/23/2020  Decreased Interest 0 0 0  Down, Depressed, Hopeless 0 0 0  PHQ - 2 Score 0 0 0  Altered sleeping 0 1 0  Tired, decreased energy 1 0 0  Change in appetite 0 0 0  Feeling bad or failure about yourself  0 0 0  Trouble concentrating 0 0 0  Moving slowly or fidgety/restless 0 3 0  Suicidal thoughts 0 0 0  PHQ-9 Score 1 4 0     GAD 7 : Generalized Anxiety Score 06/09/2020 03/16/2020 01/23/2020  Nervous, Anxious, on Edge 0 0 0  Control/stop worrying 0 0 0  Worry too much - different things 0 0 0  Trouble relaxing 0 0 0  Restless 0 3 0  Easily annoyed or irritable 0 1 0  Afraid - awful might happen 0 0 0  Total GAD 7 Score 0 4 0     Review of Systems:   Pertinent items are noted in HPI Denies abnormal vaginal discharge w/ itching/odor/irritation, headaches, visual changes, shortness of breath, chest pain, abdominal pain, severe nausea/vomiting, or problems with urination or bowel movements unless otherwise stated above. Pertinent History Reviewed:  Reviewed past medical,surgical, social, obstetrical and family history.  Reviewed problem list, medications and allergies. Physical Assessment:   Vitals:   08/31/20 1624  BP: 110/77  Pulse: (!) 112  Weight: 272 lb (123.4 kg)  Body mass index is 51.39 kg/m.           Physical Examination:   General appearance: alert, well appearing, and in no distress  Mental status: alert,  oriented to person, place, and time  Skin: warm & dry   Extremities: Edema: Trace    Cardiovascular: normal heart rate noted  Respiratory: normal respiratory effort, no distress  Abdomen: gravid, soft, non-tender  Pelvic: Cervical exam deferred         Fetal Status:     Movement: Present    Fetal Surveillance Testing today:    Chaperone: JLevy Pupa   Results for orders placed or performed in visit on 08/31/20 (from the past 24 hour(s))  POC Urinalysis Dipstick OB   Collection Time: 08/31/20  4:26 PM  Result Value Ref Range   Color, UA     Clarity, UA     Glucose, UA Negative Negative   Bilirubin, UA     Ketones, UA neg    Spec Grav, UA     Blood, UA trace    pH, UA     POC,PROTEIN,UA Negative Negative, Trace, Small (1+), Moderate (2+), Large (3+), 4+   Urobilinogen, UA     Nitrite, UA neg    Leukocytes, UA Large (3+) (A) Negative   Appearance     Odor      Assessment & Plan:  High-risk pregnancy: G1P0 at 331w2dith an Estimated Date of Delivery: 09/12/20   1) A1DM, stable, good CBG   Meds: No orders of the defined types were placed in this  encounter.   Labs/procedures today: none  Treatment Plan:  IOL 40 weeks if undelivered  Reviewed: Term labor symptoms and general obstetric precautions including but not limited to vaginal bleeding, contractions, leaking of fluid and fetal movement were reviewed in detail with the patient.  All questions were answered. Does have home bp cuff. Office bp cuff given: not applicable. Check bp daily, let us know if consistently >140 and/or >90.  Follow-up: Return in about 1 week (around 09/07/2020) for [place].   No future appointments.  Orders Placed This Encounter  Procedures   POC Urinalysis Dipstick OB   Florian Buff  08/31/2020 4:53 PM

## 2020-09-03 ENCOUNTER — Other Ambulatory Visit: Payer: 59

## 2020-09-07 ENCOUNTER — Other Ambulatory Visit: Payer: Self-pay

## 2020-09-07 ENCOUNTER — Other Ambulatory Visit: Payer: 59

## 2020-09-07 ENCOUNTER — Ambulatory Visit (INDEPENDENT_AMBULATORY_CARE_PROVIDER_SITE_OTHER): Payer: 59 | Admitting: Obstetrics & Gynecology

## 2020-09-07 ENCOUNTER — Encounter: Payer: 59 | Admitting: Obstetrics & Gynecology

## 2020-09-07 ENCOUNTER — Encounter: Payer: Self-pay | Admitting: Obstetrics & Gynecology

## 2020-09-07 VITALS — BP 129/79 | HR 100 | Wt 272.0 lb

## 2020-09-07 DIAGNOSIS — Z3A39 39 weeks gestation of pregnancy: Secondary | ICD-10-CM

## 2020-09-07 DIAGNOSIS — O0990 Supervision of high risk pregnancy, unspecified, unspecified trimester: Secondary | ICD-10-CM

## 2020-09-07 DIAGNOSIS — O24410 Gestational diabetes mellitus in pregnancy, diet controlled: Secondary | ICD-10-CM

## 2020-09-07 NOTE — Progress Notes (Signed)
   HIGH-RISK PREGNANCY VISIT Patient name: Shannon Bentley MRN [id number]  Date of birth: 1990-07-11 Chief Complaint:   Routine Prenatal Visit  History of Present Illness:   Shannon Bentley is a 30 y.o. G1P0 female at 25w2dwith an Estimated Date of Delivery: 09/12/20 being seen today for ongoing management of a high-risk pregnancy complicated by diabetes mellitus A1/BDM.    Today she reports no complaints. Contractions: Irregular. Vag. Bleeding: None.  Movement: Present. denies leaking of fluid.   [facility] 06/09/2020 03/16/2020 01/23/2020  Decreased Interest 0 0 0  Down, Depressed, Hopeless 0 0 0  PHQ - 2 Score 0 0 0  Altered sleeping 0 1 0  Tired, decreased energy 1 0 0  Change in appetite 0 0 0  Feeling bad or failure about yourself  0 0 0  Trouble concentrating 0 0 0  Moving slowly or fidgety/restless 0 3 0  Suicidal thoughts 0 0 0  PHQ-9 Score 1 4 0     GAD 7 : Generalized Anxiety Score 06/09/2020 03/16/2020 01/23/2020  Nervous, Anxious, on Edge 0 0 0  Control/stop worrying 0 0 0  Worry too much - different things 0 0 0  Trouble relaxing 0 0 0  Restless 0 3 0  Easily annoyed or irritable 0 1 0  Afraid - awful might happen 0 0 0  Total GAD 7 Score 0 4 0     Review of Systems:   Pertinent items are noted in HPI Denies abnormal vaginal discharge w/ itching/odor/irritation, headaches, visual changes, shortness of breath, chest pain, abdominal pain, severe nausea/vomiting, or problems with urination or bowel movements unless otherwise stated above. Pertinent History Reviewed:  Reviewed past medical,surgical, social, obstetrical and family history.  Reviewed problem list, medications and allergies. Physical Assessment:   Vitals:   09/07/20 1049  BP: 129/79  Pulse: 100  Weight: 272 lb (123.4 kg)  Body mass index is 51.39 kg/m.           Physical Examination:   General appearance: alert, well appearing, and in no distress  Mental status: alert, oriented to  person, place, and time  Skin: warm & dry   Extremities: Edema: Trace    Cardiovascular: normal heart rate noted  Respiratory: normal respiratory effort, no distress  Abdomen: gravid, soft, non-tender  Pelvic: Cervical exam deferred         Fetal Status:     Movement: Present    Fetal Surveillance Testing today: FHR 144   Chaperone: N/A    No results found for this or any previous visit (from the past 24 hour(s)).  Assessment & Plan:  High-risk pregnancy: G1P0 at 373w2dith an Estimated Date of Delivery: 09/12/20   1) A1/B DM, stable, good CBG control    Meds: No orders of the defined types were placed in this encounter.   Labs/procedures today: none  Treatment Plan:  IOL 7/24 scheduled  Reviewed: Term labor symptoms and general obstetric precautions including but not limited to vaginal bleeding, contractions, leaking of fluid and fetal movement were reviewed in detail with the patient.  All questions were answered. Does have home bp cuff. Office bp cuff given: not applicable. Check bp daily, let usKoreanow if consistently >140 and/or >90.  Follow-up: No follow-ups on file.   No future appointments.  No orders of the defined types were placed in this encounter.  LuFlorian Buff/19/2022 11:23 AM

## 2020-09-09 ENCOUNTER — Other Ambulatory Visit: Payer: Self-pay | Admitting: Obstetrics & Gynecology

## 2020-09-09 DIAGNOSIS — O0990 Supervision of high risk pregnancy, unspecified, unspecified trimester: Secondary | ICD-10-CM

## 2020-09-09 NOTE — Treatment Plan (Signed)
   Induction Assessment Scheduling Form: Fax to [facility]:  [phone]  Gailya Tauer                                                                                   DOB:  Sep 02, 1990                                                            MRN:  [id number]                                                                     Phone #:                            Provider:  [facility]  GP:  G1P0                                                            Estimated Date of Delivery: 09/12/20  Dating Criteria:     Medical Indications for induction:  A1DM Admission Date/Time:  7/24 am Gestational age on admission:  [redacted]w[redacted]d  Filed Weights   09/07/20 1049  Weight: 272 lb (123.4 kg)   HIV:  Non Reactive (04/20 06578 GBS: --/Tessie Fass(06/28 1250)  Cervical exam was deferred   Method of induction(proposed):  choice   Scheduling Provider Signature:  LFlorian Buff MD                                            Today's Date:  09/09/2020

## 2020-09-10 ENCOUNTER — Other Ambulatory Visit: Payer: 59

## 2020-09-10 ENCOUNTER — Telehealth (HOSPITAL_COMMUNITY): Payer: Self-pay | Admitting: *Deleted

## 2020-09-10 NOTE — Telephone Encounter (Signed)
Preadmission screen  

## 2020-09-12 ENCOUNTER — Other Ambulatory Visit: Payer: Self-pay

## 2020-09-12 ENCOUNTER — Encounter (HOSPITAL_COMMUNITY): Payer: Self-pay | Admitting: Obstetrics & Gynecology

## 2020-09-12 ENCOUNTER — Inpatient Hospital Stay (HOSPITAL_COMMUNITY)
Admission: AD | Admit: 2020-09-12 | Discharge: 2020-09-15 | DRG: 807 | Disposition: A | Discharge status: Home / Self Care | Payer: 59 | Attending: Obstetrics & Gynecology | Admitting: Obstetrics & Gynecology

## 2020-09-12 ENCOUNTER — Inpatient Hospital Stay (HOSPITAL_COMMUNITY): Payer: 59 | Admitting: Anesthesiology

## 2020-09-12 ENCOUNTER — Inpatient Hospital Stay (HOSPITAL_COMMUNITY): Payer: 59

## 2020-09-12 DIAGNOSIS — O99824 Streptococcus B carrier state complicating childbirth: Secondary | ICD-10-CM | POA: Diagnosis present

## 2020-09-12 DIAGNOSIS — Z30017 Encounter for initial prescription of implantable subdermal contraceptive: Secondary | ICD-10-CM

## 2020-09-12 DIAGNOSIS — Z8632 Personal history of gestational diabetes: Secondary | ICD-10-CM | POA: Diagnosis present

## 2020-09-12 DIAGNOSIS — Z20822 Contact with and (suspected) exposure to covid-19: Secondary | ICD-10-CM | POA: Diagnosis present

## 2020-09-12 DIAGNOSIS — Z87891 Personal history of nicotine dependence: Secondary | ICD-10-CM | POA: Diagnosis not present

## 2020-09-12 DIAGNOSIS — Z3A40 40 weeks gestation of pregnancy: Secondary | ICD-10-CM | POA: Diagnosis not present

## 2020-09-12 DIAGNOSIS — O24420 Gestational diabetes mellitus in childbirth, diet controlled: Secondary | ICD-10-CM | POA: Diagnosis present

## 2020-09-12 DIAGNOSIS — O99214 Obesity complicating childbirth: Secondary | ICD-10-CM | POA: Diagnosis present

## 2020-09-12 DIAGNOSIS — O24410 Gestational diabetes mellitus in pregnancy, diet controlled: Secondary | ICD-10-CM | POA: Diagnosis present

## 2020-09-12 DIAGNOSIS — O0990 Supervision of high risk pregnancy, unspecified, unspecified trimester: Secondary | ICD-10-CM

## 2020-09-12 DIAGNOSIS — O99213 Obesity complicating pregnancy, third trimester: Secondary | ICD-10-CM

## 2020-09-12 LAB — CBC
HCT: 37.9 % (ref 36.0–46.0)
Hemoglobin: 11.8 g/dL — ABNORMAL LOW (ref 12.0–15.0)
MCH: 25.8 pg — ABNORMAL LOW (ref 26.0–34.0)
MCHC: 31.1 g/dL (ref 30.0–36.0)
MCV: 82.9 fL (ref 80.0–100.0)
Platelets: 303 10*3/uL (ref 150–400)
RBC: 4.57 MIL/uL (ref 3.87–5.11)
RDW: 15.9 % — ABNORMAL HIGH (ref 11.5–15.5)
WBC: 8.6 10*3/uL (ref 4.0–10.5)
nRBC: 0 % (ref 0.0–0.2)

## 2020-09-12 LAB — SARS CORONAVIRUS 2 (TAT 6-24 HRS): SARS Coronavirus 2: NEGATIVE

## 2020-09-12 LAB — GLUCOSE, CAPILLARY
Glucose-Capillary: 109 mg/dL — ABNORMAL HIGH (ref 70–99)
Glucose-Capillary: 97 mg/dL (ref 70–99)
Glucose-Capillary: 45 mg/dL — ABNORMAL LOW (ref 70–99)
Glucose-Capillary: 75 mg/dL (ref 70–99)
Glucose-Capillary: 123 mg/dL — ABNORMAL HIGH (ref 70–99)

## 2020-09-12 LAB — TYPE AND SCREEN
ABO/RH(D): O POS
Antibody Screen: NEGATIVE

## 2020-09-12 LAB — RPR: RPR Ser Ql: NONREACTIVE

## 2020-09-12 MED ORDER — LACTATED RINGERS IV SOLN: 500.0000 mL | Freq: Once | INTRAVENOUS | Status: DC

## 2020-09-12 MED ORDER — ONDANSETRON HCL 4 MG/2ML IJ SOLN
4.0000 mg | Freq: Four times a day (QID) | INTRAMUSCULAR | Status: DC | PRN
  Administered 2020-09-12: 4 mg via INTRAVENOUS
  Filled 2020-09-12: qty 2

## 2020-09-12 MED ORDER — FENTANYL-BUPIVACAINE-NACL 0.5-0.125-0.9 MG/250ML-% EP SOLN
EPIDURAL | Status: DC | PRN
  Administered 2020-09-12: 12 mL/h via EPIDURAL

## 2020-09-12 MED ORDER — FENTANYL CITRATE (PF) 100 MCG/2ML IJ SOLN
100.0000 ug | INTRAMUSCULAR | Status: DC | PRN
Start: 2020-09-12 — End: 2020-09-15
  Administered 2020-09-12 – 2020-09-13 (×3): 100 ug via INTRAVENOUS
  Filled 2020-09-12 (×3): qty 2

## 2020-09-12 MED ORDER — OXYCODONE-ACETAMINOPHEN 5-325 MG PO TABS: 2.0000 | ORAL_TABLET | ORAL | Status: DC | PRN

## 2020-09-12 MED ORDER — LIDOCAINE HCL (PF) 1 % IJ SOLN: 30.0000 mL | INTRAMUSCULAR | Status: DC | PRN

## 2020-09-12 MED ORDER — OXYTOCIN-SODIUM CHLORIDE 30-0.9 UT/500ML-% IV SOLN: 2.5000 [IU]/h | INTRAVENOUS | Status: DC

## 2020-09-12 MED ORDER — TERBUTALINE SULFATE 1 MG/ML IJ SOLN: 0.2500 mg | Freq: Once | INTRAMUSCULAR | Status: DC | PRN

## 2020-09-12 MED ORDER — LACTATED RINGERS IV SOLN
500.0000 mL | INTRAVENOUS | Status: DC | PRN
  Administered 2020-09-12 – 2020-09-13 (×2): 500 mL via INTRAVENOUS

## 2020-09-12 MED ORDER — OXYCODONE-ACETAMINOPHEN 5-325 MG PO TABS: 1.0000 | ORAL_TABLET | ORAL | Status: DC | PRN

## 2020-09-12 MED ORDER — LIDOCAINE HCL (PF) 1 % IJ SOLN
INTRAMUSCULAR | Status: DC | PRN
  Administered 2020-09-12: 10 mL via EPIDURAL
  Administered 2020-09-12: 2 mL via EPIDURAL

## 2020-09-12 MED ORDER — DIPHENHYDRAMINE HCL 50 MG/ML IJ SOLN
12.5000 mg | INTRAMUSCULAR | Status: DC | PRN
Start: 2020-09-12 — End: 2020-09-12

## 2020-09-12 MED ORDER — OXYTOCIN BOLUS FROM INFUSION: 333.0000 mL | Freq: Once | INTRAVENOUS | Status: DC

## 2020-09-12 MED ORDER — VANCOMYCIN HCL IN DEXTROSE 1-5 GM/200ML-% IV SOLN
1000.0000 mg | Freq: Two times a day (BID) | INTRAVENOUS | Status: DC
  Administered 2020-09-12 – 2020-09-13 (×2): 1000 mg via INTRAVENOUS
  Filled 2020-09-12 (×2): qty 200

## 2020-09-12 MED ORDER — SOD CITRATE-CITRIC ACID 500-334 MG/5ML PO SOLN: 30.0000 mL | ORAL | Status: DC | PRN

## 2020-09-12 MED ORDER — MISOPROSTOL 50MCG HALF TABLET
50.0000 ug | ORAL_TABLET | ORAL | Status: DC | PRN
  Administered 2020-09-12: 50 ug via BUCCAL
  Filled 2020-09-12: qty 1

## 2020-09-12 MED ORDER — LACTATED RINGERS IV SOLN: INTRAVENOUS | Status: DC

## 2020-09-12 MED ORDER — FENTANYL-BUPIVACAINE-NACL 0.5-0.125-0.9 MG/250ML-% EP SOLN
12.0000 mL/h | EPIDURAL | Status: DC | PRN
Start: 2020-09-12 — End: 2020-09-13
  Filled 2020-09-12: qty 250

## 2020-09-12 MED ORDER — MISOPROSTOL 25 MCG QUARTER TABLET
25.0000 ug | ORAL_TABLET | ORAL | Status: DC | PRN
  Filled 2020-09-12: qty 1

## 2020-09-12 MED ORDER — EPHEDRINE 5 MG/ML INJ: 10.0000 mg | INTRAVENOUS | Status: DC | PRN

## 2020-09-12 MED ORDER — OXYTOCIN-SODIUM CHLORIDE 30-0.9 UT/500ML-% IV SOLN
1.0000 m[IU]/min | INTRAVENOUS | Status: DC
  Administered 2020-09-12: 2 m[IU]/min via INTRAVENOUS
  Filled 2020-09-12: qty 500

## 2020-09-12 MED ORDER — ACETAMINOPHEN 325 MG PO TABS: 650.0000 mg | ORAL_TABLET | ORAL | Status: DC | PRN

## 2020-09-12 MED ORDER — PHENYLEPHRINE 40 MCG/ML (10ML) SYRINGE FOR IV PUSH (FOR BLOOD PRESSURE SUPPORT)
80.0000 ug | PREFILLED_SYRINGE | INTRAVENOUS | Status: DC | PRN
  Administered 2020-09-12: 80 ug via INTRAVENOUS

## 2020-09-12 MED ORDER — PHENYLEPHRINE 40 MCG/ML (10ML) SYRINGE FOR IV PUSH (FOR BLOOD PRESSURE SUPPORT)
80.0000 ug | PREFILLED_SYRINGE | INTRAVENOUS | Status: DC | PRN
  Administered 2020-09-12: 80 ug via INTRAVENOUS
  Filled 2020-09-12: qty 10

## 2020-09-12 NOTE — Progress Notes (Signed)
CNM at bedside to introduce self to patient and support person. Patient requesting cervical exam and cervix unchanged. Patient stating she is ready for an epidural now. Will discuss AROM after epidural placement.   Wende Mott, CNM 09/12/20 9:23 PM

## 2020-09-12 NOTE — Anesthesia Preprocedure Evaluation (Signed)
Anesthesia Evaluation  Patient identified by MRN, date of birth, ID band Patient awake    Reviewed: Allergy & Precautions, Patient's Chart, lab work & pertinent test results  Airway Mallampati: III  TM Distance: >3 FB Neck ROM: Full    Dental no notable dental hx.    Pulmonary sleep apnea , former smoker,  Quit smoking 2017   Pulmonary exam normal breath sounds clear to auscultation       Cardiovascular negative cardio ROS Normal cardiovascular exam Rhythm:Regular Rate:Normal     Neuro/Psych negative neurological ROS  negative psych ROS   GI/Hepatic Neg liver ROS, GERD  ,  Endo/Other  diabetes, GestationalMorbid obesitySuper morbid obesity BMI 52  Renal/GU negative Renal ROS  negative genitourinary   Musculoskeletal negative musculoskeletal ROS (+)   Abdominal (+) + obese,   Peds negative pediatric ROS (+)  Hematology negative hematology ROS (+) hct 37.9, plt 303   Anesthesia Other Findings   Reproductive/Obstetrics (+) Pregnancy                             Anesthesia Physical Anesthesia Plan  ASA: 4  Anesthesia Plan: Epidural   Post-op Pain Management:    Induction:   PONV Risk Score and Plan: 2  Airway Management Planned: Natural Airway  Additional Equipment: None  Intra-op Plan:   Post-operative Plan:   Informed Consent: I have reviewed the patients History and Physical, chart, labs and discussed the procedure including the risks, benefits and alternatives for the proposed anesthesia with the patient or authorized representative who has indicated his/her understanding and acceptance.       Plan Discussed with:   Anesthesia Plan Comments:         Anesthesia Quick Evaluation

## 2020-09-12 NOTE — H&P (Signed)
HPI: Shannon Bentley is a 30 y.o. year old G27P0 female at 45w0dweeks gestation who presents to L&D for IOL for A1/BDM, no meds.   Last growth [place]7/7/22: Est. 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  Flu vaccine  [place]/Hgb Elec neg  TDaP vaccine 4/20    Rhogam n/a Blood Type O/Positive/-- (01/14 1150)    Antibody Negative (01/14 1150)  Anatomy [place] female 'Shannon Bentley' HBsAg Negative (01/14 1150)  Feeding Plan Breast RPR Non Reactive (01/14 1150)  Contraception Nexplanon [place]  1.65 (01/14 1150)  Circumcision n/a HIV Non Reactive (01/14 1150)  Pediatrician [facility] Hep C neg  Prenatal Classes discussed      A1C/GTT Early: A1C 6.2, GTT 110/191/155     26-28wks:  BTL Consent     VBAC Consent n/a GBS        positive  Waterbirth [ ] Class [ ] Consent [ ] CNM visit     OB History     Gravida  1   Para      Term      Preterm      AB      Living         SAB      IAB      Ectopic      Multiple      Live Births             Past Medical History:  Diagnosis Date   Anemia    [place]    GERD (gastroesophageal reflux disease)    Sleep apnea    Past Surgical History:  Procedure Laterality Date   APPENDECTOMY     BIOPSY  07/03/2017   Procedure: BIOPSY;  Surgeon: FDanie Binder MD;  [facility];  Service: Endoscopy;;  ileum random colon   COLONOSCOPY  07/2015   Dr. BBritta Mccreedy evidence of prior surgical anastomosis, multiple biopsies. 2 small ulcers at anastomosis. TI and remaining colon normal, without evidence of IBD. Path with acute colitis with reactive changes, features of Crohn's not specifically identified. non-specific. differentials  including infection, drug effects, self-limited colitis   COLONOSCOPY WITH PROPOFOL N/A 07/03/2017   Procedure: COLONOSCOPY WITH PROPOFOL;  Surgeon: FDanie Binder MD;  Location: [facility];  Service: Endoscopy;  Laterality: N/A;  8:30am   EXPLORATORY LAPAROTOMY  05/2015   Dr. CLadona Horns large inflammatory mass at cecum, appendix unrecognizable in phlegmonous mass, right hemicolectomy performed with ileocolic anastamosis. path with crohn's disease involving both ileum and cecum   TONSILLECTOMY     tonsils and adenoids     Family History: family history includes Diabetes in her father and mother; Fibromyalgia in her sister; Heart attack in her maternal grandmother; Hypertension in her father and mother; Migraines in her sister; Other in her father. Social History:  reports that she quit smoking about 5 years ago. Her smoking use included e-cigarettes. She has a 0.25 pack-year smoking history. She has never used smokeless  tobacco. She reports previous alcohol use. She reports that she does not use drugs.     Maternal Diabetes: Yes:  Diabetes Type:  Diet controlled Genetic Screening: Abnormal:  Results: Other: Maternal Ultrasounds/Referrals: Normal Fetal Ultrasounds or other Referrals:  Referred to [facility] , Other:  Maternal Substance Abuse:  No Significant Maternal Medications:  None Significant Maternal Lab Results:  Group B Strep positive Other Comments:   Atypical sex chromosomes on NIPS. No amnio. FOB declined alpha Thal testing.    Review of Systems  Constitutional:  Negative for chills and fever.  Eyes:  Negative for visual disturbance.  Gastrointestinal:  Positive for abdominal pain (braxton Shannon Bentley). Negative for diarrhea and vomiting.  Genitourinary:  Negative for vaginal bleeding and vaginal discharge.  Neurological:  Negative for headaches.  Maternal Medical History:  Reason for admission: Induction of labor  Contractions: Frequency: rare.   Perceived severity  is mild.   Fetal activity: Perceived fetal activity is normal.   Prenatal complications: No bleeding, PIH, IUGR or pre-eclampsia.   Prenatal Complications - Diabetes: gestational. Diabetes is managed by diet.    Dilation: 1 Effacement (%): 50 Station: -2 Exam by:: Jasier Calabretta Blood pressure (!) 103/55, pulse 93, temperature 97.6 F (36.4 C), temperature source Oral, resp. rate 17, height 5' 1"  (1.549 m), weight 124.5 kg, last menstrual period 12/07/2019. Maternal Exam:  Uterine Assessment: Contraction strength is mild.  Contraction frequency is rare.  Abdomen: Patient reports no abdominal tenderness. Estimated fetal weight is 7-8.   Fetal presentation: vertex Introitus: Normal vulva. Vulva is negative for lesion.  Normal vagina.  Vagina is negative for discharge.  Pelvis: adequate for delivery.   Cervix: Cervix evaluated by digital exam.     Fetal Exam Fetal Monitor Review: Baseline rate: 135.  Variability: moderate (6-25 bpm).   Pattern: accelerations present and no decelerations.   Fetal State Assessment: Category I - tracings are normal.  Physical Exam Constitutional:      General: She is not in acute distress.    Appearance: She is obese. She is not ill-appearing or toxic-appearing.  HENT:     Head: Normocephalic.  Eyes:     General: No scleral icterus.    Conjunctiva/sclera: Conjunctivae normal.  Cardiovascular:     Rate and Rhythm: Normal rate.  Pulmonary:     Effort: Pulmonary effort is normal. No respiratory distress.  Abdominal:     Tenderness: There is no abdominal tenderness.  Genitourinary:    General: Normal vulva.  Vulva is no lesion.     Vagina: No vaginal discharge.  Musculoskeletal:     Right lower leg: Edema present.     Left lower leg: Edema present.  Skin:    General: Skin is warm and dry.  Neurological:     Mental Status: She is alert and oriented to person, place, and time.  Psychiatric:        Mood and Affect: Mood normal.    Prenatal  labs: ABO, Rh: --/--/PENDING (07/24 1011) Antibody: PENDING (07/24 1011) Rubella: 1.65 (01/14 1150) RPR: Non Reactive (04/20 0838)  HBsAg: Negative (01/14 1150)  HIV: Non Reactive (04/20 9983)  GBS: --/Positive (06/28 1250)   Assessment: 1. Labor: IOL 2. Fetal Wellbeing: Category I  3. Pain Control: Comfort measures.  4. GBS: Pos, -cillin allergy Hives 5. 40.0 week IUP 6. A1/BDM, no meds 7. Atypical sex chromosome findings on NIPS. Declined Amnio.  8. Hx laparotomy for hemicolecectomy  Plan:  1. Ad[facility] to [facility] per consult with MD  2. Routine L&D orders 3. Analgesia/anesthesia PRN  4. CBG Q4  5. Peds notified of NIPS. They will F/U.    Manya Silvas 09/12/2020, 10:54 AM

## 2020-09-12 NOTE — Progress Notes (Signed)
Labor Progress Note Shannon Bentley is a 30 y.o. G1P0 at 101w0dpresented for IOL for A1GDM, no meds S: Doing well, having some discomfort with contractions but does not feel she needs medications at this time  O:  BP 115/70   Pulse (!) 117   Temp 97.9 F (36.6 C) (Oral)   Resp 18   Ht 5' 1"  (1.549 m)   Wt 124.5 kg   LMP 12/07/2019   BMI 51.86 kg/m  EFM: Baseline 140/moderate variability/positive acels/ no decels Toco: not tracing  CVE: Dilation: 5 Effacement (%): 60 Cervical Position: Middle Station: -2 Presentation: Vertex Exam by:: JEpifanio Lesches RNC   A&P: 30y.o. G1P0 491w0dere for IOL for A1GDM #Labor: Progressing well. Titrating pitocin as per protocol, at 2 U #Pain: epidural as desired #FWB: Cat 1 #GBS positive, Vancomycin running #A1GDM: continue q4 glucose checks  AnWaldon MerlMD 3:59 PM

## 2020-09-12 NOTE — Progress Notes (Signed)
Ellsie Bentley is a 30 y.o. G1P0 at [redacted]w[redacted]d  Subjective: Fentanyl for pain. Coping adequately.   Objective: BP (!) 110/41   Pulse 96   Temp 97.9 F (36.6 C) (Oral)   Resp 18   Ht 5' 1"  (1.549 m)   Wt 124.5 kg   LMP 12/07/2019   BMI 51.86 kg/m    FHT:  FHR: 145 bpm, variability: min-mod,  accelerations:  10x10,  decelerations:  mild variables UC:   Q 2-5 minutes, mod-strong Dilation: 6.5 Effacement (%): 90 Cervical Position: Middle Station: -2 Presentation: Vertex Exam by:: A. Polos, RN  Labs: Results for orders placed or performed during the hospital encounter of 09/12/20 (from the past 24 hour(s))  CBC     Status: Abnormal   Collection Time: 09/12/20  7:53 AM  Result Value Ref Range   WBC 8.6 4.0 - 10.5 K/uL   RBC 4.57 3.87 - 5.11 MIL/uL   Hemoglobin 11.8 (L) 12.0 - 15.0 g/dL   HCT 37.9 36.0 - 46.0 %   MCV 82.9 80.0 - 100.0 fL   MCH 25.8 (L) 26.0 - 34.0 pg   MCHC 31.1 30.0 - 36.0 g/dL   RDW 15.9 (H) 11.5 - 15.5 %   Platelets 303 150 - 400 K/uL   nRBC 0.0 0.0 - 0.2 %  Glucose, capillary     Status: Abnormal   Collection Time: 09/12/20  8:32 AM  Result Value Ref Range   Glucose-Capillary 109 (H) 70 - 99 mg/dL  SARS CORONAVIRUS 2 (TAT 6-24 HRS) Nasopharyngeal Nasopharyngeal Swab     Status: None   Collection Time: 09/12/20  8:33 AM   Specimen: Nasopharyngeal Swab  Result Value Ref Range   SARS Coronavirus 2 NEGATIVE NEGATIVE  RPR     Status: None   Collection Time: 09/12/20 10:10 AM  Result Value Ref Range   RPR Ser Ql NON REACTIVE NON REACTIVE  Type and screen     Status: None   Collection Time: 09/12/20 10:11 AM  Result Value Ref Range   ABO/RH(D) O POS    Antibody Screen NEG    Sample Expiration      09/15/2020,2359 Performed at [facility] [address], [id number]  Glucose, capillary     Status: None   Collection Time: 09/12/20 12:20 PM  Result Value Ref Range   Glucose-Capillary 97 70 - 99 mg/dL  Glucose, capillary      Status: Abnormal   Collection Time: 09/12/20  4:34 PM  Result Value Ref Range   Glucose-Capillary 45 (L) 70 - 99 mg/dL  Glucose, capillary     Status: None   Collection Time: 09/12/20  4:35 PM  Result Value Ref Range   Glucose-Capillary 75 70 - 99 mg/dL    Assessment / Plan: [id number] IUP Labor: Activ. Titrate pitocin to achieve adequate labor.  Fetal Wellbeing:  Category I-II, overall reassuring Pain Control:  Fentanyl Anticipated MOD:  SVD  SmTamala JulianiVermontCND[place]/24/2022 7:56 PM

## 2020-09-12 NOTE — Anesthesia Procedure Notes (Signed)
Epidural Patient location during procedure: OB Start time: 09/12/2020 9:37 PM End time: 09/12/2020 9:48 PM  Staffing Anesthesiologist: Pervis Hocking, DO Performed: anesthesiologist   Preanesthetic Checklist Completed: patient identified, IV checked, risks and benefits discussed, monitors and equipment checked, pre-op evaluation and timeout performed  Epidural Patient position: sitting Prep: DuraPrep and site prepped and draped Patient monitoring: continuous pulse ox, blood pressure, heart rate and cardiac monitor Approach: midline Location: L3-L4 Injection technique: LOR air  Needle:  Needle type: Tuohy  Needle gauge: 17 G Needle length: 9 cm Needle insertion depth: 9 cm Catheter type: closed end flexible Catheter size: 19 Gauge Catheter at skin depth: 15 cm Test dose: negative  Assessment Sensory level: T8 Events: blood not aspirated, injection not painful, no injection resistance, no paresthesia and negative IV test  Additional Notes Patient identified. Risks/Benefits/Options discussed with patient including but not limited to bleeding, infection, nerve damage, paralysis, failed block, incomplete pain control, headache, blood pressure changes, nausea, vomiting, reactions to medication both or allergic, itching and postpartum back pain. Confirmed with bedside nurse the patient's most recent platelet count. Confirmed with patient that they are not currently taking any anticoagulation, have any bleeding history or any family history of bleeding disorders. Patient expressed understanding and wished to proceed. All questions were answered. Sterile technique was used throughout the entire procedure. Please see nursing notes for vital signs. Test dose was given through epidural catheter and negative prior to continuing to dose epidural or start infusion. Warning signs of high block given to the patient including shortness of breath, tingling/numbness in hands, complete motor  block, or any concerning symptoms with instructions to call for help. Patient was given instructions on fall risk and not to get out of bed. All questions and concerns addressed with instructions to call with any issues or inadequate analgesia.  Reason for block:procedure for pain

## 2020-09-13 ENCOUNTER — Encounter (HOSPITAL_COMMUNITY): Payer: Self-pay | Admitting: Obstetrics & Gynecology

## 2020-09-13 DIAGNOSIS — O99824 Streptococcus B carrier state complicating childbirth: Secondary | ICD-10-CM

## 2020-09-13 DIAGNOSIS — O24420 Gestational diabetes mellitus in childbirth, diet controlled: Secondary | ICD-10-CM | POA: Diagnosis not present

## 2020-09-13 DIAGNOSIS — Z3A40 40 weeks gestation of pregnancy: Secondary | ICD-10-CM

## 2020-09-13 LAB — CBC
HCT: 31.6 % — ABNORMAL LOW (ref 36.0–46.0)
Hemoglobin: 10.2 g/dL — ABNORMAL LOW (ref 12.0–15.0)
MCH: 26.2 pg (ref 26.0–34.0)
MCHC: 32.3 g/dL (ref 30.0–36.0)
MCV: 81 fL (ref 80.0–100.0)
Platelets: 282 10*3/uL (ref 150–400)
RBC: 3.9 MIL/uL (ref 3.87–5.11)
RDW: 15.8 % — ABNORMAL HIGH (ref 11.5–15.5)
WBC: 14.1 10*3/uL — ABNORMAL HIGH (ref 4.0–10.5)
nRBC: 0 % (ref 0.0–0.2)

## 2020-09-13 LAB — GLUCOSE, CAPILLARY
Glucose-Capillary: 123 mg/dL — ABNORMAL HIGH (ref 70–99)
Glucose-Capillary: 150 mg/dL — ABNORMAL HIGH (ref 70–99)

## 2020-09-13 MED ORDER — BENZOCAINE-MENTHOL 20-0.5 % EX AERO
1.0000 "application " | INHALATION_SPRAY | CUTANEOUS | Status: DC | PRN
  Administered 2020-09-13 – 2020-09-15 (×2): 1 via TOPICAL
  Filled 2020-09-13 (×2): qty 56

## 2020-09-13 MED ORDER — ONDANSETRON HCL 4 MG/2ML IJ SOLN: 4.0000 mg | INTRAMUSCULAR | Status: DC | PRN

## 2020-09-13 MED ORDER — TRANEXAMIC ACID-NACL 1000-0.7 MG/100ML-% IV SOLN
INTRAVENOUS | Status: AC
  Administered 2020-09-13: 1000 mg
  Filled 2020-09-13: qty 100

## 2020-09-13 MED ORDER — CALCIUM CARBONATE ANTACID 500 MG PO CHEW
400.0000 mg | CHEWABLE_TABLET | Freq: Two times a day (BID) | ORAL | Status: DC | PRN
  Administered 2020-09-13 – 2020-09-14 (×3): 400 mg via ORAL
  Filled 2020-09-13 (×4): qty 2

## 2020-09-13 MED ORDER — SENNOSIDES-DOCUSATE SODIUM 8.6-50 MG PO TABS
2.0000 | ORAL_TABLET | Freq: Every day | ORAL | Status: DC
  Administered 2020-09-14 – 2020-09-15 (×2): 2 via ORAL
  Filled 2020-09-13 (×2): qty 2

## 2020-09-13 MED ORDER — ZOLPIDEM TARTRATE 5 MG PO TABS: 5.0000 mg | ORAL_TABLET | Freq: Every evening | ORAL | Status: DC | PRN

## 2020-09-13 MED ORDER — DIBUCAINE (PERIANAL) 1 % EX OINT: 1.0000 "application " | TOPICAL_OINTMENT | CUTANEOUS | Status: DC | PRN

## 2020-09-13 MED ORDER — WITCH HAZEL-GLYCERIN EX PADS: 1.0000 "application " | MEDICATED_PAD | CUTANEOUS | Status: DC | PRN

## 2020-09-13 MED ORDER — DIPHENHYDRAMINE HCL 25 MG PO CAPS: 25.0000 mg | ORAL_CAPSULE | Freq: Four times a day (QID) | ORAL | Status: DC | PRN

## 2020-09-13 MED ORDER — COCONUT OIL OIL: 1.0000 "application " | TOPICAL_OIL | Status: DC | PRN

## 2020-09-13 MED ORDER — ONDANSETRON HCL 4 MG PO TABS: 4.0000 mg | ORAL_TABLET | ORAL | Status: DC | PRN

## 2020-09-13 MED ORDER — ACETAMINOPHEN 325 MG PO TABS: 650.0000 mg | ORAL_TABLET | ORAL | Status: DC | PRN

## 2020-09-13 MED ORDER — SIMETHICONE 80 MG PO CHEW: 80.0000 mg | CHEWABLE_TABLET | ORAL | Status: DC | PRN

## 2020-09-13 MED ORDER — IBUPROFEN 600 MG PO TABS
600.0000 mg | ORAL_TABLET | Freq: Four times a day (QID) | ORAL | Status: DC
  Administered 2020-09-13 – 2020-09-15 (×8): 600 mg via ORAL
  Filled 2020-09-13 (×8): qty 1

## 2020-09-13 MED ORDER — PRENATAL MULTIVITAMIN CH
1.0000 | ORAL_TABLET | Freq: Every day | ORAL | Status: DC
Start: 2020-09-13 — End: 2020-09-15
  Administered 2020-09-13 – 2020-09-14 (×2): 1 via ORAL
  Filled 2020-09-13 (×2): qty 1

## 2020-09-13 MED ORDER — TETANUS-DIPHTH-ACELL PERTUSSIS 5-2.5-18.5 LF-MCG/0.5 IM SUSY
0.5000 mL | PREFILLED_SYRINGE | Freq: Once | INTRAMUSCULAR | Status: DC
Start: 2020-09-14 — End: 2020-09-15

## 2020-09-13 NOTE — Lactation Note (Signed)
This note was copied from a baby's chart. Lactation Consultation Note  Patient Name: Shannon Bentley DGLOV'F Date: 09/13/2020 Reason for consult: Initial assessment;Primapara;1st time breastfeeding;Term Age:30 hours   P1 mother whose infant is now 14 hours old.  This is a term baby at 40+1 weeks.    RN called to inform me that baby's most recent blood sugar was 26 mg/dl.  Offered to assist with latching, however, mother stated baby had just finished breast feeding for 15 minutes.  Mother willing to give donor breast milk as a supplement to help stabilize blood sugars.  Consent signed.  Demonstrated paced bottle feeding.  Mother prefers to use her own bottle from home.  RN had given me the gold Nfant nipple to use.  Baby consumed 10 mls in 4 minutes and burped well.  Suggest using the purple extra slow flow with the next feeding.  Swaddled baby per mother's request due to mother being extremely sleepy.  Asked her to do STS when she is awake to help with blood sugars.  Mother verbalized understanding.  Day shift RN in room to complete morning assessment and speak with mother.  Mom made aware of O/P services, breastfeeding support groups, community resources, and our phone # for post-discharge questions.  Grandmother present.  Mother has a DEBP for home use.   Maternal Data Has patient been taught Hand Expression?: Yes Does the patient have breastfeeding experience prior to this delivery?: No  Feeding Mother's Current Feeding Choice: Breast Milk Nipple Type: Nfant Extra Slow Flow (gold) (Suggest using purple extra slow flow with the next feeding)  LATCH Score Latch: Repeated attempts needed to sustain latch, nipple held in mouth throughout feeding, stimulation needed to elicit sucking reflex.  Audible Swallowing: None  Type of Nipple: Everted at rest and after stimulation  Comfort (Breast/Nipple): Soft / non-tender  Hold (Positioning): Assistance needed to correctly position infant  at breast and maintain latch.  LATCH Score: 6   Lactation Tools Discussed/Used    Interventions    Discharge Pump: Personal  Consult Status Consult Status: Follow-up Date: 09/14/20 Follow-up type: In-patient    Shannon Bentley 09/13/2020, 7:58 AM

## 2020-09-13 NOTE — Lactation Note (Signed)
This note was copied from a baby's chart. Lactation Consultation Note  Patient Name: Shannon Bentley [id number] Date: 09/13/2020 Reason for consult: L&D Initial assessment;1st time breastfeeding;Primapara;Term Age:30 hours   Initial L&D Consult:  Visited with family < 1 hour after birth 69 STS on mother's chest when I arrived.  Attempted to latch to the right breast without success.  Hand expression demonstrated but no colostrum drops noted.  Switched to the right breast and baby able to latch and suck a few times with stimulation.  She remained sleepy; placed her back STS on mother's chest.  Reassured mother that lactation assistance will be available on the M/B unit.  Allowed time for family bonding.  Grandmother present.   Maternal Data    Feeding Mother's Current Feeding Choice: Breast Milk  LATCH Score Latch: Repeated attempts needed to sustain latch, nipple held in mouth throughout feeding, stimulation needed to elicit sucking reflex.  Audible Swallowing: None  Type of Nipple: Everted at rest and after stimulation  Comfort (Breast/Nipple): Soft / non-tender  Hold (Positioning): Assistance needed to correctly position infant at breast and maintain latch.  LATCH Score: 6   Lactation Tools Discussed/Used    Interventions    Discharge    Consult Status Consult Status: Follow-up Date: 09/13/20 Follow-up type: In-patient    Shannon Bentley R [name] 09/13/2020, 4:32 AM

## 2020-09-13 NOTE — Discharge Summary (Addendum)
Postpartum Discharge Summary     Patient Name: Shannon Bentley DOB: 1990-05-22 MRN: [id number]  Date of admission: 09/12/2020 Delivery date:09/13/2020  Delivering provider: Wende Mott  Date of discharge: 09/15/2020  Admitting diagnosis: Gestational diabetes mellitus, class A1 [O24.410] Intrauterine pregnancy: [redacted]w[redacted]d    Secondary diagnosis:  Active Problems:   Gestational diabetes mellitus, class A1  Additional problems: NA    Discharge diagnosis: Term Pregnancy Delivered and GDM A1                                              Post partum procedures: Nexplanon Augmentation: AROM and Pitocin Complications: None  Hospital course: Induction of Labor With Vaginal Delivery   30y.o. yo G1P0 at 447w1das admitted to the hospital 09/12/2020 for induction of labor.  Indication for induction: A1 DM.  Patient had an uncomplicated labor course as follows: Membrane Rupture Time/Date: 11:29 PM ,09/12/2020   Delivery Method:Vaginal, Spontaneous  Episiotomy: None  Lacerations:  1st degree  Details of delivery can be found in separate delivery note.  Patient had a routine postpartum course. Patient is discharged home 09/15/20.  Newborn Data: Birth date:09/13/2020  Birth time:3:32 AM  Gender:Female  Living status:Living  Apgars:6 ,8  Weight:3110 g   Magnesium Sulfate received: No BMZ received: No Rhophylac:N/A MMR:No T-DaP:Given prenatally Flu: No Transfusion:No  Physical exam  Vitals:   09/14/20 0530 09/14/20 1541 09/14/20 2147 09/15/20 0504  BP: 108/64 116/75 114/64 119/76  Pulse: 90 100 99 95  Resp: 16 19 18 18   Temp: 98 F (36.7 C) 98.4 F (36.9 C) 98.3 F (36.8 C) 98.1 F (36.7 C)  TempSrc: Oral Oral Oral Oral  SpO2: 100% 100% 100% 100%  Weight:      Height:       General: alert, cooperative, and no distress Lochia: appropriate Uterine Fundus: firm Incision: N/A DVT Evaluation: No evidence of DVT seen on physical exam. Labs: Lab Results  Component Value  Date   WBC 14.1 (H) 09/13/2020   HGB 10.2 (L) 09/13/2020   HCT 31.6 (L) 09/13/2020   MCV 81.0 09/13/2020   PLT 282 09/13/2020   CMP Latest Ref Rng & Units 07/06/2018  Glucose 70 - 99 mg/dL 127(H)  BUN 6 - 20 mg/dL 12  Creatinine 0.44 - 1.00 mg/dL 0.78  Sodium 135 - 145 mmol/L 141  Potassium 3.5 - 5.1 mmol/L 4.2  Chloride 98 - 111 mmol/L 103  CO2 22 - 32 mmol/L 28  Calcium 8.9 - 10.3 mg/dL 9.5  Total Protein 6.5 - 8.1 g/dL 7.5  Total Bilirubin 0.3 - 1.2 mg/dL 0.5  Alkaline Phos 38 - 126 U/L 68  AST 15 - 41 U/L 22  ALT 0 - 44 U/L 22   Edinburgh Score: No flowsheet data found.   After visit meds:  Allergies as of 09/15/2020       Reactions   Amoxicillin-pot Clavulanate Hives   Bactrim [sulfamethoxazole-trimethoprim] Hives   Benadryl [diphenhydramine] Hives   Dilaudid [hydromorphone Hcl] Hives        Medication List     STOP taking these medications    Accu-Chek Guide Me w/Device Kit   Accu-Chek Guide test strip Generic drug: glucose blood   Accu-Chek Softclix Lancets lancets   aspirin 81 MG EC tablet   clobetasol cream 0.05 % Commonly known as: TEMOVATE   Comfort  Fit Maternity Supp Lg Misc   ELDERBERRY PO   fluticasone 50 MCG/ACT nasal spray Commonly known as: FLONASE   folic acid 953 MCG tablet Commonly known as: FOLVITE   IRON PO   PRENATAL VITAMIN PO   VITA-C PO   Vitamin D3 125 MCG (5000 UT) Caps       TAKE these medications    calcium carbonate 500 MG chewable tablet Commonly known as: Tums Chew 1 tablet (200 mg of elemental calcium total) by mouth 3 (three) times daily.   ibuprofen 600 MG tablet Commonly known as: ADVIL Take 1 tablet (600 mg total) by mouth every 6 (six) hours.         Discharge home in stable condition Infant Feeding: Breast Infant Disposition:home with mother Discharge instruction: per After Visit Summary and Postpartum booklet. Activity: Advance as tolerated. Pelvic rest for 6 weeks.  Diet: carb  modified diet Future Appointments: Future Appointments  Date Time Provider [place]  10/19/2020  2:30 PM Roma Schanz, CNM [facility]   Follow up Visit:  Follow-up [facility]. Schedule an appointment as soon as possible for a visit in 4 day(s).   Contact information: [address] [place] [address] [phone]                 Please schedule this patient for a In person postpartum visit in 4 weeks with the following provider: Any provider. Additional Postpartum F/U:2 hour GTT  High risk pregnancy complicated by: GDM Delivery mode:  Vaginal, Spontaneous  Anticipated Birth Control:  Unsure   09/15/2020 Hansel Feinstein, CNM

## 2020-09-13 NOTE — Progress Notes (Signed)
Shannon Bentley notified of 150 glucose. Pt had 2 cups of cranberry juice prior to glucose. Request clarification of glucose frequency.

## 2020-09-13 NOTE — Anesthesia Postprocedure Evaluation (Signed)
Anesthesia Post Note  Patient: Shannon Bentley  Procedure(s) Performed: AN AD [place]     Patient location during evaluation: Mother Baby Anesthesia Type: Epidural Level of consciousness: awake Pain management: satisfactory to patient Vital Signs Assessment: post-procedure vital signs reviewed and stable Respiratory status: spontaneous breathing Cardiovascular status: stable Anesthetic complications: no   No notable events documented.  Last Vitals:  Vitals:   09/13/20 1225 09/13/20 1602  BP: (!) 106/58 120/60  Pulse: 100 98  Resp: 18 16  Temp: 36.8 C 36.6 C  SpO2: 100% 99%    Last Pain:  Vitals:   09/13/20 1602  TempSrc: Oral  PainSc: 0-No pain   Pain Goal:                   [employer]

## 2020-09-14 DIAGNOSIS — Z30017 Encounter for initial prescription of implantable subdermal contraceptive: Secondary | ICD-10-CM

## 2020-09-14 LAB — GLUCOSE, CAPILLARY: Glucose-Capillary: 93 mg/dL (ref 70–99)

## 2020-09-14 MED ORDER — LIDOCAINE HCL 1 % IJ SOLN
0.0000 mL | Freq: Once | INTRAMUSCULAR | Status: AC | PRN
  Administered 2020-09-14: 20 mL via INTRADERMAL

## 2020-09-14 MED ORDER — ETONOGESTREL 68 MG ~~LOC~~ IMPL
68.0000 mg | DRUG_IMPLANT | Freq: Once | SUBCUTANEOUS | Status: AC
  Administered 2020-09-14: 68 mg via SUBCUTANEOUS
  Filled 2020-09-14: qty 1

## 2020-09-14 MED ORDER — LIDOCAINE HCL 1 % IJ SOLN
INTRAMUSCULAR | Status: AC
  Filled 2020-09-14: qty 20

## 2020-09-14 NOTE — Progress Notes (Addendum)
POSTPARTUM PROGRESS NOTE  Subjective: Shannon Bentley is a 30 y.o. G1P1001 PPD#1 SVD.  She reports she doing well. No acute events overnight. She denies any problems with ambulating, voiding or po intake. Denies nausea or vomiting. She has passed flatus. Pain is well controlled, pt reports that she is just sore.  Lochia is appropriate.  Objective: Blood pressure 108/64, pulse 90, temperature 98 F (36.7 C), temperature source Oral, resp. rate 16, height 5' 1"  (1.549 m), weight 124.5 kg, last menstrual period 12/07/2019, SpO2 100 %, unknown if currently breastfeeding.  Physical Exam:  General: alert, cooperative and no distress Chest: no respiratory distress Abdomen: soft, non-tender  Uterine Fundus: firm and at level of umbilicus Extremities: No calf swelling or tenderness  no edema  Recent Labs    09/12/20 0753 09/13/20 0736  HGB 11.8* 10.2*  HCT 37.9 31.6*    Assessment/Plan: Shannon Bentley is a 30 y.o. G1P1001 PPD#1 SVD.   Routine Postpartum Care: Doing well, pain well-controlled.  -- Continue routine care, lactation support  -- Contraception: nexplanon IP -- Feeding: breast -- A1GDM: CBG-93 -- Hgb 10.2-consider home po iron, pt asymptomatic  Dispo: Plan for discharge tomorrow.  Erskine Emery, [place] [facility] [place] 09/14/2020 9:55 AM  GME ATTESTATION:  I saw and evaluated the patient. I agree with the findings and the plan of care as documented in the resident's note. Nexplanon placed, see separate note.   Darrelyn Hillock, DO OB Fellow, [place] for [place] 09/14/2020 10:20 AM

## 2020-09-14 NOTE — Lactation Note (Signed)
This note was copied from a baby's chart. Lactation Consultation Note  Patient Name: Shannon Bentley Date: 09/14/2020 Reason for consult: Follow-up assessment;1st time breastfeeding;Primapara;Term Age:30 hours   P1 mother whose infant is now 13 hours old.  This is a term baby at 40+1 weeks.  Initially, baby had hypoglycemia: blood sugars yesterday morning of 27 mg/dl, 26 mg/dl and 30 mg/dl.  Supplementation began and the follow up blood sugars were 53 mg/dl and 52 mg/dl.  No further glucose checks required.  Baby is currently under double phototherapy using a bili blanket and an overhead bank light.  Tcb level was 8.0 at 19 hours of life (high risk zone).  Mother and grandmother instructed on the importance of keeping baby under the phototherapy as much as possible to help decrease jaundice levels.  When feeding, grandmother has been taking her off the bili blanket.  Showed grandmother how she is able to feed baby while on the bili blanket.  Family is currently using their own person Dr. Saul Fordyce bottle and baby is consuming adequate amounts without difficulty.  Encouraged to feed 30+ mls as baby desires with frequent burping.  Assisted grandmother to burp baby.  Offered to initiate the DEBP for mother.  She has her own personal pump at bedside, however, explained the benefits of using the hospital grade pump and mother willing to do so.  Pump parts, assembly and cleaning reviewed.  Mother has not been able to express colostrum at this time, however, encouraged lots of hand expression before/after pumping.  Mother will pump every three hours.    At this time, baby has not been going to the breast.  When mother is ready to attempt breast feeding again she may call for latch assistance.  Reviewed feeding plan for the day.  RN in room during my visit and updated.   Maternal Data Has patient been taught Hand Expression?: Yes Does the patient have breastfeeding experience prior to this  delivery?: No  Feeding Mother's Current Feeding Choice: Breast Milk and Formula Nipple Type: Other  LATCH Score                    Lactation Tools Discussed/Used    Interventions    Discharge Pump: DEBP;Manual;Personal  Consult Status Consult Status: Follow-up Date: 09/15/20 Follow-up type: In-patient    Shannon Bentley 09/14/2020, 12:31 PM

## 2020-09-14 NOTE — Procedures (Signed)
  Procedure Note: Nexplanon insertion  Patient is a 30 y.o. G1P1001 now PPD# 1 from VD. She desires long-term reversible contraception.  Risks/benefits/side effects of Nexplanon have been discussed with patient and her questions have been answered. Patient is aware of the common side effect of irregular bleeding, which the incidence of decreases over time.  BP 108/64 (BP Location: Left Arm)   Pulse 90   Temp 98 F (36.7 C) (Oral)   Resp 16   Ht 5' 1"  (1.549 m)   Wt 124.5 kg   LMP 12/07/2019   SpO2 100%   Breastfeeding Unknown   BMI 51.86 kg/m   Results for orders placed or performed during the hospital encounter of 09/12/20 (from the past 24 hour(s))  Glucose, capillary   Collection Time: 09/14/20  5:52 AM  Result Value Ref Range   Glucose-Capillary 93 70 - 99 mg/dL     She is right-handed, so her left arm, approximately 10 cm proximal from the elbow, was cleansed with alcohol and anesthetized with 2 mL of 1% Lidocaine with Epi.  The area was cleansed again with betadine and the Nexplanon was inserted per manufacturer's recommendations without difficulty.  A pressure bandage were applied.  Pt was instructed to keep the area clean and dry, remove pressure bandage in 24 hours.  She was given a card indicating date Nexplanon was inserted and date it needs to be removed. Follow-up PRN problems.  Darrelyn Hillock, DO  OB Fellow  09/14/2020, 10:21 AM

## 2020-09-15 MED ORDER — IBUPROFEN 600 MG PO TABS: 600.0000 mg | ORAL_TABLET | Freq: Four times a day (QID) | ORAL | 0 refills | Status: DC

## 2020-09-17 ENCOUNTER — Emergency Department (HOSPITAL_COMMUNITY)
Admission: EM | Admit: 2020-09-17 | Discharge: 2020-09-17 | Discharge status: Against Medical Advice | Payer: Medicaid Other | Attending: Emergency Medicine | Admitting: Emergency Medicine

## 2020-09-17 ENCOUNTER — Emergency Department (HOSPITAL_COMMUNITY): Payer: Medicaid Other

## 2020-09-17 ENCOUNTER — Encounter (HOSPITAL_COMMUNITY): Payer: Self-pay | Admitting: Emergency Medicine

## 2020-09-17 ENCOUNTER — Other Ambulatory Visit: Payer: Self-pay

## 2020-09-17 DIAGNOSIS — W182XXA Fall in (into) shower or empty bathtub, initial encounter: Secondary | ICD-10-CM | POA: Diagnosis not present

## 2020-09-17 DIAGNOSIS — N939 Abnormal uterine and vaginal bleeding, unspecified: Secondary | ICD-10-CM | POA: Insufficient documentation

## 2020-09-17 DIAGNOSIS — W19XXXA Unspecified fall, initial encounter: Secondary | ICD-10-CM

## 2020-09-17 DIAGNOSIS — S3992XA Unspecified injury of lower back, initial encounter: Secondary | ICD-10-CM | POA: Diagnosis present

## 2020-09-17 DIAGNOSIS — S39012A Strain of muscle, fascia and tendon of lower back, initial encounter: Secondary | ICD-10-CM | POA: Diagnosis not present

## 2020-09-17 DIAGNOSIS — Z87891 Personal history of nicotine dependence: Secondary | ICD-10-CM | POA: Insufficient documentation

## 2020-09-17 NOTE — ED Notes (Signed)
Pt requested to leave and Dr aware.

## 2020-09-17 NOTE — ED Provider Notes (Signed)
[facility] Provider Note   CSN: [id number] Arrival date & time: 09/17/20  0453     History Chief Complaint  Patient presents with   Lytle Michaels    Shannon Bentley is a 30 y.o. female.  Patient complains that she took a fall today.  She is just a few days postop having a vaginal delivery with an episiotomy.  She complains of back pain and some vaginal bleeding.  The history is provided by the patient and medical records. No language interpreter was used.  Fall This is a new problem. The current episode started 3 to 5 hours ago. The problem occurs rarely. The problem has been resolved. Pertinent negatives include no chest pain, no abdominal pain and no headaches. Exacerbated by: Movement. Nothing relieves the symptoms. She has tried nothing for the symptoms. The treatment provided no relief.      Past Medical History:  Diagnosis Date   Anemia    Crohn's disease ([place])    GERD (gastroesophageal reflux disease)    Sleep apnea     Patient Active Problem List   Diagnosis Date Noted   Abnormal Pap smear of cervix 05/17/2020   Obesity affecting pregnancy in third trimester, antepartum 04/13/2020   Left ovarian cyst 04/13/2020   Abnormal chromosomal and genetic finding on antenatal screening mother 03/29/2020   Gestational diabetes mellitus, class A1 03/23/2020   Pre-diabetes 03/17/2020   Supervision of high risk pregnancy, antepartum 03/05/2020   History of Crohn's disease 01/23/2020   Constipation 01/23/2020   Vitamin D deficiency 05/02/2018   Morbid obesity ([place]) 05/02/2018   Diarrhea    Crohn's ileitis, without complications ([place]) 38/75/6433    Past Surgical History:  Procedure Laterality Date   APPENDECTOMY     BIOPSY  07/03/2017   Procedure: BIOPSY;  Surgeon: Danie Binder, MD;  Location[facility];  Service: Endoscopy;;  ileum random colon   COLONOSCOPY  07/2015   Dr. Britta Mccreedy: evidence of prior surgical anastomosis, multiple biopsies. 2 small  ulcers at anastomosis. TI and remaining colon normal, without evidence of IBD. Path with acute colitis with reactive changes, features of Crohn's not specifically identified. non-specific. differentials including infection, drug effects, self-limited colitis   COLONOSCOPY WITH PROPOFOL N/A 07/03/2017   Procedure: COLONOSCOPY WITH PROPOFOL;  Surgeon: Danie Binder, MD;  Location: [facility];  Service: Endoscopy;  Laterality: N/A;  8:30am   EXPLORATORY LAPAROTOMY  05/2015   Dr. 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 path with crohn's disease involving both ileum and cecum   TONSILLECTOMY     tonsils and adenoids       OB History     Gravida  1   Para  1   Term  1   Preterm      AB      Living  1      SAB      IAB      Ectopic      Multiple  0   Live Births  1           Family History  Problem Relation Age of Onset   Diabetes Mother    Hypertension Mother    Diabetes Father    Hypertension Father    Other Father        Covid   Heart attack Maternal Grandmother    Fibromyalgia Sister    Migraines Sister    Colon cancer  Neg Hx    Colon polyps Neg Hx    Inflammatory bowel disease Neg Hx     Social History   Tobacco Use   Smoking status: Former    Packs/day: 0.25    Years: 1.00    Pack years: 0.25    Types: E-cigarettes, Cigarettes    Quit date: 06/28/2015    Years since quitting: 5.2   Smokeless tobacco: Never   Tobacco comments:    vaped in past  Vaping Use   Vaping Use: Former   Substances: Flavoring  Substance Use Topics   Alcohol use: Not Currently    Comment: rare wine   Drug use: No    Home Medications Prior to Admission medications   Medication Sig Start Date End Date Taking? Authorizing Provider  calcium carbonate (TUMS) 500 MG chewable tablet Chew 1 tablet (200 mg of elemental calcium total) by mouth 3 (three) times daily.  10/31/17   Fields, Marga Melnick, MD  ibuprofen (ADVIL) 600 MG tablet Take 1 tablet (600 mg total) by mouth every 6 (six) hours. 09/15/20   Waldon Merl, MD    Allergies    Amoxicillin-pot clavulanate, Bactrim [sulfamethoxazole-trimethoprim], Benadryl [diphenhydramine], and Dilaudid [hydromorphone hcl]  Review of Systems   Review of Systems  Constitutional:  Negative for appetite change and fatigue.  HENT:  Negative for congestion, ear discharge and sinus pressure.   Eyes:  Negative for discharge.  Respiratory:  Negative for cough.   Cardiovascular:  Negative for chest pain.  Gastrointestinal:  Negative for abdominal pain and diarrhea.  Genitourinary:  Negative for frequency and hematuria.  Musculoskeletal:  Positive for back pain.  Skin:  Negative for rash.  Neurological:  Negative for seizures and headaches.  Psychiatric/Behavioral:  Negative for hallucinations.    Physical Exam Updated Vital Signs BP 109/87   Pulse (!) 103   Temp 98.6 F (37 C) (Oral)   Resp 18   Ht 5' 1"  (1.549 m)   Wt 123.4 kg   LMP 09/13/2020 Comment: Pt delivered 09/13/20. BTF  SpO2 100%   Breastfeeding Unknown   BMI 51.39 kg/m   Physical Exam Vitals and nursing note reviewed.  Constitutional:      Appearance: She is well-developed.  HENT:     Head: Normocephalic.     Nose: Nose normal.  Eyes:     General: No scleral icterus.    Conjunctiva/sclera: Conjunctivae normal.  Neck:     Thyroid: No thyromegaly.  Cardiovascular:     Rate and Rhythm: Normal rate and regular rhythm.     Heart sounds: No murmur heard.   No friction rub. No gallop.  Pulmonary:     Breath sounds: No stridor. No wheezing or rales.  Chest:     Chest wall: No tenderness.  Abdominal:     General: There is no distension.     Tenderness: There is no abdominal tenderness. There is no rebound.  Musculoskeletal:        General: Normal range of motion.     Cervical back: Neck supple.     Comments: Tender lumbar muscle   Lymphadenopathy:     Cervical: No cervical adenopathy.  Skin:    Findings: No erythema or rash.  Neurological:     Mental Status: She is alert and oriented to person, place, and time.     Motor: No abnormal muscle tone.     Coordination: Coordination normal.  Psychiatric:        Behavior: Behavior normal.  ED Results / Procedures / Treatments   Labs (all labs ordered are listed, but only abnormal results are displayed) Labs Reviewed - No data to display  EKG None  Radiology DG Lumbar Spine Complete  Result Date: 09/17/2020 CLINICAL DATA:  Low back pain EXAM: LUMBAR SPINE - COMPLETE 4+ VIEW COMPARISON:  CT 07/07/2018 FINDINGS: There is no evidence of lumbar spine fracture. Alignment is normal. Intervertebral disc spaces are maintained. IMPRESSION: Negative. Electronically Signed   By: Lucrezia Europe M.D.   On: 09/17/2020 08:13    Procedures Procedures   Medications Ordered in ED Medications - No data to display  ED Course  I have reviewed the triage vital signs and the nursing notes.  Pertinent labs & imaging results that were available during my care of the patient were reviewed by me and considered in my medical decision making (see chart for details).    MDM Rules/Calculators/A&P                           Patient with lumbar strain.  Patient left AMA before I was able to do a pelvic exam to examine her episiotomy.  She stated she needed to go home for her doctor's appointment and she will follow-up with OB/GYN Final Clinical Impression(s) / ED Diagnoses Final diagnoses:  None    Rx / DC Orders ED Discharge Orders     None        Milton Ferguson, MD 09/20/20 [phone]

## 2020-09-17 NOTE — ED Triage Notes (Signed)
Pt states fall while getting out of the shower. Pt c/o lower back pain, and increased in vaginal bleeding since fall. Pt recently gave vaginal birth 09/13/20.

## 2020-09-21 DIAGNOSIS — Z029 Encounter for administrative examinations, unspecified: Secondary | ICD-10-CM

## 2020-09-22 ENCOUNTER — Encounter: Payer: Self-pay | Admitting: *Deleted

## 2020-09-25 ENCOUNTER — Telehealth (HOSPITAL_COMMUNITY): Payer: Self-pay

## 2020-09-25 NOTE — Telephone Encounter (Signed)
No answer. Left message to return nurse call.  Sharyn Lull [facility] 09/25/2020,1132

## 2020-10-19 ENCOUNTER — Other Ambulatory Visit: Payer: Self-pay

## 2020-10-19 ENCOUNTER — Ambulatory Visit (INDEPENDENT_AMBULATORY_CARE_PROVIDER_SITE_OTHER): Payer: Medicaid Other | Admitting: Women's Health

## 2020-10-19 ENCOUNTER — Encounter: Payer: Self-pay | Admitting: Women's Health

## 2020-10-19 DIAGNOSIS — Z8632 Personal history of gestational diabetes: Secondary | ICD-10-CM

## 2020-10-19 DIAGNOSIS — Z30017 Encounter for initial prescription of implantable subdermal contraceptive: Secondary | ICD-10-CM | POA: Insufficient documentation

## 2020-10-19 NOTE — Progress Notes (Signed)
POSTPARTUM VISIT Patient name: Shannon Bentley MRN [id number]  Date of birth: [birth year] Chief Complaint:   Postpartum Care  History of Present Illness:   Shannon Bentley is a 30 y.o. G29P1001 African American female being seen today for a postpartum visit. She is 5 weeks postpartum following a spontaneous vaginal delivery at 40.1 gestational weeks. IOL: yes, for diabetes mellitus A1/BDM. Anesthesia: epidural.  Laceration: 1st degree.  Complications: none. Inpatient contraception: yes nexplanon 09/14/20 .   Pregnancy complicated by B5/ZWC dx @ 18wks . Tobacco use: former . Substance use disorder: no. Last pap smear: 05/11/20 and results were ASCUS w/ HRHPV negative. Next pap smear due: getting records [facility], reports multiple abnormal paps in past, but things 1 prior to this one was normal No LMP recorded. Patient has had an implant.  Postpartum course has been uncomplicated. Bleeding none. Bowel function is normal. Bladder function is normal. Urinary incontinence? no, fecal incontinence? no Patient is not sexually active. Last sexual activity: prior to birth of baby. Desired contraception: PP Nexplanon placed. Patient does not know want a pregnancy in the future.  Desired family size is uncertain #of children.   Upstream - 10/19/20 1434       Pregnancy Intention Screening   Does the patient want to become pregnant in the next year? Unsure    Does the patient's partner want to become pregnant in the next year? Unsure    Would the patient like to discuss contraceptive options today? No      Contraception Wrap Up   Current Method Hormonal Implant    End Method Hormonal Implant    Contraception Counseling Provided No            The pregnancy intention screening data noted above was reviewed. Potential methods of contraception were discussed. The patient elected to proceed with Hormonal Implant.  Edinburgh Postpartum Depression Screening: negative  Edinburgh Postnatal  Depression Scale - 10/19/20 1433       Edinburgh Postnatal Depression Scale:  In the Past 7 Days   I have been able to laugh and see the funny side of things. 0    I have looked forward with enjoyment to things. 0    I have blamed myself unnecessarily when things went wrong. 0    I have been anxious or worried for no good reason. 0    I have felt scared or panicky for no good reason. 0    Things have been getting on top of me. 1    I have been so unhappy that I have had difficulty sleeping. 0    I have felt sad or miserable. 0    I have been so unhappy that I have been crying. 0    The thought of harming myself has occurred to me. 0    Edinburgh Postnatal Depression Scale Total 1             GAD 7 : Generalized Anxiety Score 06/09/2020 03/16/2020 01/23/2020  Nervous, Anxious, on Edge 0 0 0  Control/stop worrying 0 0 0  Worry too much - different things 0 0 0  Trouble relaxing 0 0 0  Restless 0 3 0  Easily annoyed or irritable 0 1 0  Afraid - awful might happen 0 0 0  Total GAD 7 Score 0 4 0     Baby's course has been uncomplicated. Baby is feeding by bottle. Infant has a pediatrician/family doctor? Yes.  Childcare strategy if returning to work/school:  didn't discuss .  Pt has material needs met for her and baby: Yes.   Review of Systems:   Pertinent items are noted in HPI Denies Abnormal vaginal discharge w/ itching/odor/irritation, headaches, visual changes, shortness of breath, chest pain, abdominal pain, severe nausea/vomiting, or problems with urination or bowel movements. Pertinent History Reviewed:  Reviewed past medical,surgical, obstetrical and family history.  Reviewed problem list, medications and allergies. OB History  Gravida Para Term Preterm AB Living  1 1 1     1   SAB IAB Ectopic Multiple Live Births        0 1    # Outcome Date GA Lbr Len/2nd Weight Sex Delivery Anes PTL Lv  1 Term 09/13/20 [redacted]w[redacted]d/ 02:02 6 lb 13.7 oz (3.11 kg) F Vag-Spont EPI  LIV    Physical Assessment:   Vitals:   10/19/20 1435  BP: 114/70  Pulse: 88  Weight: 251 lb (113.9 kg)  Height: 5' 1"  (1.549 m)  Body mass index is 47.43 kg/m.       Physical Examination:   General appearance: alert, well appearing, and in no distress  Mental status: alert, oriented to person, place, and time  Skin: warm & dry   Cardiovascular: normal heart rate noted   Respiratory: normal respiratory effort, no distress   Breasts: deferred, no complaints   Abdomen: soft, non-tender   Pelvic: lac well healed, small suture remnant present  Rectal: not examined  Extremities: Edema: none   Chaperone: ACelene Squibb        No results found for this or any previous visit (from the past 24 hour(s)).  Assessment & Plan:  1) Postpartum exam 2) 5 wks s/p spontaneous vaginal delivery after IOL for A1/BDM 3) bottle feeding 4) Depression screening 5) Contraception > s/p Nexplanon insertion 7/26 6) [facility]  Essential components of care per ACOG recommendations:  1.  Mood and well being:  If positive depression screen, discussed and plan developed.  If using tobacco we discussed reduction/cessation and risk of relapse If current substance abuse, we discussed and referral to local resources was offered.   2. Infant care and feeding:  If breastfeeding, discussed returning to work, pumping, breastfeeding-associated pain, guidance regarding return to fertility while lactating if not using another method. If needed, patient was provided with a letter to be allowed to pump q 2-3hrs to support lactation in a private location with access to a refrigerator to store breastmilk.   Recommended that all caregivers be immunized for flu, pertussis and other preventable communicable diseases If pt does not have material needs met for her/baby, referred to local resources for help obtaining these.  3. Sexuality, contraception and  birth spacing Provided guidance regarding sexuality, management of dyspareunia, and resumption of intercourse Discussed avoiding interpregnancy interval <665ms and recommended birth spacing of 18 months  4. Sleep and fatigue Discussed coping options for fatigue and sleep disruption Encouraged family/partner/community support of 4 hrs of uninterrupted sleep to help with mood and fatigue  5. Physical recovery  If pt had a C/S, assessed incisional pain and providing guidance on normal vs prolonged recovery If pt had a laceration, perineal healing and pain reviewed.  If urinary or fecal incontinence, discussed management and referred to PT or uro/gyn if indicated  Patient is safe to resume physical activity. Discussed attainment of healthy weight.  6.  Chronic disease management Discussed pregnancy complications if any, and their implications for  future childbearing and long-term maternal health. Review recommendations for prevention of recurrent pregnancy complications, such as 17 hydroxyprogesterone caproate to reduce risk for recurrent PTB not applicable, or aspirin to reduce risk of preeclampsia not applicable. Pt had GDM: yes. If yes, 2hr GTT scheduled: yes. Reviewed medications and non-pregnant dosing including consideration of whether pt is breastfeeding using a reliable resource such as LactMed: not applicable Referred for f/u w/ PCP or subspecialist providers as indicated: not applicable  7. Health maintenance Mammogram at 30yo or earlier if indicated Pap smears as indicated  Meds: No orders of the defined types were placed in this encounter.   Follow-up: Return in about 3 weeks (around 11/09/2020) for sugar test (no visit); please get pap records from [place].   No orders of the defined types were placed in this encounter.   [place], [facility] 10/19/2020 3:20 PM

## 2020-10-19 NOTE — Patient Instructions (Signed)
You will have your sugar test next visit.  Please do not eat or drink anything after midnight the night before you come, not even water.  You will be here for at least two hours.  Please make an appointment online for the bloodwork at [email or web address] for 8:30am (or as close to this as possible). Make sure you select the [facility] service center. The day of the appointment, check in with our office first, then you will go to [facility] to start the sugar test.

## 2020-11-09 ENCOUNTER — Other Ambulatory Visit: Payer: Self-pay | Admitting: Women's Health

## 2020-11-09 ENCOUNTER — Other Ambulatory Visit: Payer: Medicaid Other

## 2020-11-26 ENCOUNTER — Other Ambulatory Visit: Payer: Medicaid Other

## 2020-12-02 ENCOUNTER — Encounter: Payer: Medicaid Other | Admitting: Women's Health

## 2020-12-02 ENCOUNTER — Other Ambulatory Visit: Payer: Medicaid Other

## 2020-12-02 DIAGNOSIS — O24410 Gestational diabetes mellitus in pregnancy, diet controlled: Secondary | ICD-10-CM

## 2021-01-07 ENCOUNTER — Encounter: Payer: Medicaid Other | Admitting: Adult Health

## 2021-01-21 ENCOUNTER — Ambulatory Visit (INDEPENDENT_AMBULATORY_CARE_PROVIDER_SITE_OTHER): Payer: Medicaid Other | Admitting: Adult Health

## 2021-01-21 ENCOUNTER — Encounter: Payer: Self-pay | Admitting: Adult Health

## 2021-01-21 ENCOUNTER — Other Ambulatory Visit: Payer: Self-pay

## 2021-01-21 VITALS — BP 105/79 | HR 91 | Ht 61.0 in | Wt 258.5 lb

## 2021-01-21 DIAGNOSIS — Z3202 Encounter for pregnancy test, result negative: Secondary | ICD-10-CM

## 2021-01-21 DIAGNOSIS — Z3046 Encounter for surveillance of implantable subdermal contraceptive: Secondary | ICD-10-CM | POA: Diagnosis not present

## 2021-01-21 DIAGNOSIS — Z30011 Encounter for initial prescription of contraceptive pills: Secondary | ICD-10-CM

## 2021-01-21 LAB — POCT URINE PREGNANCY: Preg Test, Ur: NEGATIVE

## 2021-01-21 MED ORDER — LO LOESTRIN FE 1 MG-10 MCG / 10 MCG PO TABS: 1.0000 | ORAL_TABLET | Freq: Every day | ORAL | 11 refills | Status: DC

## 2021-01-21 NOTE — Progress Notes (Signed)
  Subjective:     Patient ID: Shannon Bentley, female   DOB: 1990-05-21, 30 y.o.   MRN: [id number]  HPI Shannon Bentley is a 30 year old black female,single in for nexplanon removal, says arm hurts and itches and bruises more. PCP is Dr Manuella Ghazi.  Lab Results  Component Value Date   DIAGPAP (A) 05/11/2020    - Atypical squamous cells of undetermined significance (ASC-US)   HPVHIGH Negative 05/11/2020    Review of Systems Wants nexplanon removed Has pain in arm and itching  Bruising some Reviewed past medical,surgical, social and family history. Reviewed medications and allergies.     Objective:   Physical Exam BP 105/79 (BP Location: Right Arm, Patient Position: Sitting, Cuff Size: Large)   Pulse 91   Ht 5' 1"  (1.549 m)   Wt 258 lb 8 oz (117.3 kg)   Breastfeeding No   BMI 48.84 kg/m     UPT is negative. Consent signed and time out called. Left arm cleansed with betadine, and injected with 1.5 cc 2% lidocaine and waited til numb.Under sterile technique a #11 blade was used to make small vertical incision, and a curved forceps was used to easily remove rod. Steri strips applied. Pressure dressing applied.  Fall risk is low  Upstream - 01/21/21 1234       Pregnancy Intention Screening   Does the patient want to become pregnant in the next year? No    Does the patient's partner want to become pregnant in the next year? No    Would the patient like to discuss contraceptive options today? Yes      Contraception Wrap Up   Current Method Hormonal Implant    End Method Oral Contraceptive    Contraception Counseling Provided Yes             Assessment:     1. Pregnancy examination or test, negative result   2. Encounter for Nexplanon removal Use condoms x 4 weeks, keep clean and dry x 24 hours, no heavy lifting, keep steri strips on x 72 hours, Keep pressure dressing on x 24 hours. Follow up prn problems.   3. Encounter for initial prescription of contraceptive pills Start lo  leostrin today Meds ordered this encounter  Medications   Norethindrone-Ethinyl Estradiol-Fe Biphas (LO LOESTRIN FE) 1 MG-10 MCG / 10 MCG tablet    Sig: Take 1 tablet by mouth daily. Take 1 daily by mouth    Dispense:  28 tablet    Refill:  11    BIN [id number], PCN [id number], GRP [id number]    Order Specific Question:   Supervising Provider    Answer:   Florian Buff [[id number]]       Plan:     Follow up in 3 months for ROS

## 2021-01-21 NOTE — Patient Instructions (Addendum)
Use condoms x 4 weeks, keep clean and dry x 24 hours, no heavy lifting, keep steri strips on x 72 hours, Keep pressure dressing on x 24 hours. Follow up prn problems.

## 2021-04-22 ENCOUNTER — Ambulatory Visit: Payer: Medicaid Other | Admitting: Adult Health

## 2021-08-18 ENCOUNTER — Telehealth: Payer: Self-pay

## 2021-08-18 ENCOUNTER — Ambulatory Visit (INDEPENDENT_AMBULATORY_CARE_PROVIDER_SITE_OTHER): Payer: PRIVATE HEALTH INSURANCE | Admitting: Family Medicine

## 2021-08-18 ENCOUNTER — Encounter: Payer: Self-pay | Admitting: Family Medicine

## 2021-08-18 ENCOUNTER — Telehealth: Payer: Self-pay | Admitting: *Deleted

## 2021-08-18 VITALS — BP 128/85 | HR 94 | Temp 98.4°F | Ht 61.0 in | Wt 251.0 lb

## 2021-08-18 DIAGNOSIS — E559 Vitamin D deficiency, unspecified: Secondary | ICD-10-CM

## 2021-08-18 DIAGNOSIS — L2082 Flexural eczema: Secondary | ICD-10-CM | POA: Diagnosis not present

## 2021-08-18 DIAGNOSIS — K5090 Crohn's disease, unspecified, without complications: Secondary | ICD-10-CM | POA: Insufficient documentation

## 2021-08-18 DIAGNOSIS — G5603 Carpal tunnel syndrome, bilateral upper limbs: Secondary | ICD-10-CM | POA: Insufficient documentation

## 2021-08-18 DIAGNOSIS — L309 Dermatitis, unspecified: Secondary | ICD-10-CM | POA: Insufficient documentation

## 2021-08-18 DIAGNOSIS — Z8632 Personal history of gestational diabetes: Secondary | ICD-10-CM

## 2021-08-18 DIAGNOSIS — K5080 Crohn's disease of both small and large intestine without complications: Secondary | ICD-10-CM | POA: Diagnosis not present

## 2021-08-18 LAB — BAYER DCA HB A1C WAIVED: HB A1C (BAYER DCA - WAIVED): 5.9 % — ABNORMAL HIGH (ref 4.8–5.6)

## 2021-08-18 MED ORDER — PREDNISONE 20 MG PO TABS: 20.0000 mg | ORAL_TABLET | Freq: Every day | ORAL | 0 refills | Status: AC

## 2021-08-18 MED ORDER — CLOBETASOL PROPIONATE 0.05 % EX CREA: 1.0000 | TOPICAL_CREAM | Freq: Two times a day (BID) | CUTANEOUS | 6 refills | Status: DC

## 2021-08-18 MED ORDER — BETAMETHASONE DIPROPIONATE 0.05 % EX CREA: TOPICAL_CREAM | Freq: Two times a day (BID) | CUTANEOUS | 0 refills | Status: DC

## 2021-08-18 NOTE — Telephone Encounter (Signed)
Babette Relic KeyVangie Bicker - PA Case ID: [id number] - Rx #: [id number] Need help? Call us at [phone] Outcome Deniedtoday Request Reference Number: [id number]. BETAMETH DIP CRE 0.05% is denied for not meeting the prior authorization requirement(s). For further questions, call [place] [place] at [phone] for more information. Drug Betamethasone Dipropionate 0.05% cream Form [employer] Medicaid Electronic Prior Authorization Form (2017 NCPDP) Original Claim Info 56 PA Req, Dr submit ePA at [facility].comor MD Call [phone] NF-3 DS SUBMIT PA# 72 PA TYPE8

## 2021-08-18 NOTE — Telephone Encounter (Signed)
Shannon Bentley  KeyVangie Bicker PA Case ID: [id number] Rx #: [id number]  Status: Sent to Plan  Drug: Betamethasone Dipropionate 0.05% cream

## 2021-08-18 NOTE — Addendum Note (Signed)
Addended by: Baruch Gouty on: 08/18/2021 11:45 AM   Modules accepted: Orders

## 2021-08-18 NOTE — Progress Notes (Addendum)
Subjective:  Patient ID: Shannon Bentley, female    DOB: 1990/06/01, 31 y.o.   MRN: [id number]  Patient Care Team: Baruch Gouty, FNP as PCP - General (Family Medicine) Danie Binder, MD (Inactive) as Consulting Physician (Gastroenterology)   Chief Complaint:  New Patient (Initial Visit) (Crohn's/Atopical dermatitis/Bilateral wrist pain)   HPI: Shannon Bentley is a 30 y.o. female presenting on 08/18/2021 for New Patient (Initial Visit) (Crohn's/Atopical dermatitis/Bilateral wrist pain)   Pt presents today to establish care with new PCP, she was formerly followed by Dr. Manuella Ghazi in [place], [place]. States she has not seen her PCP in several years. She has a history of Crohn's Disease and is s/p hemicolectomy with ileocolic anastomosis in 6606. No recent flares, has been managing with diet. States over the last several weeks she has noticed increased diarrhea. Denies melena or hematochezia. No abdominal pain, nausea, vomiting, fever, chills, weakness, decreased urination, or fatigue. She is obese and states she has started to diet and walk more but has not lost weight. She is on Vit D repletion therapy for Vit D deficiency. No recent fractures or trouble walking. She is currently on OCPs, had Nexplanon removed due to side effects. States she is tolerating OCPs well. Does have a history of gestational diabetes which was diet controlled. She reports over the last few weeks both wrists have been hurting with some tingling in her fingers. She does use her hands all day at work. She denies injury. States aching to shooting pain, moderate, and intermittent. Worse with certain movements and palpation. She has not tried anything for relief.   EHR reviewed in detail and previous medical records have been requested.      Relevant past medical, surgical, family, and social history reviewed and updated as indicated.  Allergies and medications reviewed and updated. Data reviewed: Chart in Epic.   Past Medical  History:  Diagnosis Date   Anemia    Crohn's disease ([place])    GERD (gastroesophageal reflux disease)    Sleep apnea     Past Surgical History:  Procedure Laterality Date   APPENDECTOMY     BIOPSY  07/03/2017   Procedure: BIOPSY;  Surgeon: Danie Binder, MD;  Location: [facility];  Service: Endoscopy;;  ileum random colon   COLONOSCOPY  07/2015   Dr. Britta Mccreedy: evidence of prior surgical anastomosis, multiple biopsies. 2 small ulcers at anastomosis. TI and remaining colon normal, without evidence of IBD. Path with acute colitis with reactive changes, features of Crohn's not specifically identified. non-specific. differentials including infection, drug effects, self-limited colitis   COLONOSCOPY WITH PROPOFOL N/A 07/03/2017   Procedure: COLONOSCOPY WITH PROPOFOL;  Surgeon: Danie Binder, MD;  Location: [facility];  Service: Endoscopy;  Laterality: N/A;  8:30am   EXPLORATORY LAPAROTOMY  05/2015   Dr. 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 path with crohn's disease involving both ileum and cecum   TONSILLECTOMY     tonsils and adenoids      Social History   Socioeconomic History   Marital status: Single    Spouse name: Not on file   Number of children: Not on file   Years of education: Not on file   Highest education level: Not on file  Occupational History   Not on file  Tobacco Use   Smoking status: Former    Packs/day: 0.25    Years: 1.00    Total pack years:  0.25    Types: E-cigarettes, Cigarettes    Quit date: 06/28/2015    Years since quitting: 6.1   Smokeless tobacco: Never   Tobacco comments:    vaped in past  Vaping Use   Vaping Use: Former   Substances: Flavoring  Substance and Sexual Activity   Alcohol use: Not Currently    Comment: rare wine   Drug use: No   Sexual activity: Not Currently  Other Topics Concern   Not on file  Social History  Narrative   WORKS AT [employer]. WANTS TO GO TO SCHOOL FOR NURSING NEXT SPRING.   Social Determinants of Health   Financial Resource Strain: Low Risk  (10/19/2020)   Overall Financial Resource Strain (CARDIA)    Difficulty of Paying Living Expenses: Not hard at all  Food Insecurity: No Food Insecurity (10/19/2020)   Hunger Vital Sign    Worried About Running Out of Food in the Last Year: Never true    Ran Out of Food in the Last Year: Never true  Transportation Needs: No Transportation Needs (10/19/2020)   PRAPARE - Hydrologist (Medical): No    Lack of Transportation (Non-Medical): No  Physical Activity: Insufficiently Active (10/19/2020)   Exercise Vital Sign    Days of Exercise per Week: 3 days    Minutes of Exercise per Session: 30 min  Stress: No Stress Concern Present (10/19/2020)   [place]    Feeling of Stress : Not at all  Social Connections: Moderately Integrated (10/19/2020)   Social Connection and Isolation Panel [NHANES]    Frequency of Communication with Friends and Family: More than three times a week    Frequency of Social Gatherings with Friends and Family: Three times a week    Attends Religious Services: 1 to 4 times per year    Active Member of Clubs or Organizations: Yes    Attends Archivist Meetings: 1 to 4 times per year    Marital Status: Never married  Intimate Partner Violence: Not At Risk (10/19/2020)   Humiliation, Afraid, Rape, and Kick questionnaire    Fear of Current or Ex-Partner: No    Emotionally Abused: No    Physically Abused: No    Sexually Abused: No    Outpatient Encounter Medications as of 08/18/2021  Medication Sig   Ascorbic Acid (VITAMIN C PO) Take by mouth.   betamethasone dipropionate 0.05 % cream Apply topically 2 (two) times daily.   CALCIUM PO Take by mouth.   Cholecalciferol (VITAMIN D3 PO) Take by mouth.   ELDERBERRY PO Take by  mouth.   Levocetirizine Dihydrochloride (XYZAL PO) Take by mouth.   Multiple Vitamin (MULTIVITAMIN) tablet Take 1 tablet by mouth daily.   Norethindrone-Ethinyl Estradiol-Fe Biphas (LO LOESTRIN FE) 1 MG-10 MCG / 10 MCG tablet Take 1 tablet by mouth daily. Take 1 daily by mouth   predniSONE (DELTASONE) 20 MG tablet Take 1 tablet (20 mg total) by mouth daily with breakfast for 5 days.   [DISCONTINUED] clobetasol cream (TEMOVATE) 8.11 % Apply 1 application topically 2 (two) times daily.   No facility-administered encounter medications on file as of 08/18/2021.    Allergies  Allergen Reactions   Amoxicillin-Pot Clavulanate Hives   Bactrim [Sulfamethoxazole-Trimethoprim] Hives   Benadryl [Diphenhydramine] Hives   Dilaudid [Hydromorphone Hcl] Hives    Review of Systems  Constitutional:  Negative for activity change, appetite change, chills, diaphoresis, fatigue, fever and unexpected weight change.  HENT:  Positive for rhinorrhea.   Eyes:  Negative for photophobia and visual disturbance.  Respiratory:  Negative for cough, chest tightness and shortness of breath.   Cardiovascular:  Negative for chest pain, palpitations and leg swelling.  Gastrointestinal:  Positive for abdominal pain and diarrhea. Negative for abdominal distention, anal bleeding, blood in stool, constipation, nausea, rectal pain and vomiting.  Endocrine: Negative for cold intolerance, heat intolerance, polydipsia, polyphagia and polyuria.  Genitourinary:  Negative for decreased urine volume and difficulty urinating.  Musculoskeletal:  Positive for arthralgias. Negative for back pain, gait problem, joint swelling, myalgias, neck pain and neck stiffness.  Skin:  Positive for color change and rash. Negative for pallor and wound.  Neurological:  Negative for dizziness, tremors, seizures, syncope, facial asymmetry, speech difficulty, weakness, light-headedness, numbness and headaches.  Psychiatric/Behavioral:  Negative for confusion.    All other systems reviewed and are negative.       Objective:  BP 128/85   Pulse 94   Temp 98.4 F (36.9 C)   Ht 5' 1"  (1.549 m)   Wt 251 lb (113.9 kg)   SpO2 96%   BMI 47.43 kg/m    Wt Readings from Last 3 Encounters:  08/18/21 251 lb (113.9 kg)  01/21/21 258 lb 8 oz (117.3 kg)  10/19/20 251 lb (113.9 kg)    Physical Exam Vitals and nursing note reviewed.  Constitutional:      General: She is not in acute distress.    Appearance: Normal appearance. She is well-developed and well-groomed. She is morbidly obese. She is not ill-appearing, toxic-appearing or diaphoretic.  HENT:     Head: Normocephalic and atraumatic.     Jaw: There is normal jaw occlusion.     Right Ear: Hearing normal.     Left Ear: Hearing normal.     Nose: Nose normal.     Mouth/Throat:     Lips: Pink.     Mouth: Mucous membranes are moist.     Pharynx: Oropharynx is clear. Uvula midline.  Eyes:     General: Lids are normal.     Conjunctiva/sclera: Conjunctivae normal.     Pupils: Pupils are equal, round, and reactive to light.  Neck:     Thyroid: No thyroid mass, thyromegaly or thyroid tenderness.     Vascular: No carotid bruit or JVD.     Trachea: Trachea and phonation normal.  Cardiovascular:     Rate and Rhythm: Normal rate and regular rhythm.     Chest Wall: PMI is not displaced.     Pulses: Normal pulses.     Heart sounds: Normal heart sounds. No murmur heard.    No friction rub. No gallop.  Pulmonary:     Effort: Pulmonary effort is normal. No respiratory distress.     Breath sounds: Normal breath sounds. No wheezing.  Abdominal:     General: Bowel sounds are normal. There is no distension or abdominal bruit.     Palpations: Abdomen is soft. There is no hepatomegaly or splenomegaly.     Tenderness: There is no abdominal tenderness. There is no right CVA tenderness or left CVA tenderness.     Hernia: No hernia is present.  Musculoskeletal:        General: Normal range of motion.      Right forearm: Normal.     Left forearm: Normal.     Right wrist: Tenderness present. No swelling, deformity, effusion, lacerations, bony tenderness, snuff box tenderness or crepitus. Normal range of motion. Normal  pulse.     Left wrist: Tenderness present. No swelling, deformity, effusion, lacerations, bony tenderness, snuff box tenderness or crepitus. Normal range of motion. Normal pulse.     Right hand: Normal.     Left hand: Normal.     Cervical back: Normal range of motion and neck supple.     Right lower leg: No edema.     Left lower leg: No edema.     Comments: Bilateral Wrists: positive Phalen and Tinel signs. No deformity, crepitus and swelling.   Lymphadenopathy:     Cervical: No cervical adenopathy.  Skin:    General: Skin is warm and dry.     Capillary Refill: Capillary refill takes less than 2 seconds.     Coloration: Skin is not cyanotic, jaundiced or pale.     Findings: Erythema and rash present. Rash is papular.     Comments: Erythematous, clustered papules to fingers and elbows  Neurological:     General: No focal deficit present.     Mental Status: She is alert and oriented to person, place, and time.     Sensory: Sensation is intact.     Motor: Motor function is intact.     Coordination: Coordination is intact.     Gait: Gait is intact.     Deep Tendon Reflexes: Reflexes are normal and symmetric.  Psychiatric:        Attention and Perception: Attention and perception normal.        Mood and Affect: Mood and affect normal.        Speech: Speech normal.        Behavior: Behavior normal. Behavior is cooperative.        Thought Content: Thought content normal.        Cognition and Memory: Cognition and memory normal.        Judgment: Judgment normal.     Results for orders placed or performed in visit on 01/21/21  POCT urine pregnancy  Result Value Ref Range   Preg Test, Ur Negative Negative       Pertinent labs & imaging results that were available during  my care of the patient were reviewed by me and considered in my medical decision making.  Assessment & Plan:  Marilea was seen today for new patient (initial visit).  Diagnoses and all orders for this visit:  Crohn's disease of both small and large intestine without complication ([place]) Has not seen GI in 3 years. No recent flares but has noticed more diarrhea stools over the last several days. Will burst with steroids to see if beneficial. If not, will refer back to GI. Will check labs today. Diet discussed.  -     CBC with Differential/Platelet -     predniSONE (DELTASONE) 20 MG tablet; Take 1 tablet (20 mg total) by mouth daily with breakfast for 5 days.  Morbid obesity ([place]) Diet and exercise encouraged. Aware to check to see if [facility] or Kirke Shaggy would be covered by insurance. If covered, will discuss potential therapy. Labs pending.  -     CBC with Differential/Platelet -     CMP14+EGFR -     Bayer DCA Hb A1c Waived -     Thyroid Panel With TSH -     Lipid panel  Flexural eczema Has been using clobetasol cream for years. Has not been effective over the last several months. Will switch to below to see if beneficial. Symptomatic care discussed in detail. Aware to use jar emollients.  -  betamethasone dipropionate 0.05 % cream; Apply topically 2 (two) times daily. -     predniSONE (DELTASONE) 20 MG tablet; Take 1 tablet (20 mg total) by mouth daily with breakfast for 5 days.  Vitamin D deficiency On repletion therapy. Will check levels today to see if dosing is adequate.  -     VITAMIN D 25 Hydroxy (Vit-D Deficiency, Fractures)  History of gestational diabetes Was diet controlled. Has not had recent labs. Will check today and start treatment if warranted. Diet and exercise encouraged.  -     CBC with Differential/Platelet -     CMP14+EGFR -     Bayer DCA Hb A1c Waived -     Microalbumin / creatinine urine ratio -     Thyroid Panel With TSH -     Lipid panel  Carpal tunnel  syndrome, bilateral Bilateral carpal tunnel. Will burst with steroids to see if beneficial. Bilateral wrist splints applied in office today. Symptomatic care discussed in detail. Aware if braces and steroids are not beneficial, will refer to ortho. -     predniSONE (DELTASONE) 20 MG tablet; Take 1 tablet (20 mg total) by mouth daily with breakfast for 5 days.     Continue all other maintenance medications.  Follow up plan: Return in 3 months (on 11/18/2021), or if symptoms worsen or fail to improve.   Continue healthy lifestyle choices, including diet (rich in fruits, vegetables, and lean proteins, and low in salt and simple carbohydrates) and exercise (at least 30 minutes of moderate physical activity daily).  Educational handout given for Crohn's Disease, Carpal Tunnel Syndrome  The above assessment and management plan was discussed with the patient. The patient verbalized understanding of and has agreed to the management plan. Patient is aware to call the clinic if they develop any new symptoms or if symptoms persist or worsen. Patient is aware when to return to the clinic for a follow-up visit. Patient educated on when it is appropriate to go to the emergency department.   Monia Pouch, FNP-C [facility] [phone]

## 2021-08-18 NOTE — Patient Instructions (Signed)
Here is a guide to help [place] find out which weight loss medications will be covered by your insurance plan.  Please check out this web site  [email or web address] and follow the 3 simple steps.   Wegovy or Saxenda  There is also a phone number you can call if you do not have access to the Internet. [phone] (Monday- Friday 8am-8pm)  [employer] provides coverage information for more than 80% of the inquiries submitted!!

## 2021-08-18 NOTE — Telephone Encounter (Signed)
Shannon Bentley KeyEliseo Squires - PA Case ID: [id number] - Rx #: [id number] Need help? Call us at [phone] Status Sent to [place] [facility] Betamethasone Dipropionate 0.05% cream Form [employer] Medicaid Electronic Prior Authorization Form (2017 NCPDP) Original Claim Info 99 PA Req, Dr submit ePA at [employer].comor MD Call [phone] NF-3 DS SUBMIT PA# 72 PA TYPE8

## 2021-08-19 ENCOUNTER — Ambulatory Visit: Payer: Self-pay | Admitting: Family Medicine

## 2021-08-19 LAB — CMP14+EGFR
ALT: 9 IU/L (ref 0–32)
AST: 12 IU/L (ref 0–40)
Albumin/Globulin Ratio: 1.1 — ABNORMAL LOW (ref 1.2–2.2)
Albumin: 3.9 g/dL (ref 3.9–5.0)
Alkaline Phosphatase: 88 IU/L (ref 44–121)
BUN/Creatinine Ratio: 10 (ref 9–23)
BUN: 8 mg/dL (ref 6–20)
Bilirubin Total: 0.4 mg/dL (ref 0.0–1.2)
CO2: 23 mmol/L (ref 20–29)
Calcium: 9.4 mg/dL (ref 8.7–10.2)
Chloride: 104 mmol/L (ref 96–106)
Creatinine, Ser: 0.77 mg/dL (ref 0.57–1.00)
Globulin, Total: 3.4 g/dL (ref 1.5–4.5)
Glucose: 97 mg/dL (ref 70–99)
Potassium: 4.4 mmol/L (ref 3.5–5.2)
Sodium: 140 mmol/L (ref 134–144)
Total Protein: 7.3 g/dL (ref 6.0–8.5)
eGFR: 106 mL/min/{1.73_m2} (ref >59)

## 2021-08-19 LAB — LIPID PANEL
Chol/HDL Ratio: 3.7 ratio (ref 0.0–4.4)
Cholesterol, Total: 169 mg/dL (ref 100–199)
HDL: 46 mg/dL (ref >39)
LDL Chol Calc (NIH): 99 mg/dL (ref 0–99)
Triglycerides: 135 mg/dL (ref 0–149)
VLDL Cholesterol Cal: 24 mg/dL (ref 5–40)

## 2021-08-19 LAB — MICROALBUMIN / CREATININE URINE RATIO
Creatinine, Urine: 326.9 mg/dL
Microalb/Creat Ratio: 13 mg/g creat (ref 0–29)
Microalbumin, Urine: 42.2 ug/mL

## 2021-08-19 LAB — CBC WITH DIFFERENTIAL/PLATELET
Basophils Absolute: 0 10*3/uL (ref 0.0–0.2)
Basos: 0 %
EOS (ABSOLUTE): 0.2 10*3/uL (ref 0.0–0.4)
Eos: 2 %
Hematocrit: 37.5 % (ref 34.0–46.6)
Hemoglobin: 12.2 g/dL (ref 11.1–15.9)
Immature Grans (Abs): 0 10*3/uL (ref 0.0–0.1)
Immature Granulocytes: 0 %
Lymphocytes Absolute: 2.1 10*3/uL (ref 0.7–3.1)
Lymphs: 23 %
MCH: 25.3 pg — ABNORMAL LOW (ref 26.6–33.0)
MCHC: 32.5 g/dL (ref 31.5–35.7)
MCV: 78 fL — ABNORMAL LOW (ref 79–97)
Monocytes Absolute: 0.6 10*3/uL (ref 0.1–0.9)
Monocytes: 7 %
Neutrophils Absolute: 6 10*3/uL (ref 1.4–7.0)
Neutrophils: 68 %
Platelets: 479 10*3/uL — ABNORMAL HIGH (ref 150–450)
RBC: 4.83 x10E6/uL (ref 3.77–5.28)
RDW: 13.1 % (ref 11.7–15.4)
WBC: 9 10*3/uL (ref 3.4–10.8)

## 2021-08-19 LAB — THYROID PANEL WITH TSH
Free Thyroxine Index: 2.3 (ref 1.2–4.9)
T3 Uptake Ratio: 25 % (ref 24–39)
T4, Total: 9.2 ug/dL (ref 4.5–12.0)
TSH: 1.64 u[IU]/mL (ref 0.450–4.500)

## 2021-08-19 LAB — VITAMIN D 25 HYDROXY (VIT D DEFICIENCY, FRACTURES): Vit D, 25-Hydroxy: 28 ng/mL — ABNORMAL LOW (ref 30.0–100.0)

## 2021-08-26 ENCOUNTER — Other Ambulatory Visit: Payer: PRIVATE HEALTH INSURANCE

## 2021-08-26 ENCOUNTER — Other Ambulatory Visit: Payer: Self-pay | Admitting: *Deleted

## 2021-08-26 ENCOUNTER — Telehealth: Payer: Self-pay | Admitting: Family Medicine

## 2021-08-26 DIAGNOSIS — R7989 Other specified abnormal findings of blood chemistry: Secondary | ICD-10-CM

## 2021-08-26 NOTE — Telephone Encounter (Signed)
CBC ordered as platelet count was elevated

## 2021-09-23 ENCOUNTER — Ambulatory Visit: Payer: PRIVATE HEALTH INSURANCE | Admitting: Family Medicine

## 2021-09-27 ENCOUNTER — Ambulatory Visit: Payer: PRIVATE HEALTH INSURANCE | Admitting: Family Medicine

## 2021-09-28 ENCOUNTER — Encounter: Payer: Self-pay | Admitting: Family Medicine

## 2021-11-18 ENCOUNTER — Encounter: Payer: Self-pay | Admitting: Family Medicine

## 2021-11-18 ENCOUNTER — Ambulatory Visit (INDEPENDENT_AMBULATORY_CARE_PROVIDER_SITE_OTHER): Payer: PRIVATE HEALTH INSURANCE | Admitting: Family Medicine

## 2021-11-18 VITALS — BP 114/74 | HR 95 | Temp 98.7°F | Ht 61.0 in | Wt 238.0 lb

## 2021-11-18 DIAGNOSIS — K5080 Crohn's disease of both small and large intestine without complications: Secondary | ICD-10-CM

## 2021-11-18 DIAGNOSIS — G5603 Carpal tunnel syndrome, bilateral upper limbs: Secondary | ICD-10-CM

## 2021-11-18 DIAGNOSIS — Z23 Encounter for immunization: Secondary | ICD-10-CM

## 2021-11-18 DIAGNOSIS — Z8632 Personal history of gestational diabetes: Secondary | ICD-10-CM

## 2021-11-18 LAB — BAYER DCA HB A1C WAIVED: HB A1C (BAYER DCA - WAIVED): 5.9 % — ABNORMAL HIGH (ref 4.8–5.6)

## 2021-11-18 MED ORDER — PREDNISONE 10 MG (21) PO TBPK: ORAL_TABLET | ORAL | 0 refills | Status: DC

## 2021-11-18 NOTE — Progress Notes (Signed)
Subjective:  Patient ID: Shannon Bentley, female    DOB: 1990-12-18, 31 y.o.   MRN: [id number]  Patient Care Team: Baruch Gouty, FNP as PCP - General (Family Medicine) Danie Binder, MD (Inactive) as Consulting Physician (Gastroenterology)   Chief Complaint:  Wrist Pain   HPI: Shannon Bentley is a 31 y.o. female presenting on 11/18/2021 for Wrist Pain   1. Carpal tunnel syndrome, bilateral Pt was seen and treated for bilateral carpal tunnel with bilateral braces and burst of steroids on 08/18/2021. She states the pain is still present at times but has improved slightly. She denies new injury. She would like to see ortho.   2. Crohn's disease of both small and large intestine without complication ([place]) Recent flare, has not followed up with GI as discussed. Was in the ED in July for significant constipation, states she is now having diarrhea. Reports abdominal cramping with the diarrhea. No hematochezia or melena.   3. History of gestational diabetes Denies polyuria, polyphagia, or polydipsia. Does not check blood sugars at home and reports diet has not been great over the last few weeks do to construction in her house. She reports eating a lot of fast foods.      Relevant past medical, surgical, family, and social history reviewed and updated as indicated.  Allergies and medications reviewed and updated. Data reviewed: Chart in Epic.   Past Medical History:  Diagnosis Date   Anemia    Crohn's disease ([place])    GERD (gastroesophageal reflux disease)    Sleep apnea     Past Surgical History:  Procedure Laterality Date   APPENDECTOMY     BIOPSY  07/03/2017   Procedure: BIOPSY;  Surgeon: Danie Binder, MD;  Location: [facility];  Service: Endoscopy;;  ileum random colon   COLONOSCOPY  07/2015   Dr. Britta Mccreedy: evidence of prior surgical anastomosis, multiple biopsies. 2 small ulcers at anastomosis. TI and remaining colon normal, without evidence of IBD. Path with acute  colitis with reactive changes, features of Crohn's not specifically identified. non-specific. differentials including infection, drug effects, self-limited colitis   COLONOSCOPY WITH PROPOFOL N/A 07/03/2017   Procedure: COLONOSCOPY WITH PROPOFOL;  Surgeon: Danie Binder, MD;  Location: [facility];  Service: Endoscopy;  Laterality: N/A;  8:30am   EXPLORATORY LAPAROTOMY  05/2015   Dr. 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 path with crohn's disease involving both ileum and cecum   TONSILLECTOMY     tonsils and adenoids      Social History   Socioeconomic History   Marital status: Single    Spouse name: Not on file   Number of children: Not on file   Years of education: Not on file   Highest education level: Not on file  Occupational History   Not on file  Tobacco Use   Smoking status: Former    Packs/day: 0.25    Years: 1.00    Total pack years: 0.25    Types: E-cigarettes, Cigarettes    Quit date: 06/28/2015    Years since quitting: 6.3   Smokeless tobacco: Never   Tobacco comments:    vaped in past  Vaping Use   Vaping Use: Former   Substances: Flavoring  Substance and Sexual Activity   Alcohol use: Not Currently    Comment: rare wine   Drug use: No   Sexual activity: Not Currently  Other Topics Concern  Not on file  Social History Narrative   WORKS AT [employer]. WANTS TO GO TO SCHOOL FOR NURSING NEXT SPRING.   Social Determinants of Health   Financial Resource Strain: Low Risk  (10/19/2020)   Overall Financial Resource Strain (CARDIA)    Difficulty of Paying Living Expenses: Not hard at all  Food Insecurity: No Food Insecurity (10/19/2020)   Hunger Vital Sign    Worried About Running Out of Food in the Last Year: Never true    Ran Out of Food in the Last Year: Never true  Transportation Needs: No Transportation Needs (10/19/2020)   PRAPARE - Armed forces logistics/support/administrative officer (Medical): No    Lack of Transportation (Non-Medical): No  Physical Activity: Insufficiently Active (10/19/2020)   Exercise Vital Sign    Days of Exercise per Week: 3 days    Minutes of Exercise per Session: 30 min  Stress: No Stress Concern Present (10/19/2020)   [place]    Feeling of Stress : Not at all  Social Connections: Moderately Integrated (10/19/2020)   Social Connection and Isolation Panel [NHANES]    Frequency of Communication with Friends and Family: More than three times a week    Frequency of Social Gatherings with Friends and Family: Three times a week    Attends Religious Services: 1 to 4 times per year    Active Member of Clubs or Organizations: Yes    Attends Archivist Meetings: 1 to 4 times per year    Marital Status: Never married  Intimate Partner Violence: Not At Risk (10/19/2020)   Humiliation, Afraid, Rape, and Kick questionnaire    Fear of Current or Ex-Partner: No    Emotionally Abused: No    Physically Abused: No    Sexually Abused: No    Outpatient Encounter Medications as of 11/18/2021  Medication Sig   Ascorbic Acid (VITAMIN C PO) Take by mouth.   CALCIUM PO Take by mouth.   Cholecalciferol (VITAMIN D3 PO) Take by mouth.   clobetasol cream (TEMOVATE) 8.33 % Apply 1 Application topically 2 (two) times daily.   ELDERBERRY PO Take by mouth.   Levocetirizine Dihydrochloride (XYZAL PO) Take by mouth.   Multiple Vitamin (MULTIVITAMIN) tablet Take 1 tablet by mouth daily.   Norethindrone-Ethinyl Estradiol-Fe Biphas (LO LOESTRIN FE) 1 MG-10 MCG / 10 MCG tablet Take 1 tablet by mouth daily. Take 1 daily by mouth   polyethylene glycol powder (GLYCOLAX/MIRALAX) 17 GM/SCOOP powder Take 17 g by mouth daily.   predniSONE (STERAPRED UNI-PAK 21 TAB) 10 MG (21) TBPK tablet As directed x 6 days   No facility-administered encounter medications on file as of 11/18/2021.     Allergies  Allergen Reactions   Amoxicillin-Pot Clavulanate Hives   Bactrim [Sulfamethoxazole-Trimethoprim] Hives   Benadryl [Diphenhydramine] Hives   Dilaudid [Hydromorphone Hcl] Hives    Review of Systems  Constitutional:  Negative for activity change, appetite change, chills, diaphoresis, fatigue, fever and unexpected weight change.  HENT: Negative.    Eyes: Negative.  Negative for photophobia and visual disturbance.  Respiratory:  Negative for cough, chest tightness and shortness of breath.   Cardiovascular:  Negative for chest pain, palpitations and leg swelling.  Gastrointestinal:  Positive for abdominal pain, constipation and diarrhea. Negative for anal bleeding, blood in stool, nausea, rectal pain and vomiting.  Endocrine: Negative.  Negative for polydipsia, polyphagia and polyuria.  Genitourinary:  Negative for decreased urine volume, difficulty urinating, dysuria, frequency and  urgency.  Musculoskeletal:  Positive for arthralgias. Negative for back pain, gait problem, joint swelling, myalgias, neck pain and neck stiffness.  Skin: Negative.   Allergic/Immunologic: Negative.   Neurological:  Negative for dizziness, tremors, seizures, syncope, facial asymmetry, speech difficulty, weakness, light-headedness, numbness and headaches.  Hematological: Negative.   Psychiatric/Behavioral:  Negative for agitation, confusion, hallucinations, sleep disturbance and suicidal ideas.   All other systems reviewed and are negative.       Objective:  BP 114/74   Pulse 95   Temp 98.7 F (37.1 C)   Ht 5' 1"  (1.549 m)   Wt 238 lb (108 kg)   SpO2 97%   BMI 44.97 kg/m    Wt Readings from Last 3 Encounters:  11/18/21 238 lb (108 kg)  08/18/21 251 lb (113.9 kg)  01/21/21 258 lb 8 oz (117.3 kg)    Physical Exam Vitals and nursing note reviewed.  Constitutional:      General: She is not in acute distress.    Appearance: Normal appearance. She is well-developed and well-groomed.  She is morbidly obese. She is not ill-appearing, toxic-appearing or diaphoretic.  HENT:     Head: Normocephalic and atraumatic.     Jaw: There is normal jaw occlusion.     Right Ear: Hearing normal.     Left Ear: Hearing normal.     Nose: Nose normal.     Mouth/Throat:     Lips: Pink.     Mouth: Mucous membranes are moist.     Pharynx: Oropharynx is clear. Uvula midline.  Eyes:     General: Lids are normal.     Extraocular Movements: Extraocular movements intact.     Conjunctiva/sclera: Conjunctivae normal.     Pupils: Pupils are equal, round, and reactive to light.  Neck:     Thyroid: No thyroid mass, thyromegaly or thyroid tenderness.     Vascular: No carotid bruit or JVD.     Trachea: Trachea and phonation normal.  Cardiovascular:     Rate and Rhythm: Normal rate and regular rhythm.     Chest Wall: PMI is not displaced.     Pulses: Normal pulses.     Heart sounds: Normal heart sounds. No murmur heard.    No friction rub. No gallop.  Pulmonary:     Effort: Pulmonary effort is normal. No respiratory distress.     Breath sounds: Normal breath sounds. No wheezing.  Abdominal:     General: Bowel sounds are normal. There is no distension or abdominal bruit.     Palpations: Abdomen is soft. There is no hepatomegaly, splenomegaly or mass.     Tenderness: There is no abdominal tenderness. There is no right CVA tenderness, left CVA tenderness, guarding or rebound.     Hernia: No hernia is present.  Musculoskeletal:     Right forearm: Normal.     Left forearm: Normal.     Right wrist: Tenderness present. No swelling, deformity, effusion, lacerations, bony tenderness, snuff box tenderness or crepitus. Normal range of motion. Normal pulse.     Left wrist: Tenderness present. No swelling, deformity, effusion, lacerations, bony tenderness, snuff box tenderness or crepitus. Normal range of motion. Normal pulse.     Right hand: Normal.     Left hand: Normal.     Cervical back: Normal range  of motion and neck supple.     Right lower leg: No edema.     Left lower leg: No edema.     Comments: Bilateral Wrists: positive  Phalen and Tinel signs. No deformity, crepitus, or swelling  Lymphadenopathy:     Cervical: No cervical adenopathy.  Skin:    General: Skin is warm and dry.     Capillary Refill: Capillary refill takes less than 2 seconds.     Coloration: Skin is not cyanotic.  Neurological:     General: No focal deficit present.     Mental Status: She is alert and oriented to person, place, and time.     Sensory: Sensation is intact.     Motor: Motor function is intact.     Coordination: Coordination is intact.     Gait: Gait is intact.     Deep Tendon Reflexes: Reflexes are normal and symmetric.  Psychiatric:        Attention and Perception: Attention and perception normal.        Mood and Affect: Mood and affect normal.        Speech: Speech normal.        Behavior: Behavior normal. Behavior is cooperative.        Thought Content: Thought content normal.        Cognition and Memory: Cognition and memory normal.        Judgment: Judgment normal.     Results for orders placed or performed in visit on 08/18/21  CBC with Differential/Platelet  Result Value Ref Range   WBC 9.0 3.4 - 10.8 x10E3/uL   RBC 4.83 3.77 - 5.28 x10E6/uL   Hemoglobin 12.2 11.1 - 15.9 g/dL   Hematocrit 37.5 34.0 - 46.6 %   MCV 78 (L) 79 - 97 fL   MCH 25.3 (L) 26.6 - 33.0 pg   MCHC 32.5 31.5 - 35.7 g/dL   RDW 13.1 11.7 - 15.4 %   Platelets 479 (H) 150 - 450 x10E3/uL   Neutrophils 68 Not Estab. %   Lymphs 23 Not Estab. %   Monocytes 7 Not Estab. %   Eos 2 Not Estab. %   Basos 0 Not Estab. %   Neutrophils Absolute 6.0 1.4 - 7.0 x10E3/uL   Lymphocytes Absolute 2.1 0.7 - 3.1 x10E3/uL   Monocytes Absolute 0.6 0.1 - 0.9 x10E3/uL   EOS (ABSOLUTE) 0.2 0.0 - 0.4 x10E3/uL   Basophils Absolute 0.0 0.0 - 0.2 x10E3/uL   Immature Granulocytes 0 Not Estab. %   Immature Grans (Abs) 0.0 0.0 - 0.1  x10E3/uL  CMP14+EGFR  Result Value Ref Range   Glucose 97 70 - 99 mg/dL   BUN 8 6 - 20 mg/dL   Creatinine, Ser 0.77 0.57 - 1.00 mg/dL   eGFR 106 >59 mL/min/1.73   BUN/Creatinine Ratio 10 9 - 23   Sodium 140 134 - 144 mmol/L   Potassium 4.4 3.5 - 5.2 mmol/L   Chloride 104 96 - 106 mmol/L   CO2 23 20 - 29 mmol/L   Calcium 9.4 8.7 - 10.2 mg/dL   Total Protein 7.3 6.0 - 8.5 g/dL   Albumin 3.9 3.9 - 5.0 g/dL   Globulin, Total 3.4 1.5 - 4.5 g/dL   Albumin/Globulin Ratio 1.1 (L) 1.2 - 2.2   Bilirubin Total 0.4 0.0 - 1.2 mg/dL   Alkaline Phosphatase 88 44 - 121 IU/L   AST 12 0 - 40 IU/L   ALT 9 0 - 32 IU/L  Bayer DCA Hb A1c Waived  Result Value Ref Range   HB A1C (BAYER DCA - WAIVED) 5.9 (H) 4.8 - 5.6 %  Microalbumin / creatinine urine ratio  Result Value Ref  Range   Creatinine, Urine 326.9 Not Estab. mg/dL   Microalbumin, Urine 42.2 Not Estab. ug/mL   Microalb/Creat Ratio 13 0 - 29 mg/g creat  Thyroid Panel With TSH  Result Value Ref Range   TSH 1.640 0.450 - 4.500 uIU/mL   T4, Total 9.2 4.5 - 12.0 ug/dL   T3 Uptake Ratio 25 24 - 39 %   Free Thyroxine Index 2.3 1.2 - 4.9  Lipid panel  Result Value Ref Range   Cholesterol, Total 169 100 - 199 mg/dL   Triglycerides 135 0 - 149 mg/dL   HDL 46 >39 mg/dL   VLDL Cholesterol Cal 24 5 - 40 mg/dL   LDL Chol Calc (NIH) 99 0 - 99 mg/dL   Chol/HDL Ratio 3.7 0.0 - 4.4 ratio  VITAMIN D 25 Hydroxy (Vit-D Deficiency, Fractures)  Result Value Ref Range   Vit D, 25-Hydroxy 28.0 (L) 30.0 - 100.0 ng/mL       Pertinent labs & imaging results that were available during my care of the patient were reviewed by me and considered in my medical decision making.  Assessment & Plan:  Chava was seen today for wrist pain.  Diagnoses and all orders for this visit:  Carpal tunnel syndrome, bilateral Continue to wear splints. Referral to ortho placed. Will burst with steroids. Symptomatic care discussed in detail.  -     predniSONE (STERAPRED  UNI-PAK 21 TAB) 10 MG (21) TBPK tablet; As directed x 6 days -     Ambulatory referral to Orthopedic Surgery  Crohn's disease of both small and large intestine without complication ([place]) Has not made follow up with GI as discussed, will place referral today.  -     Ambulatory referral to Gastroenterology  History of gestational diabetes A1C 5.9 today. Continue to diet and exercise.  -     Bayer DCA Hb A1c Waived  Need for immunization against influenza -     Flu Vaccine QUAD 18moIM (Fluarix, Fluzone & Alfiuria Quad PF)     Continue all other maintenance medications.  Follow up plan: Return in about 6 months (around 05/19/2022), or if symptoms worsen or fail to improve.   Continue healthy lifestyle choices, including diet (rich in fruits, vegetables, and lean proteins, and low in salt and simple carbohydrates) and exercise (at least 30 minutes of moderate physical activity daily).  Educational handout given for carpal tunnel   The above assessment and management plan was discussed with the patient. The patient verbalized understanding of and has agreed to the management plan. Patient is aware to call the clinic if they develop any new symptoms or if symptoms persist or worsen. Patient is aware when to return to the clinic for a follow-up visit. Patient educated on when it is appropriate to go to the emergency department.   MMonia Pouch FNP-C [facility] [phone]

## 2021-11-24 ENCOUNTER — Encounter: Payer: Self-pay | Admitting: *Deleted

## 2021-11-30 ENCOUNTER — Telehealth: Payer: Self-pay | Admitting: Family Medicine

## 2021-11-30 NOTE — Telephone Encounter (Signed)
Lmtcb.

## 2021-12-09 ENCOUNTER — Ambulatory Visit (INDEPENDENT_AMBULATORY_CARE_PROVIDER_SITE_OTHER): Payer: 59 | Admitting: Gastroenterology

## 2021-12-09 ENCOUNTER — Encounter: Payer: Self-pay | Admitting: Gastroenterology

## 2021-12-09 VITALS — BP 109/78 | HR 105 | Temp 99.6°F | Ht 61.0 in | Wt 235.2 lb

## 2021-12-09 DIAGNOSIS — K5080 Crohn's disease of both small and large intestine without complications: Secondary | ICD-10-CM | POA: Diagnosis not present

## 2021-12-09 DIAGNOSIS — R1031 Right lower quadrant pain: Secondary | ICD-10-CM | POA: Insufficient documentation

## 2021-12-09 NOTE — Progress Notes (Signed)
GI Office Note    Referring Provider: Baruch Gouty, FNP Primary Care Physician:  Baruch Gouty, FNP  Primary Gastroenterologist: previously Dr. Dimple Casey. [name], DO    Chief Complaint   Chief Complaint  Patient presents with   Abdominal Pain    RLQ pain that is severe at times. States that stool has a lot of mucous in it.     History of Present Illness   Shannon Bentley is a 31 y.o. female presenting today to re-establish care for Crohn's. She was last seen 11/2018.  History of exploratory laparoscopy converted to open laparotomy, appendectomy in April 2017 by Dr. Ladona Horns.  Found to have a large inflammatory mass at cecum, appendix unrecognizable and phlegmonous mass, right hemicolectomy performed with ileocolic anastomosis.  Path with Crohn's disease involving both ileum and cecum.  Establish care with Dr. Britta Mccreedy at that time.  Colonoscopy June 2017 with evidence of prior surgical anastomosis, multiple biopsies.  2 small ulcers at surgical anastomosis, TI and remaining colon normal, without evidence of IBD.  Path showed acute colitis with reactive changes.  Features of Crohn's disease nonspecifically identified.  Inflammatory changes were nonspecific.  Previously took Imuran 75 mg twice daily, made her "feel high".  She took some sort of IV medication at [facility] after that, which she states made her feel awful.  She is not sure the name of the medication.  Possibly Remicade.  Last colonoscopy May 2019: The recto-sigmoid colon, sigmoid colon, descending colon and transverse colon are normal. Biopsied. The rectum is normal. Biopsied.  External and internal hemorrhoids.  Biopsies with severely active chronic ileitis with cryptitis/no granulomas, random colon and rectal biopsies unremarkable.  Pentasa prescribed in June 2020 but she never started it because of the expense.  Patient states she has been doing okay since last seen.  She gave birth to her first child in  July 2022.  Very well during her pregnancy.  Several weeks ago she started having right-sided abdominal pain, intense in nature.  Started having diarrhea.  Mucus in the stool.  Sometimes pain is postprandial.  She has alternating constipation and diarrhea.  Takes Senokot every other day if needed.  Iron pill seems to constipate her.  Denies any melena.  She has bright red blood with constipation.  Believes this is from bleeding hemorrhoids.  Finding a lot of vegetables, drinks juice and water.  July 2023: White blood cell count 10,100, hemoglobin 11.4, MCV 79.2, platelets 421,000, potassium 3.4, sodium 139, creatinine 0.74, BUN 7, total bilirubin 0.3, alkaline phosphatase 85, AST 10, ALT 15, lipase 44,   Medications   Current Outpatient Medications  Medication Sig Dispense Refill   Ascorbic Acid (VITAMIN C PO) Take by mouth.     CALCIUM PO Take by mouth.     Cholecalciferol (VITAMIN D3 PO) Take by mouth.     clobetasol cream (TEMOVATE) 3.30 % Apply 1 Application topically 2 (two) times daily. 30 g 6   ELDERBERRY PO Take by mouth.     Levocetirizine Dihydrochloride (XYZAL PO) Take by mouth.     Multiple Vitamin (MULTIVITAMIN) tablet Take 1 tablet by mouth daily.     Norethindrone-Ethinyl Estradiol-Fe Biphas (LO LOESTRIN FE) 1 MG-10 MCG / 10 MCG tablet Take 1 tablet by mouth daily. Take 1 daily by mouth 28 tablet 11   No current facility-administered medications for this visit.    Allergies   Allergies as of 12/09/2021 - Review Complete 12/09/2021  Allergen Reaction  Noted   Amoxicillin-pot clavulanate Hives 10/29/2019   Bactrim [sulfamethoxazole-trimethoprim] Hives 05/02/2017   Benadryl [diphenhydramine] Hives 05/02/2017   Dilaudid [hydromorphone hcl] Hives 05/02/2017     Past Medical History   Past Medical History:  Diagnosis Date   Anemia    Crohn's disease ([place])    GERD (gastroesophageal reflux disease)    Sleep apnea     Past Surgical History   Past Surgical History:   Procedure Laterality Date   APPENDECTOMY     BIOPSY  07/03/2017   Procedure: BIOPSY;  Surgeon: Danie Binder, MD;  [facility];  Service: Endoscopy;;  ileum random colon   COLONOSCOPY  07/2015   Dr. Britta Mccreedy: evidence of prior surgical anastomosis, multiple biopsies. 2 small ulcers at anastomosis. TI and remaining colon normal, without evidence of IBD. Path with acute colitis with reactive changes, features of Crohn's not specifically identified. non-specific. differentials including infection, drug effects, self-limited colitis   COLONOSCOPY WITH PROPOFOL N/A 07/03/2017   Procedure: COLONOSCOPY WITH PROPOFOL;  Surgeon: Danie Binder, MD;  [facility];  Service: Endoscopy;  Laterality: N/A;  8:30am   EXPLORATORY LAPAROTOMY  05/2015   Dr. 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 path with crohn's disease involving both ileum and cecum   TONSILLECTOMY     tonsils and adenoids      Past Family History   Family History  Problem Relation Age of Onset   Heart attack Maternal Grandmother    Diabetes Father    Hypertension Father    Other Father        Covid   Diabetes Mother    Hypertension Mother    Fibromyalgia Sister    Migraines Sister    Other Daughter        seasonal allergies   Eczema Daughter    Colon cancer Neg Hx    Colon polyps Neg Hx    Inflammatory bowel disease Neg Hx     Past Social History   Social History   Socioeconomic History   Marital status: Single    Spouse name: Not on file   Number of children: Not on file   Years of education: Not on file   Highest education level: Not on file  Occupational History   Not on file  Tobacco Use   Smoking status: Former    Packs/day: 0.25    Years: 1.00    Total pack years: 0.25    Types: E-cigarettes, Cigarettes    Quit date: 06/28/2015    Years since quitting: 6.4   Smokeless tobacco:  Never   Tobacco comments:    vaped in past  Vaping Use   Vaping Use: Former   Substances: Flavoring  Substance and Sexual Activity   Alcohol use: Not Currently    Comment: rare wine   Drug use: No   Sexual activity: Not Currently  Other Topics Concern   Not on file  Social History Narrative   WORKS AT [employer]. WANTS TO GO TO SCHOOL FOR NURSING NEXT SPRING.   Social Determinants of Health   Financial Resource Strain: Low Risk  (10/19/2020)   Overall Financial Resource Strain (CARDIA)    Difficulty of Paying Living Expenses: Not hard at all  Food Insecurity: No Food Insecurity (10/19/2020)   Hunger Vital Sign    Worried About Running Out of Food in the Last Year: Never true    Ran Out of Food  in the Last Year: Never true  Transportation Needs: No Transportation Needs (10/19/2020)   PRAPARE - Hydrologist (Medical): No    Lack of Transportation (Non-Medical): No  Physical Activity: Insufficiently Active (10/19/2020)   Exercise Vital Sign    Days of Exercise per Week: 3 days    Minutes of Exercise per Session: 30 min  Stress: No Stress Concern Present (10/19/2020)   [place]    Feeling of Stress : Not at all  Social Connections: Moderately Integrated (10/19/2020)   Social Connection and Isolation Panel [NHANES]    Frequency of Communication with Friends and Family: More than three times a week    Frequency of Social Gatherings with Friends and Family: Three times a week    Attends Religious Services: 1 to 4 times per year    Active Member of Clubs or Organizations: Yes    Attends Archivist Meetings: 1 to 4 times per year    Marital Status: Never married  Intimate Partner Violence: Not At Risk (10/19/2020)   Humiliation, Afraid, Rape, and Kick questionnaire    Fear of Current or Ex-Partner: No    Emotionally Abused: No    Physically Abused: No    Sexually Abused: No     Review of Systems   General: Negative for anorexia, weight loss, fever, chills, fatigue, weakness. ENT: Negative for hoarseness, difficulty swallowing , nasal congestion. CV: Negative for chest pain, angina, palpitations, dyspnea on exertion, peripheral edema.  Respiratory: Negative for dyspnea at rest, dyspnea on exertion, cough, sputum, wheezing.  GI: See history of present illness. GU:  Negative for dysuria, hematuria, urinary incontinence, urinary frequency, nocturnal urination.  Endo: Negative for unusual weight change.     Physical Exam   BP 109/78 (BP Location: Right Arm, Patient Position: Sitting, Cuff Size: Large)   Pulse (!) 105   Temp 99.6 F (37.6 C) (Oral)   Ht 5' 1"  (1.549 m)   Wt 235 lb 3.2 oz (106.7 kg)   LMP  (LMP Unknown)   SpO2 100%   Breastfeeding No   BMI 44.44 kg/m    General: Well-nourished, well-developed in no acute distress.  Eyes: No icterus. Mouth: Oropharyngeal mucosa moist and pink , no lesions erythema or exudate. Lungs: Clear to auscultation bilaterally.  Heart: Regular rate and rhythm, no murmurs rubs or gallops.  Abdomen: Bowel sounds are normal, nontender, nondistended, no hepatosplenomegaly or masses,  no abdominal bruits or hernia , no rebound or guarding.  Rectal: not performed Extremities: No lower extremity edema. No clubbing or deformities. Neuro: Alert and oriented x 4   Skin: Warm and dry, no jaundice.   Psych: Alert and cooperative, normal mood and affect.  Labs   See hpi  Imaging Studies   No results found.  Assessment   Crohn's: Diagnosed with terminal surgery as outlined below.  Involving ileum/cecum.  Really has not been on long-term therapy, last colonoscopy in 2019 showing active disease.  She did not tolerate Imuran.  Cannot afford Pentasa.  Took IV medication (?Remicade) that she did not tolerate but unsure of name, made her feel bad.  Presenting with recurrent right lower quadrant abdominal pain, alternating  constipation/diarrhea, mucus in the stool.  Possibly dealing with Crohn's flare.  PLAN   C-Met, CBC, CRP, sed rate, fecal calprotectin   Laureen Ochs. Bobby Rumpf, [place], [facility]

## 2021-12-09 NOTE — Patient Instructions (Signed)
Please complete labs at [facility]. We will be in touch with results as available.

## 2021-12-12 ENCOUNTER — Telehealth: Payer: Self-pay

## 2021-12-12 NOTE — Telephone Encounter (Signed)
Transition Care Management Unsuccessful Follow-up Telephone Call  Date of discharge and from where:  12/11/21 [facility]    Attempts:  1st Attempt  Reason for unsuccessful TCM follow-up call:  Left voice message

## 2021-12-13 NOTE — Telephone Encounter (Signed)
Transition Care Management Follow-up Telephone Call Date of discharge and from where: 12/11/21 [facility]  How have you been since you were released from the hospital? Still having headache - pain 9/10 left eye and temple area feels like a sledge hammer hitting head  - little to no improvement - sensitive to light and sound - BP is normal Any questions or concerns? Yes - patient was given Robaxin and Mobic - patient would like to know if their is anything else she should do for pain    Items Reviewed: Did the pt receive and understand the discharge instructions provided? No patient states that very little was explained to her  Medications obtained and verified? Yes  Other? No  Any new allergies since your discharge? No  Dietary orders reviewed? No Do you have support at home? Yes   Home Care and Equipment/Supplies: Were home health services ordered? not applicable If so, what is the name of the agency? NA  Has the agency set up a time to come to the patient's home? not applicable Were any new equipment or medical supplies ordered?  No What is the name of the medical supply agency? NA Were you able to get the supplies/equipment? not applicable Do you have any questions related to the use of the equipment or supplies? No  Functional Questionnaire: (I = Independent and D = Dependent) ADLs: I  Bathing/Dressing- I  Meal Prep- I  Eating- I  Maintaining continence- I  Transferring/Ambulation- I  Managing Meds- I  Follow up appointments reviewed:  PCP Hospital f/u appt confirmed?  Patient declined appointment for today - she is going to eat and try to sleep hoping that will help   Scheduled to see [facility] on 12/14/2021 televisit (works best for the patient) [facility] f/u appt confirmed?  NA   Are transportation arrangements needed? No  If their condition worsens, is the pt aware to call PCP or go to the Emergency Dept.? Yes Was the patient provided with contact  information for the PCP's office or ED? Yes Was to pt encouraged to call back with questions or concerns? Yes

## 2021-12-14 ENCOUNTER — Encounter: Payer: Self-pay | Admitting: Family Medicine

## 2021-12-14 ENCOUNTER — Ambulatory Visit (INDEPENDENT_AMBULATORY_CARE_PROVIDER_SITE_OTHER): Payer: PRIVATE HEALTH INSURANCE | Admitting: Family Medicine

## 2021-12-14 ENCOUNTER — Ambulatory Visit: Payer: 59 | Admitting: Gastroenterology

## 2021-12-14 DIAGNOSIS — R519 Headache, unspecified: Secondary | ICD-10-CM

## 2021-12-14 NOTE — Progress Notes (Signed)
Virtual Visit via telephone Note Due to COVID-19 pandemic this visit was conducted virtually. This visit type was conducted due to national recommendations for restrictions regarding the COVID-19 Pandemic (e.g. social distancing, sheltering in place) in an effort to limit this patient's exposure and mitigate transmission in our community. All issues noted in this document were discussed and addressed.  A physical exam was not performed with this format.   I connected with Shannon Bentley on 12/14/2021 at 0920 by telephone and verified that I am speaking with the correct person using two identifiers. Shannon Bentley is currently located at home and mother is currently with them during visit. The provider, Monia Pouch, FNP is located in their office at time of visit.  I discussed the limitations, risks, security and privacy concerns of performing an evaluation and management service by virtual visit and the availability of in person appointments. I also discussed with the patient that there may be a patient responsible charge related to this service. The patient expressed understanding and agreed to proceed.  Subjective:  Patient ID: Shannon Bentley, female    DOB: Feb 21, 1990, 31 y.o.   MRN: [id number]  Chief Complaint:  Headache   HPI: Shannon Bentley is a 31 y.o. female presenting on 12/14/2021 for Headache   Pt presents today for follow up after recent ED visit for headache. She was treated with IV fluids and NSAID therapy. States the headache has improved greatly, still has some aching but much better. States she has been taking the meloxicam and tylenol with significant reduction of symptoms.      Relevant past medical, surgical, family, and social history reviewed and updated as indicated.  Allergies and medications reviewed and updated.   Past Medical History:  Diagnosis Date   Anemia    Crohn's disease ([place])    GERD (gastroesophageal reflux disease)    Sleep apnea     Past  Surgical History:  Procedure Laterality Date   APPENDECTOMY     BIOPSY  07/03/2017   Procedure: BIOPSY;  Surgeon: Danie Binder, MD;  [facility];  Service: Endoscopy;;  ileum random colon   COLONOSCOPY  07/2015   Dr. Britta Mccreedy: evidence of prior surgical anastomosis, multiple biopsies. 2 small ulcers at anastomosis. TI and remaining colon normal, without evidence of IBD. Path with acute colitis with reactive changes, features of Crohn's not specifically identified. non-specific. differentials including infection, drug effects, self-limited colitis   COLONOSCOPY WITH PROPOFOL N/A 07/03/2017   Procedure: COLONOSCOPY WITH PROPOFOL;  Surgeon: Danie Binder, MD;  [facility];  Service: Endoscopy;  Laterality: N/A;  8:30am   EXPLORATORY LAPAROTOMY  05/2015   Dr. 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 path with crohn's disease involving both ileum and cecum   TONSILLECTOMY     tonsils and adenoids      Social History   Socioeconomic History   Marital status: Single    Spouse name: Not on file   Number of children: Not on file   Years of education: Not on file   Highest education level: Not on file  Occupational History   Not on file  Tobacco Use   Smoking status: Former    Packs/day: 0.25    Years: 1.00    Total pack years: 0.25    Types: E-cigarettes, Cigarettes    Quit date: 06/28/2015    Years since quitting: 6.4   Smokeless tobacco: Never  Tobacco comments:    vaped in past  Vaping Use   Vaping Use: Former   Substances: Flavoring  Substance and Sexual Activity   Alcohol use: Not Currently    Comment: rare wine   Drug use: No   Sexual activity: Not Currently  Other Topics Concern   Not on file  Social History Narrative   [place]. WANTS TO GO TO SCHOOL FOR NURSING NEXT SPRING.   Social Determinants of Health   Financial Resource  Strain: Low Risk  (10/19/2020)   Overall Financial Resource Strain (CARDIA)    Difficulty of Paying Living Expenses: Not hard at all  Food Insecurity: No Food Insecurity (10/19/2020)   Hunger Vital Sign    Worried About Running Out of Food in the Last Year: Never true    Ran Out of Food in the Last Year: Never true  Transportation Needs: No Transportation Needs (10/19/2020)   PRAPARE - Hydrologist (Medical): No    Lack of Transportation (Non-Medical): No  Physical Activity: Insufficiently Active (10/19/2020)   Exercise Vital Sign    Days of Exercise per Week: 3 days    Minutes of Exercise per Session: 30 min  Stress: No Stress Concern Present (10/19/2020)   [place]    Feeling of Stress : Not at all  Social Connections: Moderately Integrated (10/19/2020)   Social Connection and Isolation Panel [NHANES]    Frequency of Communication with Friends and Family: More than three times a week    Frequency of Social Gatherings with Friends and Family: Three times a week    Attends Religious Services: 1 to 4 times per year    Active Member of Clubs or Organizations: Yes    Attends Archivist Meetings: 1 to 4 times per year    Marital Status: Never married  Intimate Partner Violence: Not At Risk (10/19/2020)   Humiliation, Afraid, Rape, and Kick questionnaire    Fear of Current or Ex-Partner: No    Emotionally Abused: No    Physically Abused: No    Sexually Abused: No    Outpatient Encounter Medications as of 12/14/2021  Medication Sig   Ascorbic Acid (VITAMIN C PO) Take by mouth.   CALCIUM PO Take by mouth.   Cholecalciferol (VITAMIN D3 PO) Take by mouth.   clobetasol cream (TEMOVATE) 2.68 % Apply 1 Application topically 2 (two) times daily.   ELDERBERRY PO Take by mouth.   Levocetirizine Dihydrochloride (XYZAL PO) Take by mouth.   meloxicam (MOBIC) 7.5 MG tablet Take 7.5 mg by mouth 2  (two) times daily.   methocarbamol (ROBAXIN) 500 MG tablet Take 500 mg by mouth 2 (two) times daily.   Multiple Vitamin (MULTIVITAMIN) tablet Take 1 tablet by mouth daily.   Norethindrone-Ethinyl Estradiol-Fe Biphas (LO LOESTRIN FE) 1 MG-10 MCG / 10 MCG tablet Take 1 tablet by mouth daily. Take 1 daily by mouth   No facility-administered encounter medications on file as of 12/14/2021.    Allergies  Allergen Reactions   Amoxicillin-Pot Clavulanate Hives   Bactrim [Sulfamethoxazole-Trimethoprim] Hives   Benadryl [Diphenhydramine] Hives   Dilaudid [Hydromorphone Hcl] Hives    Review of Systems  Constitutional:  Negative for activity change, appetite change, chills, diaphoresis, fatigue, fever and unexpected weight change.  Eyes:  Positive for photophobia. Negative for pain, discharge, redness, itching and visual disturbance.  Respiratory:  Negative for cough and shortness of breath.   Cardiovascular:  Negative  for chest pain, palpitations and leg swelling.  Gastrointestinal:  Positive for nausea. Negative for abdominal distention, abdominal pain, anal bleeding, blood in stool, constipation, diarrhea and rectal pain.  Genitourinary:  Negative for decreased urine volume and difficulty urinating.  Neurological:  Positive for headaches. Negative for tremors, seizures, syncope, facial asymmetry, speech difficulty, weakness, light-headedness and numbness.  Psychiatric/Behavioral:  Negative for confusion.   All other systems reviewed and are negative.        Observations/Objective: No vital signs or physical exam, this was a virtual health encounter.  Pt alert and oriented, answers all questions appropriately, and able to speak in full sentences.    Assessment and Plan: Diagnoses and all orders for this visit:  Aching headache Greatly improved since hospital visit. Advised to avoid triggers and stay adequately hydrated. Pt to keep a headache log over the next few weeks and bring to next  office visit. Report new, worsening, or persistent symptoms.     Follow Up Instructions: Return in about 3 months (around 03/16/2022), or if symptoms worsen or fail to improve.    I discussed the assessment and treatment plan with the patient. The patient was provided an opportunity to ask questions and all were answered. The patient agreed with the plan and demonstrated an understanding of the instructions.   The patient was advised to call back or seek an in-person evaluation if the symptoms worsen or if the condition fails to improve as anticipated.  The above assessment and management plan was discussed with the patient. The patient verbalized understanding of and has agreed to the management plan. Patient is aware to call the clinic if they develop any new symptoms or if symptoms persist or worsen. Patient is aware when to return to the clinic for a follow-up visit. Patient educated on when it is appropriate to go to the emergency department.    I provided 15 minutes of time during this telephone encounter.   Monia Pouch, FNP-C [facility] [address] [place], [place] [address] [phone] 12/14/2021

## 2021-12-22 ENCOUNTER — Encounter: Payer: Self-pay | Admitting: Orthopaedic Surgery

## 2021-12-22 ENCOUNTER — Ambulatory Visit (INDEPENDENT_AMBULATORY_CARE_PROVIDER_SITE_OTHER): Payer: 59 | Admitting: Orthopaedic Surgery

## 2021-12-22 VITALS — Ht 61.0 in | Wt 238.0 lb

## 2021-12-22 DIAGNOSIS — G5603 Carpal tunnel syndrome, bilateral upper limbs: Secondary | ICD-10-CM

## 2021-12-22 NOTE — Progress Notes (Signed)
Office Visit Note   Patient: Shannon Bentley           Date of Birth: Oct 21, 1990           MRN: [id number] Visit Date: 12/22/2021              Requested by: Baruch Gouty, [place],  [place] [id number] PCP: Baruch Gouty, FNP   Assessment & Plan: Visit Diagnoses:  1. Carpal tunnel syndrome, bilateral     Plan: Patient taking medications bracing with, topical cream with persistent symptoms.  We will proceed with electrical test to evaluate her bilateral carpal tunnel syndrome complaints in office follow-up after test to review.  Follow-Up Instructions: No follow-ups on file.   Orders:  No orders of the defined types were placed in this encounter.  No orders of the defined types were placed in this encounter.     Procedures: No procedures performed   Clinical Data: No additional findings.   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  Wakes her up at night she has to shake her hands she is used Voltaren, topical creams, Tiger balm wrist splints without relief.  She was told by her nurse practitioner she probably has bilateral carpal tunnel syndrome and is here for evaluation.  Review of Systems past history of Crohn's she has 79-year-old daughter.  No cervical spine problems.  All the systems noncontributory to HPI.   Objective: Vital Signs: Ht 5' 1"  (1.549 m)   Wt 238 lb (108 kg)   LMP  (LMP Unknown)   BMI 44.97 kg/m   Physical Exam Constitutional:      Appearance: She is well-developed.  HENT:     Head: Normocephalic.     Right Ear: External ear normal.     Left Ear: External ear normal. There is no impacted cerumen.  Eyes:     Pupils: Pupils are equal, round, and reactive to light.  Neck:     Thyroid: No thyromegaly.     Trachea: No tracheal deviation.  Cardiovascular:      Rate and Rhythm: Normal rate.  Pulmonary:     Effort: Pulmonary effort is normal.  Abdominal:     Palpations: Abdomen is soft.  Musculoskeletal:     Cervical back: No rigidity.  Skin:    General: Skin is warm and dry.  Neurological:     Mental Status: She is alert and oriented to person, place, and time.  Psychiatric:        Behavior: Behavior normal.     Ortho Exam bilateral positive Phalen's left worse than right positive carpal compression.  Bilateral mild thenar abductor weakness.  Ulnar sensation and resisted motor testing is normal.  Ulnar nerve at the elbow is normal.  No brachial plexus tenderness normal cervical range of motion.  Specialty Comments:  No specialty comments available.  Imaging: No results found.   PMFS History: Patient Active Problem List   Diagnosis Date Noted   Pain, abdominal, RLQ 12/09/2021   Crohn's disease of both small and large intestine without complication ([place]) 26/37/8588   Crohn's disease ([place]) 08/18/2021   Eczema 08/18/2021   Carpal tunnel syndrome, bilateral 08/18/2021   Abnormal Pap smear of cervix 05/17/2020   Left ovarian cyst 04/13/2020   Abnormal chromosomal and genetic  finding on antenatal screening mother 03/29/2020   History of gestational diabetes 03/23/2020   Vitamin D deficiency 05/02/2018   Morbid obesity ([place]) 05/02/2018   Past Medical History:  Diagnosis Date   Anemia    Crohn's disease ([place])    GERD (gastroesophageal reflux disease)    Sleep apnea     Family History  Problem Relation Age of Onset   Heart attack Maternal Grandmother    Diabetes Father    Hypertension Father    Other Father        Covid   Diabetes Mother    Hypertension Mother    Fibromyalgia Sister    Migraines Sister    Other Daughter        seasonal allergies   Eczema Daughter    Colon cancer Neg Hx    Colon polyps Neg Hx    Inflammatory bowel disease Neg Hx     Past Surgical History:  Procedure Laterality Date   APPENDECTOMY      BIOPSY  07/03/2017   Procedure: BIOPSY;  Surgeon: Danie Binder, MD;  [facility];  Service: Endoscopy;;  ileum random colon   COLONOSCOPY  07/2015   Dr. Britta Mccreedy: evidence of prior surgical anastomosis, multiple biopsies. 2 small ulcers at anastomosis. TI and remaining colon normal, without evidence of IBD. Path with acute colitis with reactive changes, features of Crohn's not specifically identified. non-specific. differentials including infection, drug effects, self-limited colitis   COLONOSCOPY WITH PROPOFOL N/A 07/03/2017   Procedure: COLONOSCOPY WITH PROPOFOL;  Surgeon: Danie Binder, MD;  [facility];  Service: Endoscopy;  Laterality: N/A;  8:30am   EXPLORATORY LAPAROTOMY  05/2015   Dr. 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 path with crohn's disease involving both ileum and cecum   TONSILLECTOMY     tonsils and adenoids     Social History   Occupational History   Not on file  Tobacco Use   Smoking status: Former    Packs/day: 0.25    Years: 1.00    Total pack years: 0.25    Types: E-cigarettes, Cigarettes    Quit date: 06/28/2015    Years since quitting: 6.4   Smokeless tobacco: Never   Tobacco comments:    vaped in past  Vaping Use   Vaping Use: Former   Substances: Flavoring  Substance and Sexual Activity   Alcohol use: Not Currently    Comment: rare wine   Drug use: No   Sexual activity: Not Currently

## 2022-01-23 ENCOUNTER — Telehealth: Payer: Self-pay | Admitting: Radiology

## 2022-01-23 DIAGNOSIS — G5603 Carpal tunnel syndrome, bilateral upper limbs: Secondary | ICD-10-CM

## 2022-01-23 NOTE — Telephone Encounter (Signed)
Spoke with patient and scheduled for 02/03/22

## 2022-01-23 NOTE — Telephone Encounter (Signed)
Shannon Bentley-could you see when you may be able to get patient in for study? Her referral was not placed at time of visit. Thanks.    Message from [place] office below. No referral was entered. Per chart note, proceed with EMG/NCS BUE for bilateral carpal tunnel syndrome.  Referral entered.   She called due to no one getting in touch with her about her EMG/NCV.  Her number is [phone].

## 2022-01-24 ENCOUNTER — Encounter: Payer: Self-pay | Admitting: Family Medicine

## 2022-01-24 ENCOUNTER — Ambulatory Visit (INDEPENDENT_AMBULATORY_CARE_PROVIDER_SITE_OTHER): Payer: PRIVATE HEALTH INSURANCE | Admitting: Family Medicine

## 2022-01-24 VITALS — BP 110/75 | HR 87 | Temp 98.9°F | Ht 61.0 in | Wt 231.0 lb

## 2022-01-24 DIAGNOSIS — W57XXXA Bitten or stung by nonvenomous insect and other nonvenomous arthropods, initial encounter: Secondary | ICD-10-CM

## 2022-01-24 DIAGNOSIS — M2540 Effusion, unspecified joint: Secondary | ICD-10-CM | POA: Diagnosis not present

## 2022-01-24 DIAGNOSIS — E559 Vitamin D deficiency, unspecified: Secondary | ICD-10-CM | POA: Diagnosis not present

## 2022-01-24 DIAGNOSIS — M2550 Pain in unspecified joint: Secondary | ICD-10-CM

## 2022-01-24 DIAGNOSIS — D649 Anemia, unspecified: Secondary | ICD-10-CM

## 2022-01-24 DIAGNOSIS — R600 Localized edema: Secondary | ICD-10-CM

## 2022-01-24 DIAGNOSIS — S30860A Insect bite (nonvenomous) of lower back and pelvis, initial encounter: Secondary | ICD-10-CM

## 2022-01-24 NOTE — Progress Notes (Signed)
Subjective:  Patient ID: Shannon Bentley, female    DOB: May 12, 1990, 31 y.o.   MRN: [id number]  Patient Care Team: Baruch Gouty, FNP as PCP - General (Family Medicine) Danie Binder, MD (Inactive) as Consulting Physician (Gastroenterology)   Chief Complaint:  Edema (Finger and toes /X1 week)   HPI: Shannon Bentley is a 31 y.o. female presenting on 01/24/2022 for Edema (Finger and toes /X1 week)   Pt presents today for evaluation of ongoing finger and toe swelling with joint pain and stiffness. States this has been intermittent for several months. She has been wearing her compression stockings without relief of symptoms. States her fingers and toes do hurt with the swelling. Has tried fish oil without relief of symptoms. She states she did have a tick bite over the summer to her lower back. Did have a rash after but was not treated for the bite. She denies chest pain, leg swelling, PND, orthopnea, palpitations, fatigue, weakness, confusion, or syncope.     Relevant past medical, surgical, family, and social history reviewed and updated as indicated.  Allergies and medications reviewed and updated. Data reviewed: Chart in Epic.   Past Medical History:  Diagnosis Date   Anemia    Crohn's disease ([place])    GERD (gastroesophageal reflux disease)    Sleep apnea     Past Surgical History:  Procedure Laterality Date   APPENDECTOMY     BIOPSY  07/03/2017   Procedure: BIOPSY;  Surgeon: Danie Binder, MD;  [facility];  Service: Endoscopy;;  ileum random colon   COLONOSCOPY  07/2015   Dr. Britta Mccreedy: evidence of prior surgical anastomosis, multiple biopsies. 2 small ulcers at anastomosis. TI and remaining colon normal, without evidence of IBD. Path with acute colitis with reactive changes, features of Crohn's not specifically identified. non-specific. differentials including infection, drug effects, self-limited colitis   COLONOSCOPY WITH PROPOFOL N/A 07/03/2017   Procedure:  COLONOSCOPY WITH PROPOFOL;  Surgeon: Danie Binder, MD;  [facility];  Service: Endoscopy;  Laterality: N/A;  8:30am   EXPLORATORY LAPAROTOMY  05/2015   Dr. 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 path with crohn's disease involving both ileum and cecum   TONSILLECTOMY     tonsils and adenoids      Social History   Socioeconomic History   Marital status: Single    Spouse name: Not on file   Number of children: Not on file   Years of education: Not on file   Highest education level: Not on file  Occupational History   Not on file  Tobacco Use   Smoking status: Former    Packs/day: 0.25    Years: 1.00    Total pack years: 0.25    Types: E-cigarettes, Cigarettes    Quit date: 06/28/2015    Years since quitting: 6.5   Smokeless tobacco: Never   Tobacco comments:    vaped in past  Vaping Use   Vaping Use: Former   Substances: Flavoring  Substance and Sexual Activity   Alcohol use: Not Currently    Comment: rare wine   Drug use: No   Sexual activity: Not Currently  Other Topics Concern   Not on file  Social History Narrative   WORKS AT [employer]. WANTS TO GO TO SCHOOL FOR NURSING NEXT SPRING.   Social Determinants of Health   Financial Resource Strain: Low Risk  (10/19/2020)  Overall Financial Resource Strain (CARDIA)    Difficulty of Paying Living Expenses: Not hard at all  Food Insecurity: No Food Insecurity (10/19/2020)   Hunger Vital Sign    Worried About Running Out of Food in the Last Year: Never true    Ran Out of Food in the Last Year: Never true  Transportation Needs: No Transportation Needs (10/19/2020)   PRAPARE - Hydrologist (Medical): No    Lack of Transportation (Non-Medical): No  Physical Activity: Insufficiently Active (10/19/2020)   Exercise Vital Sign    Days of Exercise per Week: 3 days    Minutes of Exercise  per Session: 30 min  Stress: No Stress Concern Present (10/19/2020)   [place]    Feeling of Stress : Not at all  Social Connections: Moderately Integrated (10/19/2020)   Social Connection and Isolation Panel [NHANES]    Frequency of Communication with Friends and Family: More than three times a week    Frequency of Social Gatherings with Friends and Family: Three times a week    Attends Religious Services: 1 to 4 times per year    Active Member of Clubs or Organizations: Yes    Attends Archivist Meetings: 1 to 4 times per year    Marital Status: Never married  Intimate Partner Violence: Not At Risk (10/19/2020)   Humiliation, Afraid, Rape, and Kick questionnaire    Fear of Current or Ex-Partner: No    Emotionally Abused: No    Physically Abused: No    Sexually Abused: No    Outpatient Encounter Medications as of 01/24/2022  Medication Sig   Ascorbic Acid (VITAMIN C PO) Take by mouth.   CALCIUM PO Take by mouth.   Cholecalciferol (VITAMIN D3 PO) Take by mouth.   clobetasol cream (TEMOVATE) 0.62 % Apply 1 Application topically 2 (two) times daily.   ELDERBERRY PO Take by mouth.   Levocetirizine Dihydrochloride (XYZAL PO) Take by mouth.   meloxicam (MOBIC) 7.5 MG tablet Take 7.5 mg by mouth 2 (two) times daily.   methocarbamol (ROBAXIN) 500 MG tablet Take 500 mg by mouth 2 (two) times daily.   Multiple Vitamin (MULTIVITAMIN) tablet Take 1 tablet by mouth daily.   Norethindrone-Ethinyl Estradiol-Fe Biphas (LO LOESTRIN FE) 1 MG-10 MCG / 10 MCG tablet Take 1 tablet by mouth daily. Take 1 daily by mouth   No facility-administered encounter medications on file as of 01/24/2022.    Allergies  Allergen Reactions   Amoxicillin-Pot Clavulanate Hives   Bactrim [Sulfamethoxazole-Trimethoprim] Hives   Benadryl [Diphenhydramine] Hives   Dilaudid [Hydromorphone Hcl] Hives    Review of Systems  Constitutional:   Negative for activity change, appetite change, chills, diaphoresis, fatigue, fever and unexpected weight change.  HENT: Negative.    Eyes: Negative.  Negative for photophobia and visual disturbance.  Respiratory:  Negative for cough, chest tightness and shortness of breath.   Cardiovascular:  Positive for leg swelling. Negative for chest pain and palpitations.  Gastrointestinal:  Negative for abdominal pain, blood in stool, constipation, diarrhea, nausea and vomiting.  Endocrine: Negative.   Genitourinary:  Negative for decreased urine volume, difficulty urinating, dysuria, frequency and urgency.  Musculoskeletal:  Positive for arthralgias and joint swelling. Negative for back pain, gait problem, myalgias, neck pain and neck stiffness.  Skin: Negative.   Allergic/Immunologic: Negative.   Neurological:  Negative for dizziness, tremors, seizures, syncope, facial asymmetry, speech difficulty, weakness, light-headedness, numbness and headaches.  Hematological: Negative.   Psychiatric/Behavioral:  Negative for agitation, confusion, hallucinations, sleep disturbance and suicidal ideas.   All other systems reviewed and are negative.       Objective:  BP 110/75   Pulse 87   Temp 98.9 F (37.2 C)   Ht _0  (1.549 m)   Wt 231 lb (104.8 kg)   SpO2 100%   BMI 43.65 kg/m    Wt Readings from Last 3 Encounters:  01/24/22 231 lb (104.8 kg)  12/22/21 238 lb (108 kg)  12/09/21 235 lb 3.2 oz (106.7 kg)    Physical Exam Vitals and nursing note reviewed.  Constitutional:      General: She is not in acute distress.    Appearance: Normal appearance. She is well-developed and well-groomed. She is morbidly obese. She is not ill-appearing, toxic-appearing or diaphoretic.  HENT:     Head: Normocephalic and atraumatic.     Jaw: There is normal jaw occlusion.     Right Ear: Hearing normal.     Left Ear: Hearing normal.     Nose: Nose normal.     Mouth/Throat:     Lips: Pink.     Mouth: Mucous  membranes are moist.     Pharynx: Oropharynx is clear. Uvula midline.  Eyes:     General: Lids are normal.     Extraocular Movements: Extraocular movements intact.     Conjunctiva/sclera: Conjunctivae normal.     Pupils: Pupils are equal, round, and reactive to light.  Neck:     Thyroid: No thyroid mass, thyromegaly or thyroid tenderness.     Vascular: No carotid bruit or JVD.     Trachea: Trachea and phonation normal.  Cardiovascular:     Rate and Rhythm: Normal rate and regular rhythm.     Chest Wall: PMI is not displaced.     Pulses: Normal pulses.     Heart sounds: Normal heart sounds. No murmur heard.    No friction rub. No gallop.     Comments: Swelling to several toes, no ankle or lower leg swelling Pulmonary:     Effort: Pulmonary effort is normal. No respiratory distress.     Breath sounds: Normal breath sounds. No wheezing.  Abdominal:     General: Bowel sounds are normal. There is no distension or abdominal bruit.     Palpations: Abdomen is soft. There is no hepatomegaly or splenomegaly.     Tenderness: There is no abdominal tenderness. There is no right CVA tenderness or left CVA tenderness.     Hernia: No hernia is present.  Musculoskeletal:        General: Normal range of motion.     Cervical back: Normal range of motion and neck supple.     Right lower leg: No edema.     Left lower leg: No edema.       Feet:     Comments: Swelling to left middle finger. No tenderness or bruising.   Feet:     Comments: Swelling to toes as indicated Lymphadenopathy:     Cervical: No cervical adenopathy.  Skin:    General: Skin is warm and dry.     Capillary Refill: Capillary refill takes less than 2 seconds.     Coloration: Skin is not cyanotic, jaundiced or pale.     Findings: No rash.  Neurological:     General: No focal deficit present.     Mental Status: She is alert and oriented to person, place, and time.  Sensory: Sensation is intact.     Motor: Motor function is  intact.     Coordination: Coordination is intact.     Gait: Gait is intact.     Deep Tendon Reflexes: Reflexes are normal and symmetric.  Psychiatric:        Attention and Perception: Attention and perception normal.        Mood and Affect: Mood and affect normal.        Speech: Speech normal.        Behavior: Behavior normal. Behavior is cooperative.        Thought Content: Thought content normal.        Cognition and Memory: Cognition and memory normal.        Judgment: Judgment normal.     Results for orders placed or performed in visit on 11/18/21  Bayer DCA Hb A1c Waived  Result Value Ref Range   HB A1C (BAYER DCA - WAIVED) 5.9 (H) 4.8 - 5.6 %       Pertinent labs & imaging results that were available during my care of the patient were reviewed by me and considered in my medical decision making.  Assessment & Plan:  Shannon Bentley was seen today for edema.  Diagnoses and all orders for this visit:  Multiple joint pain Localized edema Joint swelling No indications of acute heart failure. No reported injuries. Will check below for potential underlying causes. Limit sodium intake and report new or worsening symptoms. Further treatment pending results.  -     CMP14+EGFR -     Brain natriuretic peptide -     Rocky mtn spotted fvr abs pnl(IgG+IgM) -     Lyme Disease Serology w/Reflex -     ANA Comprehensive Panel -     CBC with Differential/Platelet -     VITAMIN D 25 Hydroxy (Vit-D Deficiency, Fractures)  Vitamin D deficiency Labs pending. Continue repletion therapy. If indicated, will change repletion dosage. Eat foods rich in Vit D including milk, orange juice, yogurt with vitamin D added, salmon or mackerel, canned tuna fish, cereals with vitamin D added, and cod liver oil. Get out in the sun but make sure to wear at least SPF 30 sunscreen.  -     VITAMIN D 25 Hydroxy (Vit-D Deficiency, Fractures)  Tick bite of lower back, initial encounter Will check for below and treat if  warranted.  -     Rocky mtn spotted fvr abs pnl(IgG+IgM) -     Lyme Disease Serology w/Reflex     Continue all other maintenance medications.  Follow up plan: Return in about 3 months (around 04/25/2022), or if symptoms worsen or fail to improve, for chronic follow up.   Continue healthy lifestyle choices, including diet (rich in fruits, vegetables, and lean proteins, and low in salt and simple carbohydrates) and exercise (at least 30 minutes of moderate physical activity daily).  Educational handout given for edema  The above assessment and management plan was discussed with the patient. The patient verbalized understanding of and has agreed to the management plan. Patient is aware to call the clinic if they develop any new symptoms or if symptoms persist or worsen. Patient is aware when to return to the clinic for a follow-up visit. Patient educated on when it is appropriate to go to the emergency department.   Monia Pouch, FNP-C [facility] [phone]

## 2022-01-25 ENCOUNTER — Ambulatory Visit: Payer: PRIVATE HEALTH INSURANCE | Admitting: Family Medicine

## 2022-01-25 LAB — CMP14+EGFR
ALT: 7 IU/L (ref 0–32)
AST: 9 IU/L (ref 0–40)
Albumin/Globulin Ratio: 1.1 — ABNORMAL LOW (ref 1.2–2.2)
Albumin: 3.7 g/dL — ABNORMAL LOW (ref 3.9–4.9)
Alkaline Phosphatase: 91 IU/L (ref 44–121)
BUN/Creatinine Ratio: 14 (ref 9–23)
BUN: 8 mg/dL (ref 6–20)
Bilirubin Total: <0.2 mg/dL (ref 0.0–1.2)
CO2: 22 mmol/L (ref 20–29)
Calcium: 9.3 mg/dL (ref 8.7–10.2)
Chloride: 103 mmol/L (ref 96–106)
Creatinine, Ser: 0.59 mg/dL (ref 0.57–1.00)
Globulin, Total: 3.5 g/dL (ref 1.5–4.5)
Glucose: 96 mg/dL (ref 70–99)
Potassium: 4.3 mmol/L (ref 3.5–5.2)
Sodium: 139 mmol/L (ref 134–144)
Total Protein: 7.2 g/dL (ref 6.0–8.5)
eGFR: 123 mL/min/{1.73_m2} (ref >59)

## 2022-01-25 LAB — CBC WITH DIFFERENTIAL/PLATELET
Basophils Absolute: 0 10*3/uL (ref 0.0–0.2)
Basos: 0 %
EOS (ABSOLUTE): 0.4 10*3/uL (ref 0.0–0.4)
Eos: 4 %
Hematocrit: 35.9 % (ref 34.0–46.6)
Hemoglobin: 10.9 g/dL — ABNORMAL LOW (ref 11.1–15.9)
Immature Grans (Abs): 0 10*3/uL (ref 0.0–0.1)
Immature Granulocytes: 0 %
Lymphocytes Absolute: 2.1 10*3/uL (ref 0.7–3.1)
Lymphs: 23 %
MCH: 23.3 pg — ABNORMAL LOW (ref 26.6–33.0)
MCHC: 30.4 g/dL — ABNORMAL LOW (ref 31.5–35.7)
MCV: 77 fL — ABNORMAL LOW (ref 79–97)
Monocytes Absolute: 0.7 10*3/uL (ref 0.1–0.9)
Monocytes: 8 %
Neutrophils Absolute: 5.9 10*3/uL (ref 1.4–7.0)
Neutrophils: 65 %
Platelets: 484 10*3/uL — ABNORMAL HIGH (ref 150–450)
RBC: 4.68 x10E6/uL (ref 3.77–5.28)
RDW: 15.1 % (ref 11.7–15.4)
WBC: 9.1 10*3/uL (ref 3.4–10.8)

## 2022-01-25 LAB — LYME DISEASE SEROLOGY W/REFLEX: Lyme Total Antibody EIA: NEGATIVE

## 2022-01-25 LAB — ANA COMPREHENSIVE PANEL
Anti JO-1: <0.2 AI (ref 0.0–0.9)
Centromere Ab Screen: <0.2 AI (ref 0.0–0.9)
Chromatin Ab SerPl-aCnc: <0.2 AI (ref 0.0–0.9)
ENA RNP Ab: <0.2 AI (ref 0.0–0.9)
ENA SM Ab Ser-aCnc: <0.2 AI (ref 0.0–0.9)
ENA SSA (RO) Ab: <0.2 AI (ref 0.0–0.9)
ENA SSB (LA) Ab: <0.2 AI (ref 0.0–0.9)
Scleroderma (Scl-70) (ENA) Antibody, IgG: <0.2 AI (ref 0.0–0.9)
dsDNA Ab: <1 IU/mL (ref 0–9)

## 2022-01-25 LAB — VITAMIN D 25 HYDROXY (VIT D DEFICIENCY, FRACTURES): Vit D, 25-Hydroxy: 30.8 ng/mL (ref 30.0–100.0)

## 2022-01-25 LAB — ROCKY MTN SPOTTED FVR ABS PNL(IGG+IGM)
RMSF IgG: NEGATIVE
RMSF IgM: 0.22 index (ref 0.00–0.89)

## 2022-01-25 LAB — BRAIN NATRIURETIC PEPTIDE: BNP: <2.5 pg/mL (ref 0.0–100.0)

## 2022-01-30 NOTE — Addendum Note (Signed)
Addended by: Nigel Berthold C on: 01/30/2022 09:10 AM   Modules accepted: Orders

## 2022-02-03 ENCOUNTER — Encounter: Payer: 59 | Admitting: Physical Medicine and Rehabilitation

## 2022-02-10 ENCOUNTER — Other Ambulatory Visit: Payer: Self-pay | Admitting: Adult Health

## 2022-02-23 ENCOUNTER — Telehealth: Payer: PRIVATE HEALTH INSURANCE | Admitting: Family Medicine

## 2022-02-24 ENCOUNTER — Ambulatory Visit (INDEPENDENT_AMBULATORY_CARE_PROVIDER_SITE_OTHER): Payer: Self-pay | Admitting: Family Medicine

## 2022-02-24 ENCOUNTER — Encounter: Payer: Self-pay | Admitting: Family Medicine

## 2022-02-24 VITALS — BP 110/76 | HR 103 | Temp 97.5°F | Ht 61.0 in | Wt 226.0 lb

## 2022-02-24 DIAGNOSIS — J069 Acute upper respiratory infection, unspecified: Secondary | ICD-10-CM

## 2022-02-24 LAB — VERITOR FLU A/B WAIVED
Influenza A: NEGATIVE
Influenza B: NEGATIVE

## 2022-02-24 MED ORDER — CHLORPHEN-PE-ACETAMINOPHEN 4-10-325 MG PO TABS: 1.0000 | ORAL_TABLET | Freq: Four times a day (QID) | ORAL | 0 refills | Status: DC | PRN

## 2022-02-24 MED ORDER — PREDNISONE 20 MG PO TABS: 40.0000 mg | ORAL_TABLET | Freq: Every day | ORAL | 0 refills | Status: AC

## 2022-02-24 NOTE — Progress Notes (Signed)
Subjective:  Patient ID: Shannon Bentley, female    DOB: October 28, 1990, 32 y.o.   MRN: [id number]  Patient Care Team: Baruch Gouty, FNP as PCP - General (Family Medicine) Danie Binder, MD (Inactive) as Consulting Physician (Gastroenterology)   Chief Complaint:  Nasal Congestion, Cough, and facial pressure (X 2 days)   HPI: Shannon Bentley is a 32 y.o. female presenting on 02/24/2022 for Nasal Congestion, Cough, and facial pressure (X 2 days)   Cough This is a new problem. Episode onset: 2 days ago. The problem has been waxing and waning. The problem occurs every few minutes. The cough is Non-productive. Associated symptoms include ear congestion, headaches, nasal congestion, postnasal drip and rhinorrhea. Pertinent negatives include no chest pain, chills, ear pain, fever, heartburn, hemoptysis, myalgias, rash, sore throat, shortness of breath, sweats, weight loss or wheezing. The symptoms are aggravated by dust and fumes. She has tried OTC cough suppressant for the symptoms. The treatment provided mild relief.    Relevant past medical, surgical, family, and social history reviewed and updated as indicated.  Allergies and medications reviewed and updated. Data reviewed: Chart in Epic.   Past Medical History:  Diagnosis Date   Anemia    Crohn's disease ([place])    GERD (gastroesophageal reflux disease)    Sleep apnea     Past Surgical History:  Procedure Laterality Date   APPENDECTOMY     BIOPSY  07/03/2017   Procedure: BIOPSY;  Surgeon: Danie Binder, MD;  Location: [facility];  Service: Endoscopy;;  ileum random colon   COLONOSCOPY  07/2015   Dr. Britta Mccreedy: evidence of prior surgical anastomosis, multiple biopsies. 2 small ulcers at anastomosis. TI and remaining colon normal, without evidence of IBD. Path with acute colitis with reactive changes, features of Crohn's not specifically identified. non-specific. differentials including infection, drug effects, self-limited colitis    COLONOSCOPY WITH PROPOFOL N/A 07/03/2017   Procedure: COLONOSCOPY WITH PROPOFOL;  Surgeon: Danie Binder, MD;  Location: [facility];  Service: Endoscopy;  Laterality: N/A;  8:30am   EXPLORATORY LAPAROTOMY  05/2015   Dr. 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 path with crohn's disease involving both ileum and cecum   TONSILLECTOMY     tonsils and adenoids      Social History   Socioeconomic History   Marital status: Single    Spouse name: Not on file   Number of children: Not on file   Years of education: Not on file   Highest education level: Not on file  Occupational History   Not on file  Tobacco Use   Smoking status: Former    Packs/day: 0.25    Years: 1.00    Total pack years: 0.25    Types: E-cigarettes, Cigarettes    Quit date: 06/28/2015    Years since quitting: 6.6   Smokeless tobacco: Never   Tobacco comments:    vaped in past  Vaping Use   Vaping Use: Former   Substances: Flavoring  Substance and Sexual Activity   Alcohol use: Not Currently    Comment: rare wine   Drug use: No   Sexual activity: Not Currently  Other Topics Concern   Not on file  Social History Narrative   WORKS AT [employer]. WANTS TO GO TO SCHOOL FOR NURSING NEXT SPRING.   Social Determinants of Health   Financial Resource Strain: Low Risk  (10/19/2020)   Overall Emergency planning/management officer  Strain (CARDIA)    Difficulty of Paying Living Expenses: Not hard at all  Food Insecurity: No Food Insecurity (10/19/2020)   Hunger Vital Sign    Worried About Running Out of Food in the Last Year: Never true    Ran Out of Food in the Last Year: Never true  Transportation Needs: No Transportation Needs (10/19/2020)   PRAPARE - Hydrologist (Medical): No    Lack of Transportation (Non-Medical): No  Physical Activity: Insufficiently Active (10/19/2020)   Exercise Vital Sign     Days of Exercise per Week: 3 days    Minutes of Exercise per Session: 30 min  Stress: No Stress Concern Present (10/19/2020)   [place]    Feeling of Stress : Not at all  Social Connections: Moderately Integrated (10/19/2020)   Social Connection and Isolation Panel [NHANES]    Frequency of Communication with Friends and Family: More than three times a week    Frequency of Social Gatherings with Friends and Family: Three times a week    Attends Religious Services: 1 to 4 times per year    Active Member of Clubs or Organizations: Yes    Attends Archivist Meetings: 1 to 4 times per year    Marital Status: Never married  Intimate Partner Violence: Not At Risk (10/19/2020)   Humiliation, Afraid, Rape, and Kick questionnaire    Fear of Current or Ex-Partner: No    Emotionally Abused: No    Physically Abused: No    Sexually Abused: No    Outpatient Encounter Medications as of 02/24/2022  Medication Sig   Ascorbic Acid (VITAMIN C PO) Take by mouth.   CALCIUM PO Take by mouth.   Chlorphen-PE-Acetaminophen 4-10-325 MG TABS Take 1 tablet by mouth every 6 (six) hours as needed.   Cholecalciferol (VITAMIN D3 PO) Take by mouth.   clobetasol cream (TEMOVATE) 2.83 % Apply 1 Application topically 2 (two) times daily.   ELDERBERRY PO Take by mouth.   Levocetirizine Dihydrochloride (XYZAL PO) Take by mouth.   meloxicam (MOBIC) 7.5 MG tablet Take 7.5 mg by mouth 2 (two) times daily.   methocarbamol (ROBAXIN) 500 MG tablet Take 500 mg by mouth 2 (two) times daily.   Multiple Vitamin (MULTIVITAMIN) tablet Take 1 tablet by mouth daily.   Norethindrone-Ethinyl Estradiol-Fe Biphas (LO LOESTRIN FE) 1 MG-10 MCG / 10 MCG tablet TAKE 1 TABLET BY MOUTH ONCE DAILY   predniSONE (DELTASONE) 20 MG tablet Take 2 tablets (40 mg total) by mouth daily with breakfast for 5 days. 2 po daily for 5 days   No facility-administered encounter  medications on file as of 02/24/2022.    Allergies  Allergen Reactions   Amoxicillin-Pot Clavulanate Hives   Bactrim [Sulfamethoxazole-Trimethoprim] Hives   Benadryl [Diphenhydramine] Hives   Dilaudid [Hydromorphone Hcl] Hives    Review of Systems  Constitutional:  Negative for activity change, appetite change, chills, diaphoresis, fatigue, fever, unexpected weight change and weight loss.  HENT:  Positive for congestion, postnasal drip, rhinorrhea, sinus pressure and sinus pain. Negative for dental problem, drooling, ear discharge, ear pain, facial swelling, hearing loss, mouth sores, nosebleeds, sneezing, sore throat, tinnitus, trouble swallowing and voice change.   Eyes:  Negative for photophobia and visual disturbance.  Respiratory:  Positive for cough. Negative for apnea, hemoptysis, choking, chest tightness, shortness of breath, wheezing and stridor.   Cardiovascular:  Negative for chest pain, palpitations and leg swelling.  Gastrointestinal:  Negative for abdominal pain and heartburn.  Genitourinary:  Negative for decreased urine volume.  Musculoskeletal:  Negative for myalgias.  Skin:  Negative for rash.  Neurological:  Positive for headaches. Negative for tremors, seizures, syncope, facial asymmetry, speech difficulty, weakness, light-headedness and numbness.  Psychiatric/Behavioral:  Negative for confusion.   All other systems reviewed and are negative.       Objective:  BP 110/76   Pulse (!) 103   Temp (!) 97.5 F (36.4 C) (Temporal)   Ht 5\' 1"  (1.549 m)   Wt 226 lb (102.5 kg)   SpO2 100%   BMI 42.70 kg/m    Wt Readings from Last 3 Encounters:  02/24/22 226 lb (102.5 kg)  01/24/22 231 lb (104.8 kg)  12/22/21 238 lb (108 kg)    Physical Exam Vitals and nursing note reviewed.  Constitutional:      General: She is not in acute distress.    Appearance: Normal appearance. She is well-developed and well-groomed. She is obese. She is not ill-appearing,  toxic-appearing or diaphoretic.  HENT:     Head: Normocephalic and atraumatic.     Jaw: There is normal jaw occlusion.     Right Ear: Hearing normal. A middle ear effusion is present. Tympanic membrane is not erythematous.     Left Ear: Hearing normal. A middle ear effusion is present. Tympanic membrane is not erythematous.     Nose: Congestion present.     Right Turbinates: Enlarged.     Left Turbinates: Enlarged.     Right Sinus: Maxillary sinus tenderness and frontal sinus tenderness present.     Left Sinus: Maxillary sinus tenderness and frontal sinus tenderness present.     Mouth/Throat:     Lips: Pink.     Mouth: Mucous membranes are moist.     Pharynx: Oropharynx is clear. Uvula midline.  Eyes:     General: Lids are normal.     Extraocular Movements: Extraocular movements intact.     Conjunctiva/sclera: Conjunctivae normal.     Pupils: Pupils are equal, round, and reactive to light.  Neck:     Thyroid: No thyroid mass, thyromegaly or thyroid tenderness.     Vascular: No carotid bruit or JVD.     Trachea: Trachea and phonation normal.  Cardiovascular:     Rate and Rhythm: Normal rate and regular rhythm.     Chest Wall: PMI is not displaced.     Pulses: Normal pulses.     Heart sounds: Normal heart sounds. No murmur heard.    No friction rub. No gallop.  Pulmonary:     Effort: Pulmonary effort is normal. No respiratory distress.     Breath sounds: Normal breath sounds. No wheezing, rhonchi or rales.  Abdominal:     General: Bowel sounds are normal. There is no distension or abdominal bruit.     Palpations: Abdomen is soft. There is no hepatomegaly or splenomegaly.     Tenderness: There is no abdominal tenderness. There is no right CVA tenderness or left CVA tenderness.     Hernia: No hernia is present.  Musculoskeletal:        General: Normal range of motion.     Cervical back: Normal range of motion and neck supple.     Right lower leg: No edema.     Left lower leg: No  edema.  Lymphadenopathy:     Cervical: No cervical adenopathy.  Skin:    General: Skin is warm and dry.  Capillary Refill: Capillary refill takes less than 2 seconds.     Coloration: Skin is not cyanotic, jaundiced or pale.     Findings: No rash.  Neurological:     General: No focal deficit present.     Mental Status: She is alert and oriented to person, place, and time.     Sensory: Sensation is intact.     Motor: Motor function is intact.     Coordination: Coordination is intact.     Gait: Gait is intact.     Deep Tendon Reflexes: Reflexes are normal and symmetric.  Psychiatric:        Attention and Perception: Attention and perception normal.        Mood and Affect: Mood and affect normal.        Speech: Speech normal.        Behavior: Behavior normal. Behavior is cooperative.        Thought Content: Thought content normal.        Cognition and Memory: Cognition and memory normal.        Judgment: Judgment normal.     Results for orders placed or performed in visit on 01/24/22  CMP14+EGFR  Result Value Ref Range   Glucose 96 70 - 99 mg/dL   BUN 8 6 - 20 mg/dL   Creatinine, Ser 0.59 0.57 - 1.00 mg/dL   eGFR 123 >59 mL/min/1.73   BUN/Creatinine Ratio 14 9 - 23   Sodium 139 134 - 144 mmol/L   Potassium 4.3 3.5 - 5.2 mmol/L   Chloride 103 96 - 106 mmol/L   CO2 22 20 - 29 mmol/L   Calcium 9.3 8.7 - 10.2 mg/dL   Total Protein 7.2 6.0 - 8.5 g/dL   Albumin 3.7 (L) 3.9 - 4.9 g/dL   Globulin, Total 3.5 1.5 - 4.5 g/dL   Albumin/Globulin Ratio 1.1 (L) 1.2 - 2.2   Bilirubin Total <0.2 0.0 - 1.2 mg/dL   Alkaline Phosphatase 91 44 - 121 IU/L   AST 9 0 - 40 IU/L   ALT 7 0 - 32 IU/L  Brain natriuretic peptide  Result Value Ref Range   BNP <2.5 0.0 - 100.0 pg/mL  Rocky mtn spotted fvr abs pnl(IgG+IgM)  Result Value Ref Range   RMSF IgG Negative Negative   RMSF IgM 0.22 0.00 - 0.89 index  Lyme Disease Serology w/Reflex  Result Value Ref Range   Lyme Total Antibody EIA  Negative Negative  ANA Comprehensive Panel  Result Value Ref Range   dsDNA Ab <1 0 - 9 IU/mL   ENA RNP Ab <0.2 0.0 - 0.9 AI   ENA SM Ab Ser-aCnc <0.2 0.0 - 0.9 AI   Scleroderma (Scl-70) (ENA) Antibody, IgG <0.2 0.0 - 0.9 AI   ENA SSA (RO) Ab <0.2 0.0 - 0.9 AI   ENA SSB (LA) Ab <0.2 0.0 - 0.9 AI   Chromatin Ab SerPl-aCnc <0.2 0.0 - 0.9 AI   Anti JO-1 <0.2 0.0 - 0.9 AI   Centromere Ab Screen <0.2 0.0 - 0.9 AI   See below: Comment   CBC with Differential/Platelet  Result Value Ref Range   WBC 9.1 3.4 - 10.8 x10E3/uL   RBC 4.68 3.77 - 5.28 x10E6/uL   Hemoglobin 10.9 (L) 11.1 - 15.9 g/dL   Hematocrit 35.9 34.0 - 46.6 %   MCV 77 (L) 79 - 97 fL   MCH 23.3 (L) 26.6 - 33.0 pg   MCHC 30.4 (L) 31.5 - 35.7 g/dL  RDW 15.1 11.7 - 15.4 %   Platelets 484 (H) 150 - 450 x10E3/uL   Neutrophils 65 Not Estab. %   Lymphs 23 Not Estab. %   Monocytes 8 Not Estab. %   Eos 4 Not Estab. %   Basos 0 Not Estab. %   Neutrophils Absolute 5.9 1.4 - 7.0 x10E3/uL   Lymphocytes Absolute 2.1 0.7 - 3.1 x10E3/uL   Monocytes Absolute 0.7 0.1 - 0.9 x10E3/uL   EOS (ABSOLUTE) 0.4 0.0 - 0.4 x10E3/uL   Basophils Absolute 0.0 0.0 - 0.2 x10E3/uL   Immature Granulocytes 0 Not Estab. %   Immature Grans (Abs) 0.0 0.0 - 0.1 x10E3/uL  VITAMIN D 25 Hydroxy (Vit-D Deficiency, Fractures)  Result Value Ref Range   Vit D, 25-Hydroxy 30.8 30.0 - 100.0 ng/mL       Pertinent labs & imaging results that were available during my care of the patient were reviewed by me and considered in my medical decision making.  Assessment & Plan:  [place] was seen today for nasal congestion, cough and facial pressure.  Diagnoses and all orders for this visit:  URI with cough and congestion Influenza negative in office. COVID and RSV pending. No indications of acute bacterial infection. Symptomatic care discussed in detail. Will add prednisone and Norel for symptom relief. Pt aware to report new, worsening, or persistent symptoms.  -      COVID-19, Flu A+B and RSV -     Veritor Flu A/B Waived -     predniSONE (DELTASONE) 20 MG tablet; Take 2 tablets (40 mg total) by mouth daily with breakfast for 5 days. 2 po daily for 5 days -     Chlorphen-PE-Acetaminophen 4-10-325 MG TABS; Take 1 tablet by mouth every 6 (six) hours as needed.     Continue all other maintenance medications.  Follow up plan: Return if symptoms worsen or fail to improve.   Continue healthy lifestyle choices, including diet (rich in fruits, vegetables, and lean proteins, and low in salt and simple carbohydrates) and exercise (at least 30 minutes of moderate physical activity daily).  Educational handout given for URI  The above assessment and management plan was discussed with the patient. The patient verbalized understanding of and has agreed to the management plan. Patient is aware to call the clinic if they develop any new symptoms or if symptoms persist or worsen. Patient is aware when to return to the clinic for a follow-up visit. Patient educated on when it is appropriate to go to the emergency department.   Monia Pouch, FNP-C [facility] [phone]

## 2022-02-25 LAB — COVID-19, FLU A+B AND RSV
Influenza A, NAA: NOT DETECTED
Influenza B, NAA: NOT DETECTED
RSV, NAA: NOT DETECTED
SARS-CoV-2, NAA: NOT DETECTED

## 2022-03-09 ENCOUNTER — Telehealth: Payer: 59 | Admitting: Physician Assistant

## 2022-03-09 ENCOUNTER — Encounter: Payer: Self-pay | Admitting: Physician Assistant

## 2022-03-09 DIAGNOSIS — H9201 Otalgia, right ear: Secondary | ICD-10-CM | POA: Diagnosis not present

## 2022-03-09 DIAGNOSIS — R3989 Other symptoms and signs involving the genitourinary system: Secondary | ICD-10-CM | POA: Diagnosis not present

## 2022-03-09 MED ORDER — FLUTICASONE PROPIONATE 50 MCG/ACT NA SUSP: 2.0000 | Freq: Every day | NASAL | 0 refills | Status: DC

## 2022-03-09 MED ORDER — NITROFURANTOIN MONOHYD MACRO 100 MG PO CAPS: 100.0000 mg | ORAL_CAPSULE | Freq: Two times a day (BID) | ORAL | 0 refills | Status: DC

## 2022-03-09 MED ORDER — CIPROFLOXACIN-DEXAMETHASONE 0.3-0.1 % OT SUSP: 4.0000 [drp] | Freq: Two times a day (BID) | OTIC | 0 refills | Status: DC

## 2022-03-09 NOTE — Patient Instructions (Signed)
Babette Relic, thank you for joining Mar Daring, PA-C for today's virtual visit.  While this provider is not your primary care provider (PCP), if your PCP is located in our provider database this encounter information will be shared with them immediately following your visit.   [place] [facility] account gives you access to today's visit and all your visits, tests, and labs performed at [facility] " click here if you don't have a [facility] account or go to [email or web address]  Consent: (Patient) Rees Matura provided verbal consent for this virtual visit at the beginning of the encounter.  Current Medications:  Current Outpatient Medications:    ciprofloxacin-dexamethasone (CIPRODEX) OTIC suspension, Place 4 drops into the right ear 2 (two) times daily. X 7 days, Disp: 7.5 mL, Rfl: 0   fluticasone (FLONASE) 50 MCG/ACT nasal spray, Place 2 sprays into both nostrils daily., Disp: 16 g, Rfl: 0   nitrofurantoin, macrocrystal-monohydrate, (MACROBID) 100 MG capsule, Take 1 capsule (100 mg total) by mouth 2 (two) times daily., Disp: 10 capsule, Rfl: 0   Ascorbic Acid (VITAMIN C PO), Take by mouth., Disp: , Rfl:    CALCIUM PO, Take by mouth., Disp: , Rfl:    Chlorphen-PE-Acetaminophen 4-10-325 MG TABS, Take 1 tablet by mouth every 6 (six) hours as needed., Disp: 60 tablet, Rfl: 0   Cholecalciferol (VITAMIN D3 PO), Take by mouth., Disp: , Rfl:    clobetasol cream (TEMOVATE) 2.70 %, Apply 1 Application topically 2 (two) times daily., Disp: 30 g, Rfl: 6   ELDERBERRY PO, Take by mouth., Disp: , Rfl:    Levocetirizine Dihydrochloride (XYZAL PO), Take by mouth., Disp: , Rfl:    meloxicam (MOBIC) 7.5 MG tablet, Take 7.5 mg by mouth 2 (two) times daily., Disp: , Rfl:    methocarbamol (ROBAXIN) 500 MG tablet, Take 500 mg by mouth 2 (two) times daily., Disp: , Rfl:    Multiple Vitamin (MULTIVITAMIN) tablet, Take 1 tablet by mouth daily., Disp: , Rfl:     Norethindrone-Ethinyl Estradiol-Fe Biphas (LO LOESTRIN FE) 1 MG-10 MCG / 10 MCG tablet, TAKE 1 TABLET BY MOUTH ONCE DAILY, Disp: 28 tablet, Rfl: 6   Medications ordered in this encounter:  Meds ordered this encounter  Medications   fluticasone (FLONASE) 50 MCG/ACT nasal spray    Sig: Place 2 sprays into both nostrils daily.    Dispense:  16 g    Refill:  0    Order Specific Question:   Supervising Provider    Answer:   Chase Picket [id number]   ciprofloxacin-dexamethasone (CIPRODEX) OTIC suspension    Sig: Place 4 drops into the right ear 2 (two) times daily. X 7 days    Dispense:  7.5 mL    Refill:  0    Order Specific Question:   Supervising Provider    Answer:   Chase Picket [[id number]]   nitrofurantoin, macrocrystal-monohydrate, (MACROBID) 100 MG capsule    Sig: Take 1 capsule (100 mg total) by mouth 2 (two) times daily.    Dispense:  10 capsule    Refill:  0    Order Specific Question:   Supervising Provider    Answer:   Chase Picket [id number]     *If you need refills on other medications prior to your next appointment, please contact your pharmacy*  Follow-Up: Call back or seek an in-person evaluation if the symptoms worsen or if the condition fails to improve as anticipated.  [place] [phone]  Other Instructions  Urinary Tract Infection, Adult  A urinary tract infection (UTI) is an infection of any part of the urinary tract. The urinary tract includes the kidneys, ureters, bladder, and urethra. These organs make, store, and get rid of urine in the body. An upper UTI affects the ureters and kidneys. A lower UTI affects the bladder and urethra. What are the causes? Most urinary tract infections are caused by bacteria in your genital area around your urethra, where urine leaves your body. These bacteria grow and cause inflammation of your urinary tract. What increases the risk? You are more likely to develop this condition if: You have  a urinary catheter that stays in place. You are not able to control when you urinate or have a bowel movement (incontinence). You are female and you: Use a spermicide or diaphragm for birth control. Have low estrogen levels. Are pregnant. You have certain genes that increase your risk. You are sexually active. You take antibiotic medicines. You have a condition that causes your flow of urine to slow down, such as: An enlarged prostate, if you are female. Blockage in your urethra. A kidney stone. A nerve condition that affects your bladder control (neurogenic bladder). Not getting enough to drink, or not urinating often. You have certain medical conditions, such as: Diabetes. A weak disease-fighting system (immunesystem). Sickle cell disease. Gout. Spinal cord injury. What are the signs or symptoms? Symptoms of this condition include: Needing to urinate right away (urgency). Frequent urination. This may include small amounts of urine each time you urinate. Pain or burning with urination. Blood in the urine. Urine that smells bad or unusual. Trouble urinating. Cloudy urine. Vaginal discharge, if you are female. Pain in the abdomen or the lower back. You may also have: Vomiting or a decreased appetite. Confusion. Irritability or tiredness. A fever or chills. Diarrhea. The first symptom in older adults may be confusion. In some cases, they may not have any symptoms until the infection has worsened. How is this diagnosed? This condition is diagnosed based on your medical history and a physical exam. You may also have other tests, including: Urine tests. Blood tests. Tests for STIs (sexually transmitted infections). If you have had more than one UTI, a cystoscopy or imaging studies may be done to determine the cause of the infections. How is this treated? Treatment for this condition includes: Antibiotic medicine. Over-the-counter medicines to treat discomfort. Drinking  enough water to stay hydrated. If you have frequent infections or have other conditions such as a kidney stone, you may need to see a health care provider who specializes in the urinary tract (urologist). In rare cases, urinary tract infections can cause sepsis. Sepsis is a life-threatening condition that occurs when the body responds to an infection. Sepsis is treated in the hospital with IV antibiotics, fluids, and other medicines. Follow these instructions at home:  Medicines Take over-the-counter and prescription medicines only as told by your health care provider. If you were prescribed an antibiotic medicine, take it as told by your health care provider. Do not stop using the antibiotic even if you start to feel better. General instructions Make sure you: Empty your bladder often and completely. Do not hold urine for long periods of time. Empty your bladder after sex. Wipe from front to back after urinating or having a bowel movement if you are female. Use each tissue only one time when you wipe. Drink enough fluid to keep your urine pale yellow. Keep all follow-up visits. This is  important. Contact a health care provider if: Your symptoms do not get better after 1-2 days. Your symptoms go away and then return. Get help right away if: You have severe pain in your back or your lower abdomen. You have a fever or chills. You have nausea or vomiting. Summary A urinary tract infection (UTI) is an infection of any part of the urinary tract, which includes the kidneys, ureters, bladder, and urethra. Most urinary tract infections are caused by bacteria in your genital area. Treatment for this condition often includes antibiotic medicines. If you were prescribed an antibiotic medicine, take it as told by your health care provider. Do not stop using the antibiotic even if you start to feel better. Keep all follow-up visits. This is important. This information is not intended to replace advice  given to you by your health care provider. Make sure you discuss any questions you have with your health care provider. Document Revised: 09/19/2019 Document Reviewed: 09/19/2019 Elsevier Patient Education  2023 Elsevier Inc.   Eustachian Tube Dysfunction  Eustachian tube dysfunction refers to a condition in which a blockage develops in the narrow passage that connects the middle ear to the back of the nose (eustachian tube). The eustachian tube regulates air pressure in the middle ear by letting air move between the ear and nose. It also helps to drain fluid from the middle ear space. Eustachian tube dysfunction can affect one or both ears. When the eustachian tube does not function properly, air pressure, fluid, or both can build up in the middle ear. What are the causes? This condition occurs when the eustachian tube becomes blocked or cannot open normally. Common causes of this condition include: Ear infections. Colds and other infections that affect the nose, mouth, and throat (upper respiratory tract). Allergies. Irritation from cigarette smoke. Irritation from stomach acid coming up into the esophagus (gastroesophageal reflux). The esophagus is the part of the body that moves food from the mouth to the stomach. Sudden changes in air pressure, such as from descending in an airplane or scuba diving. Abnormal growths in the nose or throat, such as: Growths that line the nose (nasal polyps). Abnormal growth of cells (tumors). Enlarged tissue at the back of the throat (adenoids). What increases the risk? You are more likely to develop this condition if: You smoke. You are overweight. You are a child who has: Certain birth defects of the mouth, such as cleft palate. Large tonsils or adenoids. What are the signs or symptoms? Common symptoms of this condition include: A feeling of fullness in the ear. Ear pain. Clicking or popping noises in the ear. Ringing in the ear  (tinnitus). Hearing loss. Loss of balance. Dizziness. Symptoms may get worse when the air pressure around you changes, such as when you travel to an area of high elevation, fly on an airplane, or go scuba diving. How is this diagnosed? This condition may be diagnosed based on: Your symptoms. A physical exam of your ears, nose, and throat. Tests, such as those that measure: The movement of your eardrum. Your hearing (audiometry). How is this treated? Treatment depends on the cause and severity of your condition. In mild cases, you may relieve your symptoms by moving air into your ears. This is called "popping the ears." In more severe cases, or if you have symptoms of fluid in your ears, treatment may include: Medicines to relieve congestion (decongestants). Medicines that treat allergies (antihistamines). Nasal sprays or ear drops that contain medicines that reduce swelling (  steroids). A procedure to drain the fluid in your eardrum. In this procedure, a small tube may be placed in the eardrum to: Drain the fluid. Restore the air in the middle ear space. A procedure to insert a balloon device through the nose to inflate the opening of the eustachian tube (balloon dilation). Follow these instructions at home: Lifestyle Do not do any of the following until your health care provider approves: Travel to high altitudes. Fly in airplanes. Work in a Estate agent or room. Scuba dive. Do not use any products that contain nicotine or tobacco. These products include cigarettes, chewing tobacco, and vaping devices, such as e-cigarettes. If you need help quitting, ask your health care provider. Keep your ears dry. Wear fitted earplugs during showering and bathing. Dry your ears completely after. General instructions Take over-the-counter and prescription medicines only as told by your health care provider. Use techniques to help pop your ears as recommended by your health care provider.  These may include: Chewing gum. Yawning. Frequent, forceful swallowing. Closing your mouth, holding your nose closed, and gently blowing as if you are trying to blow air out of your nose. Keep all follow-up visits. This is important. Contact a health care provider if: Your symptoms do not go away after treatment. Your symptoms come back after treatment. You are unable to pop your ears. You have: A fever. Pain in your ear. Pain in your head or neck. Fluid draining from your ear. Your hearing suddenly changes. You become very dizzy. You lose your balance. Get help right away if: You have a sudden, severe increase in any of your symptoms. Summary Eustachian tube dysfunction refers to a condition in which a blockage develops in the eustachian tube. It can be caused by ear infections, allergies, inhaled irritants, or abnormal growths in the nose or throat. Symptoms may include ear pain or fullness, hearing loss, or ringing in the ears. Mild cases are treated with techniques to unblock the ears, such as yawning or chewing gum. More severe cases are treated with medicines or procedures. This information is not intended to replace advice given to you by your health care provider. Make sure you discuss any questions you have with your health care provider. Document Revised: 04/19/2020 Document Reviewed: 04/19/2020 Elsevier Patient Education  2023 Elsevier Inc.    If you have been instructed to have an in-person evaluation today at a local Urgent Care facility, please use the link below. It will take you to a list of all of our available [facility], including address, phone number and hours of operation. Please do not delay care.  [facility]  If you or a family member do not have a primary care provider, use the link below to schedule a visit and establish care. When you choose a [facility] primary care physician or advanced practice provider, you gain a  long-term partner in health. Find a Primary Care Provider  Learn more about [facility]'s in-office and virtual care options: [facility] - Get Care Now

## 2022-03-09 NOTE — Progress Notes (Signed)
Virtual Visit Consent   Shannon Bentley, you are scheduled for a virtual visit with a [facility] provider today. Just as with appointments in the office, your consent must be obtained to participate. Your consent will be active for this visit and any virtual visit you may have with one of our providers in the next 365 days. If you have a MyChart account, a copy of this consent can be sent to you electronically.  As this is a virtual visit, video technology does not allow for your provider to perform a traditional examination. This may limit your provider's ability to fully assess your condition. If your provider identifies any concerns that need to be evaluated in person or the need to arrange testing (such as labs, EKG, etc.), we will make arrangements to do so. Although advances in technology are sophisticated, we cannot ensure that it will always work on either your end or our end. If the connection with a video visit is poor, the visit may have to be switched to a telephone visit. With either a video or telephone visit, we are not always able to ensure that we have a secure connection.  By engaging in this virtual visit, you consent to the provision of healthcare and authorize for your insurance to be billed (if applicable) for the services provided during this visit. Depending on your insurance coverage, you may receive a charge related to this service.  I need to obtain your verbal consent now. Are you willing to proceed with your visit today? Shannon Bentley has provided verbal consent on 03/09/2022 for a virtual visit (video or telephone). [name], PA-C  Date: 03/09/2022 8:15 AM  Virtual Visit via Video Note   I, [name], connected with  Shannon Bentley  ([id number], 1990/06/12) on 03/09/22 at  8:00 AM EST by a video-enabled telemedicine application and verified that I am speaking with the correct person using two identifiers.  Location: Patient: Virtual Visit Location  Patient: Home Provider: Virtual Visit Location Provider: Home Office   I discussed the limitations of evaluation and management by telemedicine and the availability of in person appointments. The patient expressed understanding and agreed to proceed.    History of Present Illness: Shannon Bentley is a 32 y.o. who identifies as a female who was assigned female at birth, and is being seen today for ear pain/tooth pain and UTI symptoms.  HPI: Urinary Tract Infection  This is a new problem. The current episode started yesterday. The problem occurs every urination. The problem has been unchanged. The quality of the pain is described as burning. The pain is mild. There has been no fever. Associated symptoms include frequency, hesitancy and urgency. Pertinent negatives include no chills, discharge, flank pain, nausea or vomiting.  Otalgia  There is pain in the right ear. This is a new problem. The current episode started yesterday (2 weeks ago was prescribed prednisone and alka seltzer cold and flu). The problem occurs constantly. The problem has been unchanged. There has been no fever. Associated symptoms include headaches. Pertinent negatives include no coughing, diarrhea, ear discharge, hearing loss, rhinorrhea, sore throat or vomiting. Associated symptoms comments: Feels like full of water in right ear; popping in ear. She has tried acetaminophen (alka seltzer cold and flu; prednisone) for the symptoms. The treatment provided no relief. Her past medical history is significant for a chronic ear infection (when a child) and a tympanostomy tube (yes as a child). There is no history of hearing loss.  Problems:  Patient Active Problem List   Diagnosis Date Noted   Crohn's disease of both small and large intestine without complication ([place]) 81/82/9937   Crohn's disease ([place]) 08/18/2021   Eczema 08/18/2021   Carpal tunnel syndrome, bilateral 08/18/2021   Abnormal Pap smear of cervix 05/17/2020    Left ovarian cyst 04/13/2020   Abnormal chromosomal and genetic finding on antenatal screening mother 03/29/2020   History of gestational diabetes 03/23/2020   Vitamin D deficiency 05/02/2018   Morbid obesity ([place]) 05/02/2018    Allergies:  Allergies  Allergen Reactions   Amoxicillin-Pot Clavulanate Hives   Bactrim [Sulfamethoxazole-Trimethoprim] Hives   Benadryl [Diphenhydramine] Hives   Dilaudid [Hydromorphone Hcl] Hives   Medications:  Current Outpatient Medications:    ciprofloxacin-dexamethasone (CIPRODEX) OTIC suspension, Place 4 drops into the right ear 2 (two) times daily. X 7 days, Disp: 7.5 mL, Rfl: 0   fluticasone (FLONASE) 50 MCG/ACT nasal spray, Place 2 sprays into both nostrils daily., Disp: 16 g, Rfl: 0   nitrofurantoin, macrocrystal-monohydrate, (MACROBID) 100 MG capsule, Take 1 capsule (100 mg total) by mouth 2 (two) times daily., Disp: 10 capsule, Rfl: 0   Ascorbic Acid (VITAMIN C PO), Take by mouth., Disp: , Rfl:    CALCIUM PO, Take by mouth., Disp: , Rfl:    Chlorphen-PE-Acetaminophen 4-10-325 MG TABS, Take 1 tablet by mouth every 6 (six) hours as needed., Disp: 60 tablet, Rfl: 0   Cholecalciferol (VITAMIN D3 PO), Take by mouth., Disp: , Rfl:    clobetasol cream (TEMOVATE) 1.69 %, Apply 1 Application topically 2 (two) times daily., Disp: 30 g, Rfl: 6   ELDERBERRY PO, Take by mouth., Disp: , Rfl:    Levocetirizine Dihydrochloride (XYZAL PO), Take by mouth., Disp: , Rfl:    meloxicam (MOBIC) 7.5 MG tablet, Take 7.5 mg by mouth 2 (two) times daily., Disp: , Rfl:    methocarbamol (ROBAXIN) 500 MG tablet, Take 500 mg by mouth 2 (two) times daily., Disp: , Rfl:    Multiple Vitamin (MULTIVITAMIN) tablet, Take 1 tablet by mouth daily., Disp: , Rfl:    Norethindrone-Ethinyl Estradiol-Fe Biphas (LO LOESTRIN FE) 1 MG-10 MCG / 10 MCG tablet, TAKE 1 TABLET BY MOUTH ONCE DAILY, Disp: 28 tablet, Rfl: 6  Observations/Objective: Patient is well-developed, well-nourished in no acute  distress.  Resting comfortably at home.  Head is normocephalic, atraumatic.  No labored breathing.  Speech is clear and coherent with logical content.  Patient is alert and oriented at baseline.    Assessment and Plan: 1. Otalgia, right ear - fluticasone (FLONASE) 50 MCG/ACT nasal spray; Place 2 sprays into both nostrils daily.  Dispense: 16 g; Refill: 0 - ciprofloxacin-dexamethasone (CIPRODEX) OTIC suspension; Place 4 drops into the right ear 2 (two) times daily. X 7 days  Dispense: 7.5 mL; Refill: 0  2. Suspected UTI - nitrofurantoin, macrocrystal-monohydrate, (MACROBID) 100 MG capsule; Take 1 capsule (100 mg total) by mouth 2 (two) times daily.  Dispense: 10 capsule; Refill: 0  - Worsening symptoms that have not responded to OTC medications.  - Will give Ciprodex and Flonase - Continue saline nasal rinses - Could consider to add Flonase (Fluticasone) nasal spray over the counter for possible eustachian tube dysfunction - Steam and humidifier can help - Warm compress to ear - Stay well hydrated and get plenty of rest.   - Worsening symptoms.  - Will treat empirically with Macrobid - May use AZO for bladder spasms - Continue to push fluids.   - Seek in person evaluation  for urine culture if symptoms do not improve or if they worsen.    Follow Up Instructions: I discussed the assessment and treatment plan with the patient. The patient was provided an opportunity to ask questions and all were answered. The patient agreed with the plan and demonstrated an understanding of the instructions.  A copy of instructions were sent to the patient via MyChart unless otherwise noted below.    The patient was advised to call back or seek an in-person evaluation if the symptoms worsen or if the condition fails to improve as anticipated.  Time:  I spent 15 minutes with the patient via telehealth technology discussing the above problems/concerns.    Mar Daring, PA-C

## 2022-05-12 ENCOUNTER — Ambulatory Visit: Payer: PRIVATE HEALTH INSURANCE | Admitting: Family Medicine

## 2022-05-16 ENCOUNTER — Telehealth: Payer: Self-pay

## 2022-05-16 NOTE — Telephone Encounter (Signed)
OK to provide her with letter as stated.

## 2022-05-16 NOTE — Telephone Encounter (Signed)
Pt called requesting that a letter be done stating that she has crohn's and needs to be able to go to the bathroom more often during flare ups. Pt states that her job is needing this due to excessive bathroom breaks being a problem.

## 2022-05-16 NOTE — Telephone Encounter (Signed)
Letter was done and pt was notified that it was up front for pick up.

## 2022-05-19 ENCOUNTER — Telehealth: Payer: Self-pay | Admitting: Family Medicine

## 2022-05-19 DIAGNOSIS — J019 Acute sinusitis, unspecified: Secondary | ICD-10-CM

## 2022-05-19 DIAGNOSIS — J301 Allergic rhinitis due to pollen: Secondary | ICD-10-CM

## 2022-05-19 DIAGNOSIS — B9689 Other specified bacterial agents as the cause of diseases classified elsewhere: Secondary | ICD-10-CM

## 2022-05-19 MED ORDER — PREDNISONE 20 MG PO TABS: 20.0000 mg | ORAL_TABLET | Freq: Two times a day (BID) | ORAL | 0 refills | Status: AC

## 2022-05-19 MED ORDER — DOXYCYCLINE HYCLATE 100 MG PO TABS: 100.0000 mg | ORAL_TABLET | Freq: Two times a day (BID) | ORAL | 0 refills | Status: AC

## 2022-05-19 NOTE — Progress Notes (Signed)
Virtual Visit Consent   Shannon Bentley, you are scheduled for a virtual visit with a [facility] provider today. Just as with appointments in the office, your consent must be obtained to participate. Your consent will be active for this visit and any virtual visit you may have with one of our providers in the next 365 days. If you have a MyChart account, a copy of this consent can be sent to you electronically.  As this is a virtual visit, video technology does not allow for your provider to perform a traditional examination. This may limit your provider's ability to fully assess your condition. If your provider identifies any concerns that need to be evaluated in person or the need to arrange testing (such as labs, EKG, etc.), we will make arrangements to do so. Although advances in technology are sophisticated, we cannot ensure that it will always work on either your end or our end. If the connection with a video visit is poor, the visit may have to be switched to a telephone visit. With either a video or telephone visit, we are not always able to ensure that we have a secure connection.  By engaging in this virtual visit, you consent to the provision of healthcare and authorize for your insurance to be billed (if applicable) for the services provided during this visit. Depending on your insurance coverage, you may receive a charge related to this service.  I need to obtain your verbal consent now. Are you willing to proceed with your visit today? Shannon Bentley has provided verbal consent on 05/19/2022 for a virtual visit (video or telephone). Dellia Nims, FNP  Date: 05/19/2022 11:52 AM  Virtual Visit via Video Note   I, Dellia Nims, connected with  Shannon Bentley  ([id number], 22-Mar-1990) on 05/19/22 at 12:00 PM EDT by a video-enabled telemedicine application and verified that I am speaking with the correct person using two identifiers.  Location: Patient: Virtual Visit Location Patient:  Home Provider: Virtual Visit Location Provider: Home Office   I discussed the limitations of evaluation and management by telemedicine and the availability of in person appointments. The patient expressed understanding and agreed to proceed.    History of Present Illness: Shannon Bentley is a 32 y.o. who identifies as a female who was assigned female at birth, and is being seen today for sinus pain and presssure rt maxillary sinus with drainage. Allergy and sinus problems since last weekend worsening. Sx recurrent early. No fever, wheezing or sob. Not pregnant. Marland Kitchen  HPI: HPI  Problems:  Patient Active Problem List   Diagnosis Date Noted   Crohn's disease of both small and large intestine without complication ([place]) [id number]   Crohn's disease ([place]) 08/18/2021   Eczema 08/18/2021   Carpal tunnel syndrome, bilateral 08/18/2021   Abnormal Pap smear of cervix 05/17/2020   Left ovarian cyst 04/13/2020   Abnormal chromosomal and genetic finding on antenatal screening mother 03/29/2020   History of gestational diabetes 03/23/2020   Vitamin D deficiency 05/02/2018   Morbid obesity ([place]) 05/02/2018    Allergies:  Allergies  Allergen Reactions   Amoxicillin-Pot Clavulanate Hives   Bactrim [Sulfamethoxazole-Trimethoprim] Hives   Benadryl [Diphenhydramine] Hives   Dilaudid [Hydromorphone Hcl] Hives   Medications:  Current Outpatient Medications:    Ascorbic Acid (VITAMIN C PO), Take by mouth., Disp: , Rfl:    CALCIUM PO, Take by mouth., Disp: , Rfl:    Chlorphen-PE-Acetaminophen 4-10-325 MG TABS, Take 1 tablet by mouth every 6 (six) hours as needed.,  Disp: 60 tablet, Rfl: 0   Cholecalciferol (VITAMIN D3 PO), Take by mouth., Disp: , Rfl:    ciprofloxacin-dexamethasone (CIPRODEX) OTIC suspension, Place 4 drops into the right ear 2 (two) times daily. X 7 days, Disp: 7.5 mL, Rfl: 0   clobetasol cream (TEMOVATE) [id number] %, Apply 1 Application topically 2 (two) times daily., Disp: 30 g, Rfl: 6    ELDERBERRY PO, Take by mouth., Disp: , Rfl:    fluticasone (FLONASE) 50 MCG/ACT nasal spray, Place 2 sprays into both nostrils daily., Disp: 16 g, Rfl: 0   Levocetirizine Dihydrochloride (XYZAL PO), Take by mouth., Disp: , Rfl:    meloxicam (MOBIC) 7.5 MG tablet, Take 7.5 mg by mouth 2 (two) times daily., Disp: , Rfl:    methocarbamol (ROBAXIN) 500 MG tablet, Take 500 mg by mouth 2 (two) times daily., Disp: , Rfl:    Multiple Vitamin (MULTIVITAMIN) tablet, Take 1 tablet by mouth daily., Disp: , Rfl:    nitrofurantoin, macrocrystal-monohydrate, (MACROBID) 100 MG capsule, Take 1 capsule (100 mg total) by mouth 2 (two) times daily., Disp: 10 capsule, Rfl: 0   Norethindrone-Ethinyl Estradiol-Fe Biphas (LO LOESTRIN FE) 1 MG-10 MCG / 10 MCG tablet, TAKE 1 TABLET BY MOUTH ONCE DAILY, Disp: 28 tablet, Rfl: 6  Observations/Objective: Patient is well-developed, well-nourished in no acute distress.  Resting comfortably  at home.  Head is normocephalic, atraumatic.  No labored breathing.  Speech is clear and coherent with logical content.  Patient is alert and oriented at baseline.    Assessment and Plan: 1. Seasonal allergic rhinitis due to pollen  2. Acute bacterial sinusitis  Increase fluids, may use allegra and flonase, UC if sx worsen.   Follow Up Instructions: I discussed the assessment and treatment plan with the patient. The patient was provided an opportunity to ask questions and all were answered. The patient agreed with the plan and demonstrated an understanding of the instructions.  A copy of instructions were sent to the patient via MyChart unless otherwise noted below.     The patient was advised to call back or seek an in-person evaluation if the symptoms worsen or if the condition fails to improve as anticipated.  Time:  I spent 10 minutes with the patient via telehealth technology discussing the above problems/concerns.    Dellia Nims, FNP

## 2022-05-19 NOTE — Patient Instructions (Signed)
Allergic Rhinitis, Adult  Allergic rhinitis is an allergic reaction that affects the mucous membrane inside the nose. The mucous membrane is the tissue that produces mucus. There are two types of allergic rhinitis: Seasonal. This type is also called hay fever and happens only during certain seasons. Perennial. This type can happen at any time of the year. Allergic rhinitis cannot be spread from person to person. This condition can be mild, bad, or very bad. It can develop at any age and may be outgrown. What are the causes? This condition is caused by allergens. These are things that can cause an allergic reaction. Allergens may differ for seasonal allergic rhinitis and perennial allergic rhinitis. Seasonal allergic rhinitis is caused by pollen. Pollen can come from grasses, trees, and weeds. Perennial allergic rhinitis may be caused by: Dust mites. Proteins in a pet's pee (urine), saliva, or dander. Dander is dead skin cells from a pet. Smoke, mold, or car fumes. Remains of or waste from insects such as cockroaches. What increases the risk? You are more likely to develop this condition if you have a family history of allergies or other conditions related to allergies, including: Allergic conjunctivitis. This is irritation and swelling of parts of the eyes and eyelids. Asthma. This condition affects the lungs and makes it hard to breathe. Atopic dermatitis or eczema. This is long term (chronic) irritation and swelling of the skin. Food allergies. What are the signs or symptoms? Symptoms of this condition include: Sneezing or coughing. A stuffy nose (nasal congestion), itchy nose, or nasal discharge. Itchy eyes and tearing of the eyes. A feeling of mucus dripping down the back of your throat (postnasal drip). This may cause a sore throat. Trouble sleeping. Tiredness. Headache. How is this diagnosed? This condition may be diagnosed with your symptoms, your medical history, and a physical  exam. Your health care provider may check for related conditions, such as: Asthma. Pink eye. This is eye swelling and irritation caused by infection (conjunctivitis). Ear infection. Upper respiratory infection. This is an infection in the nose, throat, or upper airways. You may also have tests to find out which allergens cause your symptoms. These may include skin tests or blood tests. How is this treated? There is no cure for this condition, but treatment can help control symptoms. Treatment may include: Taking medicines that block allergy symptoms, such as corticosteroids (anti-inflammatories) and antihistamines. Medicine may be given as a shot, nasal spray, or pill. Avoiding any allergens. Being exposed again and again to tiny amounts of allergens to help you build a defense against allergens (allergenimmunotherapy). This is done if other treatments have not helped. It may include: Allergy shots. These are injected medicines that have small amounts of an allergen in them. Sublingual immunotherapy. This involves taking small doses of a medicine with an allergen in it under your tongue. If these treatments do not work, your provider may prescribe newer, stronger medicines. Follow these instructions at home: Avoiding allergens Find out what you are allergic to and avoid those allergens. These are some things you can do to help avoid allergens: If you have perennial allergies: Replace carpet with wood, tile, or vinyl flooring. Carpet can trap dander and dust. Do not smoke. Do not allow smoking in your home Change your heating and air conditioning filters at least once a month. If you have seasonal allergies, take these steps during allergy season: Keep windows closed as much as possible. Plan outdoor activities when pollen counts are lowest. Check pollen counts before   you plan outdoor activities When coming indoors, change clothing and shower before sitting on furniture or bedding. If you  have a pet in the house that produces allergens: Keep the pet out of the bedroom. Vacuum, sweep, and dust regularly. General instructions Take over-the-counter and prescription medicines only as told by your provider. Drink enough fluid to keep your pee pale yellow. Where to find more information American Academy of Allergy, Asthma & Immunology: aaaai.org Contact a health care provider if: You have a fever. You develop a cough that does not go away. You make high-pitched whistling sounds when you breathe, most often when you breathe out (wheeze). Your symptoms slow you down or stop you from doing your normal activities each day. Get help right away if: You have shortness of breath. This symptom may be an emergency. Get help right away. Call 911. Do not wait to see if the symptoms will go away. Do not drive yourself to the hospital. This information is not intended to replace advice given to you by your health care provider. Make sure you discuss any questions you have with your health care provider. Document Revised: 10/17/2021 Document Reviewed: 10/17/2021 Elsevier Patient Education  [place]. Sinus Infection, Adult A sinus infection, also called sinusitis, is inflammation of your sinuses. Sinuses are hollow spaces in the bones around your face. Your sinuses are located: Around your eyes. In the middle of your forehead. Behind your nose. In your cheekbones. Mucus normally drains out of your sinuses. When your nasal tissues become inflamed or swollen, mucus can become trapped or blocked. This allows bacteria, viruses, and fungi to grow, which leads to infection. Most infections of the sinuses are caused by a virus. A sinus infection can develop quickly. It can last for up to 4 weeks (acute) or for more than 12 weeks (chronic). A sinus infection often develops after a cold. What are the causes? This condition is caused by anything that creates swelling in the sinuses or stops  mucus from draining. This includes: Allergies. Asthma. Infection from bacteria or viruses. Deformities or blockages in your nose or sinuses. Abnormal growths in the nose (nasal polyps). Pollutants, such as chemicals or irritants in the air. Infection from fungi. This is rare. What increases the risk? You are more likely to develop this condition if you: Have a weak body defense system (immune system). Do a lot of swimming or diving. Overuse nasal sprays. Smoke. What are the signs or symptoms? The main symptoms of this condition are pain and a feeling of pressure around the affected sinuses. Other symptoms include: Stuffy nose or congestion that makes it difficult to breathe through your nose. Thick yellow or greenish drainage from your nose. Tenderness, swelling, and warmth over the affected sinuses. A cough that may get worse at night. Decreased sense of smell and taste. Extra mucus that collects in the throat or the back of the nose (postnasal drip) causing a sore throat or bad breath. Tiredness (fatigue). Fever. How is this diagnosed? This condition is diagnosed based on: Your symptoms. Your medical history. A physical exam. Tests to find out if your condition is acute or chronic. This may include: Checking your nose for nasal polyps. Viewing your sinuses using a device that has a light (endoscope). Testing for allergies or bacteria. Imaging tests, such as an MRI or CT scan. In rare cases, a bone biopsy may be done to rule out more serious types of fungal sinus disease. How is this treated? Treatment for a  sinus infection depends on the cause and whether your condition is chronic or acute. If caused by a virus, your symptoms should go away on their own within 10 days. You may be given medicines to relieve symptoms. They include: Medicines that shrink swollen nasal passages (decongestants). A spray that eases inflammation of the nostrils (topical intranasal  corticosteroids). Rinses that help get rid of thick mucus in your nose (nasal saline washes). Medicines that treat allergies (antihistamines). Over-the-counter pain relievers. If caused by bacteria, your health care provider may recommend waiting to see if your symptoms improve. Most bacterial infections will get better without antibiotic medicine. You may be given antibiotics if you have: A severe infection. A weak immune system. If caused by narrow nasal passages or nasal polyps, surgery may be needed. Follow these instructions at home: Medicines Take, use, or apply over-the-counter and prescription medicines only as told by your health care provider. These may include nasal sprays. If you were prescribed an antibiotic medicine, take it as told by your health care provider. Do not stop taking the antibiotic even if you start to feel better. Hydrate and humidify  Drink enough fluid to keep your urine pale yellow. Staying hydrated will help to thin your mucus. Use a cool mist humidifier to keep the humidity level in your home above 50%. Inhale steam for 10-15 minutes, 3-4 times a day, or as told by your health care provider. You can do this in the bathroom while a hot shower is running. Limit your exposure to cool or dry air. Rest Rest as much as possible. Sleep with your head raised (elevated). Make sure you get enough sleep each night. General instructions  Apply a warm, moist washcloth to your face 3-4 times a day or as told by your health care provider. This will help with discomfort. Use nasal saline washes as often as told by your health care provider. Wash your hands often with soap and water to reduce your exposure to germs. If soap and water are not available, use hand sanitizer. Do not smoke. Avoid being around people who are smoking (secondhand smoke). Keep all follow-up visits. This is important. Contact a health care provider if: You have a fever. Your symptoms get  worse. Your symptoms do not improve within 10 days. Get help right away if: You have a severe headache. You have persistent vomiting. You have severe pain or swelling around your face or eyes. You have vision problems. You develop confusion. Your neck is stiff. You have trouble breathing. These symptoms may be an emergency. Get help right away. Call 911. Do not wait to see if the symptoms will go away. Do not drive yourself to the hospital. Summary A sinus infection is soreness and inflammation of your sinuses. Sinuses are hollow spaces in the bones around your face. This condition is caused by nasal tissues that become inflamed or swollen. The swelling traps or blocks the flow of mucus. This allows bacteria, viruses, and fungi to grow, which leads to infection. If you were prescribed an antibiotic medicine, take it as told by your health care provider. Do not stop taking the antibiotic even if you start to feel better. Keep all follow-up visits. This is important. This information is not intended to replace advice given to you by your health care provider. Make sure you discuss any questions you have with your health care provider. Document Revised: 01/11/2021 Document Reviewed: 01/11/2021 Elsevier Patient Education  [place].

## 2022-08-04 IMAGING — US US MFM OB DETAIL+14 WK
1 series · 13 of 28 positions shown · non-contrast
Comparison: none

[Series 1: us mfm ob detail+14 wk · 13 of 96 slices shown]
[im 4/96]
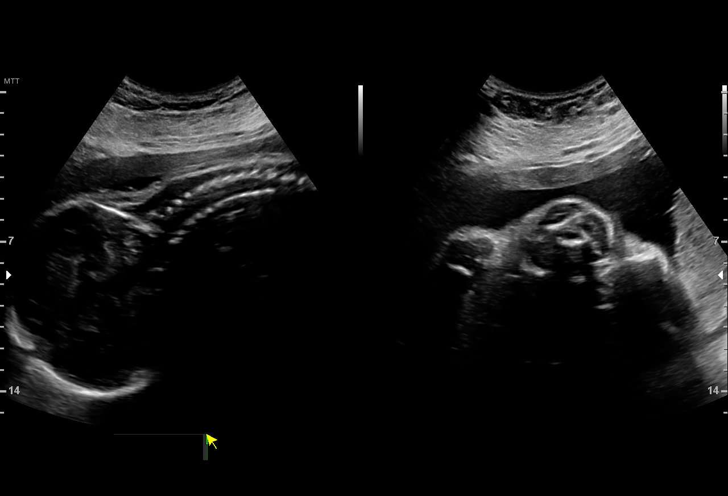
[im 11/96]
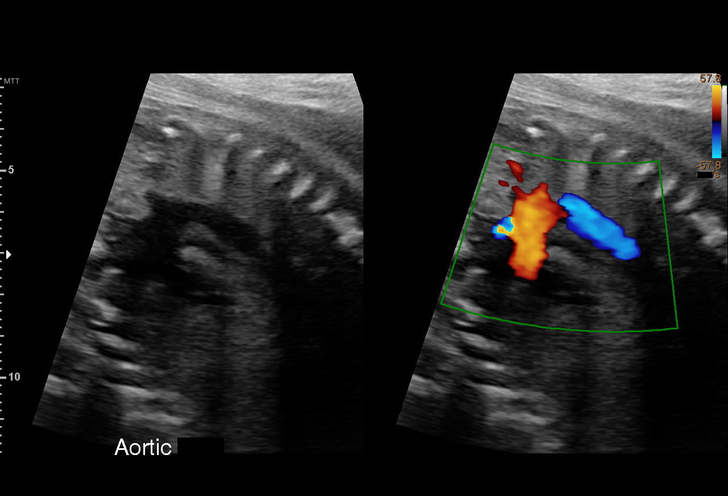
[im 18/96]
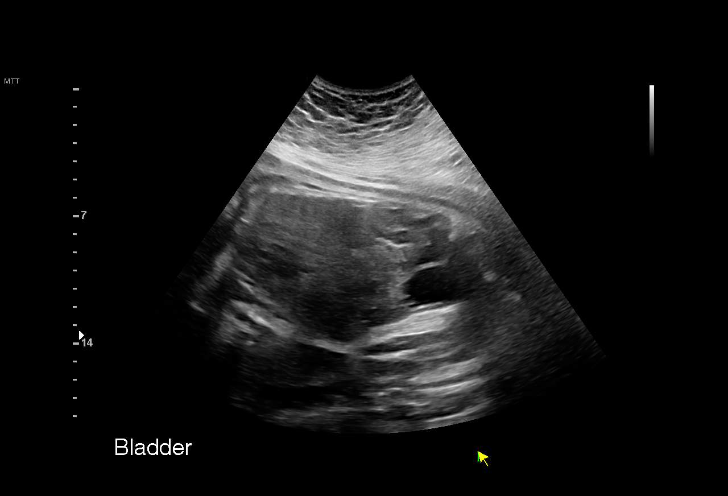
[im 25/96]
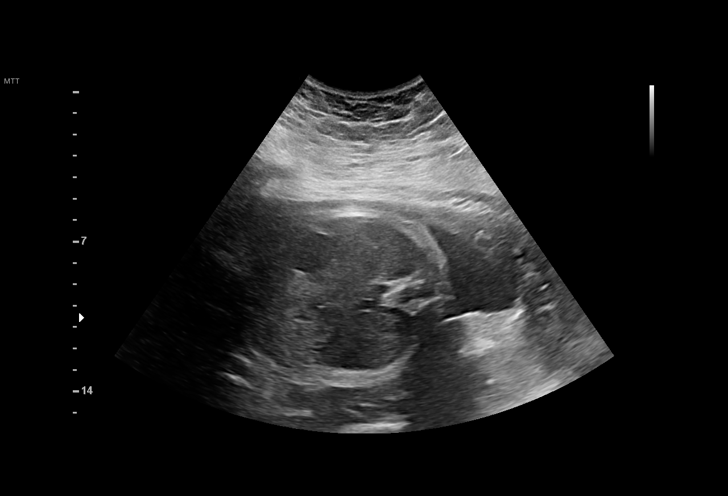
[im 32/96]
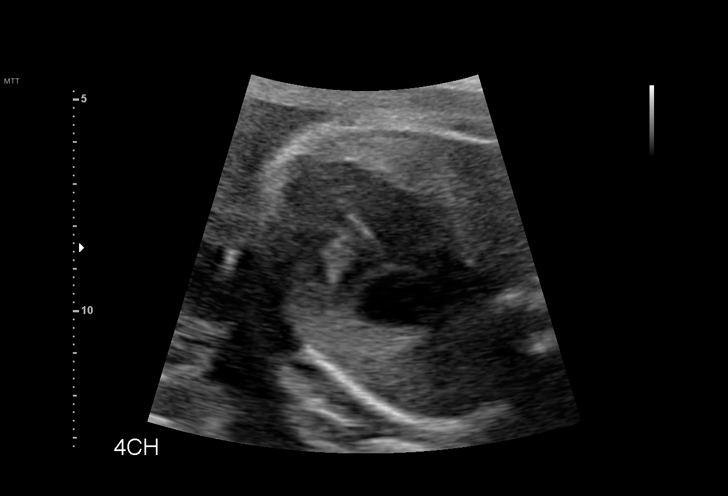
[im 39/96]
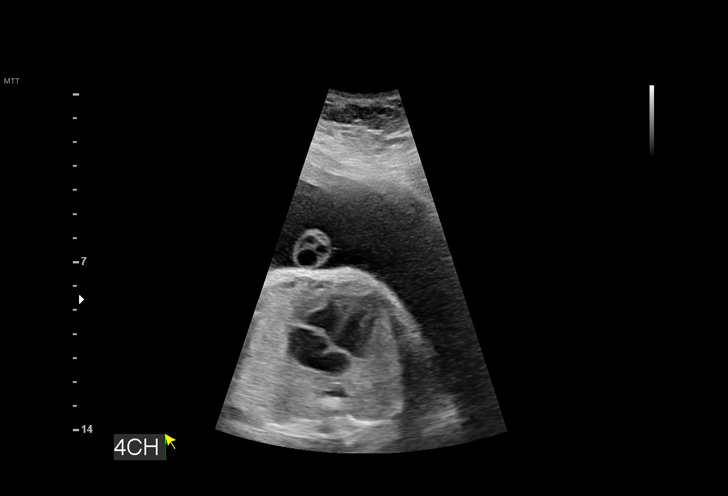
[im 50/96]
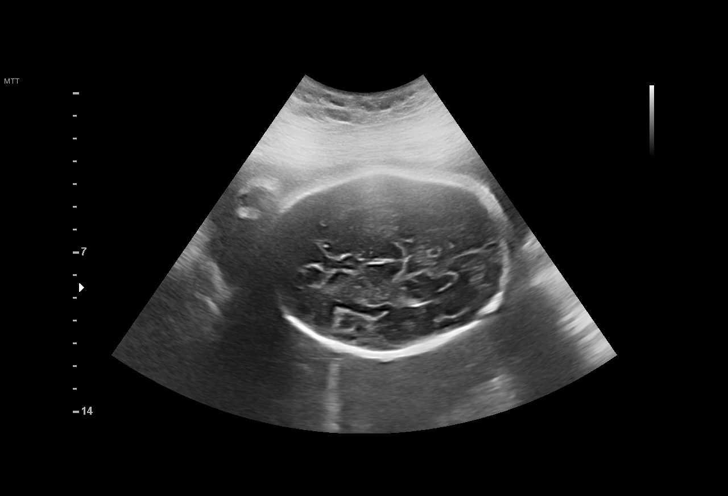
[im 57/96]
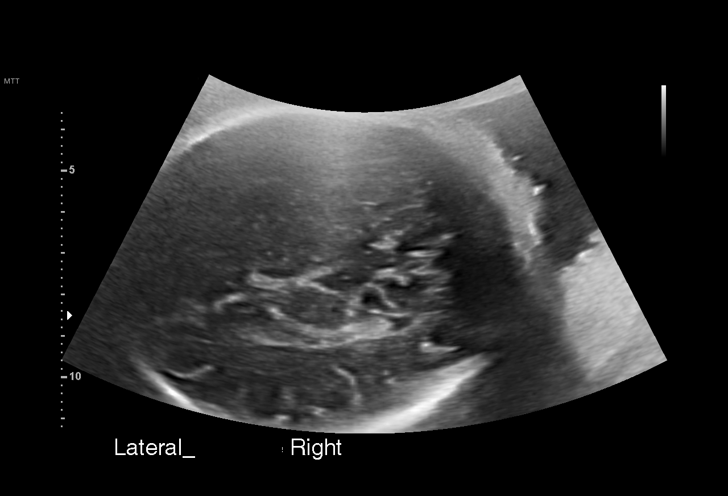
[im 64/96]
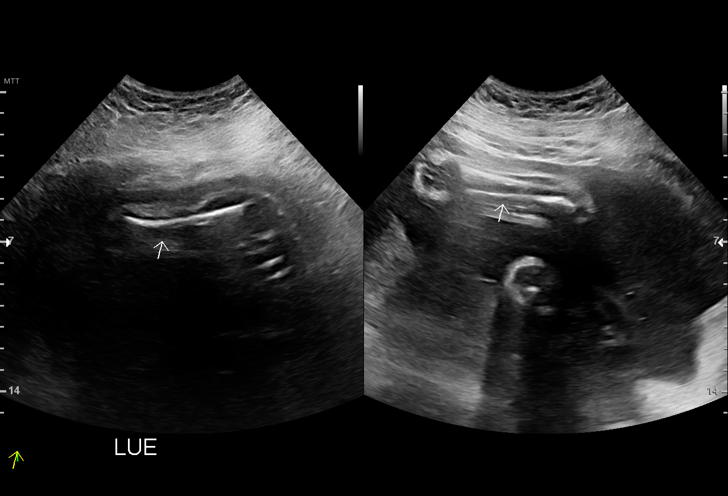
[im 71/96]
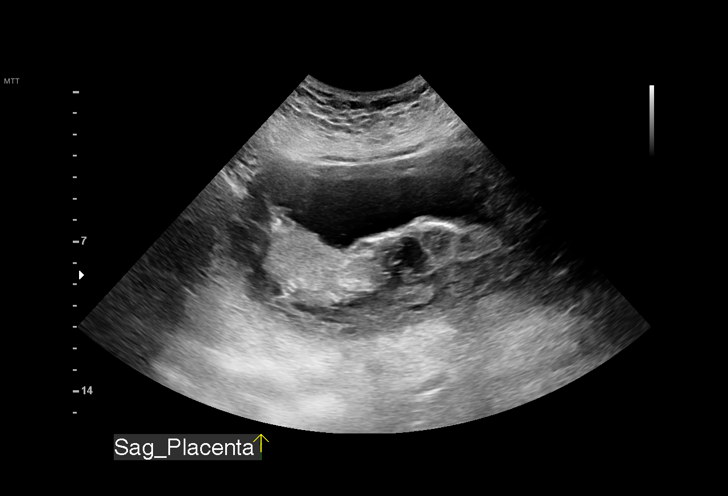
[im 78/96]
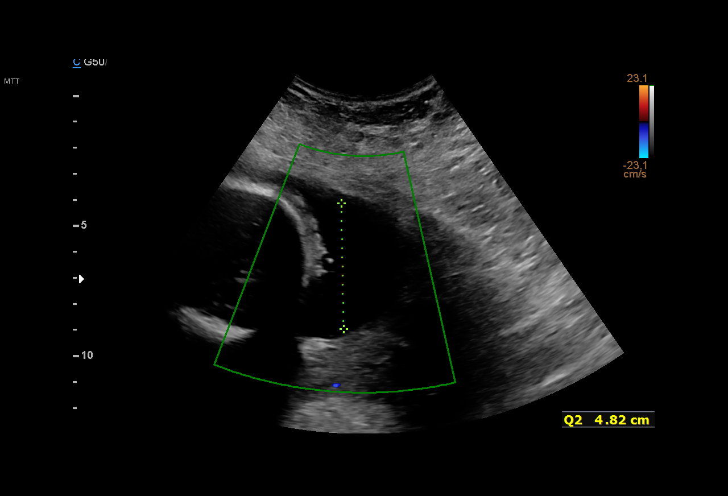
[im 85/96]
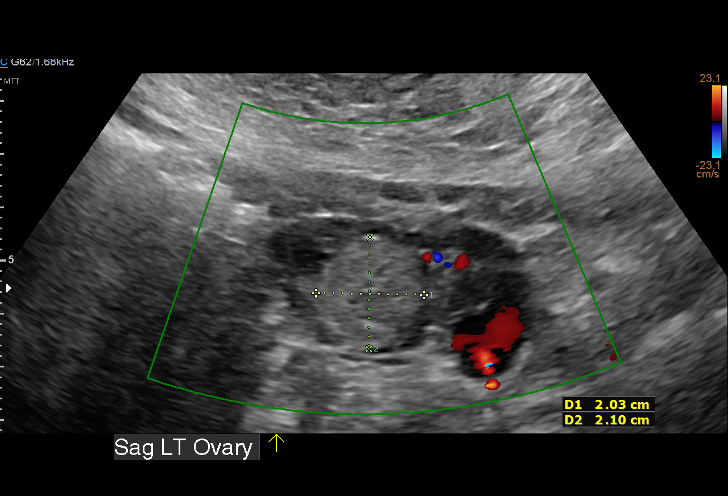
[im 92/96]
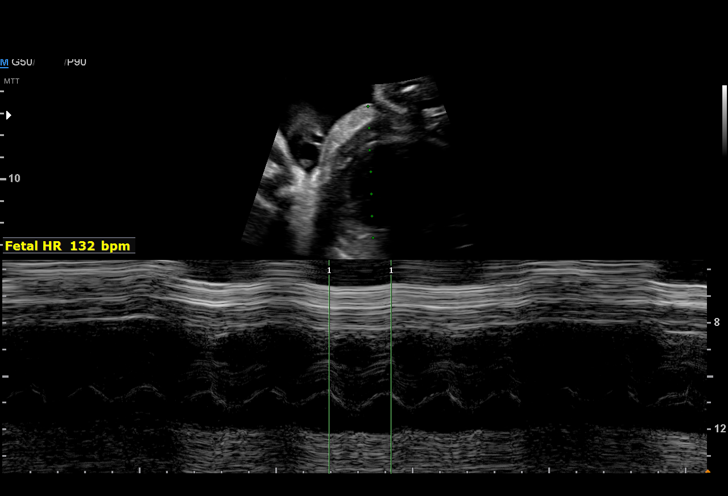

[13 of 28 positions shown; findings below may reference images not displayed]

MOATSHE

Indications

 33 weeks gestation of pregnancy
 Abnormal biochemical screen (Atypical Sex
 Chromosome)
 Obesity complicating pregnancy, second
 trimester (BMI 49)
 Medical complication of pregnancy (Crohn's)
 Gestational diabetes in pregnancy, diet
 controlled
Fetal Evaluation

 Num Of Fetuses:         1
 Fetal Heart Rate(bpm):  152
 Cardiac Activity:       Observed
 Presentation:           Breech
 Placenta:               Posterior
 P. Cord Insertion:      Not well visualized

 Amniotic Fluid
 AFI FV:      Within normal limits

 AFI Sum(cm)     %Tile       Largest Pocket(cm)
 17.7            65

 RUQ(cm)       RLQ(cm)       LUQ(cm)        LLQ(cm)

Biometry
 BPD:      79.3  mm     G. Age:  31w 6d          8  %    CI:        70.18   %    70 - 86
                                                         FL/HC:      20.1   %    19.9 -
 HC:      301.9  mm     G. Age:  33w 4d         17  %    HC/AC:      1.02        0.96 -
 AC:       295   mm     G. Age:  33w 3d         54  %    FL/BPD:     76.4   %    71 - 87
 FL:       60.6  mm     G. Age:  31w 4d        4.6  %    FL/AC:      20.5   %    20 - 24
 CER:      41.5  mm     G. Age:  33w 1d         22  %
 LV:        5.5  mm
 CM:          8  mm

 Est. FW:    4454  gm      4 lb 8 oz     23  %
OB History

 Gravidity:    1
Gestational Age

 LMP:           33w 3d        Date:  12/07/19                 EDD:   09/12/20
 U/S Today:     32w 4d                                        EDD:   09/18/20
 Best:          33w 3d     Det. By:  LMP  (12/07/19)          EDD:   09/12/20
Anatomy

 Cranium:               Appears normal         Aortic Arch:            Appears normal
 Cavum:                 Appears normal         Ductal Arch:            Not well visualized
 Ventricles:            Appears normal         Diaphragm:              Appears normal
 Choroid Plexus:        Appears normal         Stomach:                Appears normal, left
                                                                       sided
 Cerebellum:            Appears normal         Abdomen:                Appears normal
 Posterior Fossa:       Appears normal         Abdominal Wall:         Appears nml (cord
                                                                       insert, abd wall)
 Nuchal Fold:           Not applicable (>20    Cord Vessels:           Appears normal (3
                        wks GA)                                        vessel cord)
 Face:                  Appears normal         Kidneys:                Appear normal
                        (orbits and profile)
 Lips:                  Not well visualized    Bladder:                Appears normal
 Thoracic:              Appears normal         Spine:                  Appears normal
 Heart:                 Appears normal         Upper Extremities:      Visualized
                        (4CH, axis, and
                        situs)
 RVOT:                  Appears normal         Lower Extremities:      Visualized
 LVOT:                  Appears normal

 Other:  Technically difficult due to advanced GA, maternal habitus, and fetal
         position.
Cervix Uterus Adnexa

 Cervix
 Length:           3.24  cm.
 Normal appearance by transabdominal scan.

 Uterus
 No abnormality visualized.
 Right Ovary
 Within normal limits.

 Left Ovary
 Probable dermoid 3.0 cm within Left ovary.

 Cul De Sac
 No free fluid seen.

 Adnexa
 No abnormality visualized.
Impression

 G1 P0.  Patient is here for ultrasound evaluation.  On cell free
 fetal DNA screening atypical finding on sex chromosomes
 were seen.  The risk for Down syndrome is not increased.
 She is a silent carrier for alpha thalassemia (aa/a-).  Patient
 had met with our genetic counselor and had opted not to
 have amniocentesis.
 She has a recent diagnosis of gestational diabetes and
 reports that diabetes is well controlled on diet.  Hemoglobin
 A1c was 6.2%.
 At your office ultrasound a small left ovarian dermoid cyst
 was seen.
 On today's ultrasound, amniotic fluid is normal good fetal
 activity seen.  Fetal growth is appropriate for gestational age.
 Fetal anatomical survey appears normal but limited by
 advanced gestational age.  Fetal genitalia could not be
 clearly ascertained because of fetal position.
 Left ovarian mass (possibly dermoid) measuring 3 x 2 x
 cm is seen.  Patient does not have symptoms pertaining to
 the dermoid cyst.
 I reassured the patient of the findings.
Recommendations

 -An appointment was made for her to return in 4 weeks for
 fetal growth assessment.
 -If patient requires insulin or oral hypoglycemics, we
 recommend weekly BPP till delivery.
                 Lemm, [place]on

## 2022-08-30 ENCOUNTER — Telehealth: Payer: Self-pay | Admitting: Nurse Practitioner

## 2022-08-30 ENCOUNTER — Encounter: Payer: Self-pay | Admitting: Nurse Practitioner

## 2022-08-30 DIAGNOSIS — H66001 Acute suppurative otitis media without spontaneous rupture of ear drum, right ear: Secondary | ICD-10-CM

## 2022-08-30 DIAGNOSIS — J0100 Acute maxillary sinusitis, unspecified: Secondary | ICD-10-CM

## 2022-08-30 DIAGNOSIS — J309 Allergic rhinitis, unspecified: Secondary | ICD-10-CM

## 2022-08-30 MED ORDER — CETIRIZINE HCL 10 MG PO TABS: 10.0000 mg | ORAL_TABLET | Freq: Every day | ORAL | 2 refills | Status: AC

## 2022-08-30 MED ORDER — AZITHROMYCIN 250 MG PO TABS: ORAL_TABLET | ORAL | 0 refills | Status: AC

## 2022-08-30 NOTE — Progress Notes (Signed)
Virtual Visit Consent   Shannon Bentley, you are scheduled for a virtual visit with a [facility] provider today. Just as with appointments in the office, your consent must be obtained to participate. Your consent will be active for this visit and any virtual visit you may have with one of our providers in the next 365 days. If you have a MyChart account, a copy of this consent can be sent to you electronically.  As this is a virtual visit, video technology does not allow for your provider to perform a traditional examination. This may limit your provider's ability to fully assess your condition. If your provider identifies any concerns that need to be evaluated in person or the need to arrange testing (such as labs, EKG, etc.), we will make arrangements to do so. Although advances in technology are sophisticated, we cannot ensure that it will always work on either your end or our end. If the connection with a video visit is poor, the visit may have to be switched to a telephone visit. With either a video or telephone visit, we are not always able to ensure that we have a secure connection.  By engaging in this virtual visit, you consent to the provision of healthcare and authorize for your insurance to be billed (if applicable) for the services provided during this visit. Depending on your insurance coverage, you may receive a charge related to this service.  I need to obtain your verbal consent now. Are you willing to proceed with your visit today? Shannon Bentley has provided verbal consent on 08/30/2022 for a virtual visit (video or telephone). Shannon Simas, FNP  Date: 08/30/2022 7:04 PM  Virtual Visit via Video Note   I, Shannon Bentley, connected with  Shannon Bentley  ([id number], January 05, 1991) on 08/30/22 at  7:15 PM EDT by a video-enabled telemedicine application and verified that I am speaking with the correct person using two identifiers.  Location: Patient: Virtual Visit Location Patient:  Home Provider: Virtual Visit Location Provider: Home Office   I discussed the limitations of evaluation and management by telemedicine and the availability of in person appointments. The patient expressed understanding and agreed to proceed.    History of Present Illness: Shannon Bentley is a 32 y.o. who identifies as a female who was assigned female at birth, and is being seen today for sinus congestion and ear pain.   She has been having ear pain on the right  Also has some sinus pressure around her right eye and under her eye  She has had a runny nose more than usually with post nasal drainage   She suffers from allergies   She has been using Nasacort nasal spray for relief   Problems:  Patient Active Problem List   Diagnosis Date Noted   Crohn's disease of both small and large intestine without complication (HCC) 11/18/2021   Crohn's disease (HCC) 08/18/2021   Eczema 08/18/2021   Carpal tunnel syndrome, bilateral 08/18/2021   Abnormal Pap smear of cervix 05/17/2020   Left ovarian cyst 04/13/2020   Abnormal chromosomal and genetic finding on antenatal screening mother 03/29/2020   History of gestational diabetes 03/23/2020   Vitamin D deficiency 05/02/2018   Morbid obesity (HCC) 05/02/2018    Allergies:  Allergies  Allergen Reactions   Amoxicillin-Pot Clavulanate Hives   Bactrim [Sulfamethoxazole-Trimethoprim] Hives   Benadryl [Diphenhydramine] Hives   Dilaudid [Hydromorphone Hcl] Hives   Medications:  Current Outpatient Medications:    Ascorbic Acid (VITAMIN C PO), Take by mouth.,  Disp: , Rfl:    CALCIUM PO, Take by mouth., Disp: , Rfl:    Chlorphen-PE-Acetaminophen 4-10-325 MG TABS, Take 1 tablet by mouth every 6 (six) hours as needed., Disp: 60 tablet, Rfl: 0   Cholecalciferol (VITAMIN D3 PO), Take by mouth., Disp: , Rfl:    ciprofloxacin-dexamethasone (CIPRODEX) OTIC suspension, Place 4 drops into the right ear 2 (two) times daily. X 7 days, Disp: 7.5 mL, Rfl: 0    clobetasol cream (TEMOVATE) 0.05 %, Apply 1 Application topically 2 (two) times daily., Disp: 30 g, Rfl: 6   ELDERBERRY PO, Take by mouth., Disp: , Rfl:    fluticasone (FLONASE) 50 MCG/ACT nasal spray, Place 2 sprays into both nostrils daily., Disp: 16 g, Rfl: 0   Levocetirizine Dihydrochloride (XYZAL PO), Take by mouth., Disp: , Rfl:    meloxicam (MOBIC) 7.5 MG tablet, Take 7.5 mg by mouth 2 (two) times daily., Disp: , Rfl:    methocarbamol (ROBAXIN) 500 MG tablet, Take 500 mg by mouth 2 (two) times daily., Disp: , Rfl:    Multiple Vitamin (MULTIVITAMIN) tablet, Take 1 tablet by mouth daily., Disp: , Rfl:    nitrofurantoin, macrocrystal-monohydrate, (MACROBID) 100 MG capsule, Take 1 capsule (100 mg total) by mouth 2 (two) times daily., Disp: 10 capsule, Rfl: 0   Norethindrone-Ethinyl Estradiol-Fe Biphas (LO LOESTRIN FE) 1 MG-10 MCG / 10 MCG tablet, TAKE 1 TABLET BY MOUTH ONCE DAILY, Disp: 28 tablet, Rfl: 6  Observations/Objective: Patient is well-developed, well-nourished in no acute distress.  Resting comfortably  at home.  Head is normocephalic, atraumatic.  No labored breathing.  Speech is clear and coherent with logical content.  Patient is alert and oriented at baseline.    Assessment and Plan:   1. Non-recurrent acute suppurative otitis media of right ear without spontaneous rupture of tympanic membrane  - azithromycin (ZITHROMAX) 250 MG tablet; Take 2 tablets on day 1, then 1 tablet daily on days 2 through 5  Dispense: 6 tablet; Refill: 0  2. Acute non-recurrent maxillary sinusitis  3. Allergic rhinitis, unspecified seasonality, unspecified trigger  - cetirizine (ZYRTEC ALLERGY) 10 MG tablet; Take 1 tablet (10 mg total) by mouth at bedtime.  Dispense: 30 tablet; Refill: 2    Continue Nasacort daily   Follow Up Instructions: I discussed the assessment and treatment plan with the patient. The patient was provided an opportunity to ask questions and all were answered. The  patient agreed with the plan and demonstrated an understanding of the instructions.  A copy of instructions were sent to the patient via MyChart unless otherwise noted below.    The patient was advised to call back or seek an in-person evaluation if the symptoms worsen or if the condition fails to improve as anticipated.  Time:  I spent 15 minutes with the patient via telehealth technology discussing the above problems/concerns.    Shannon Simas, FNP

## 2022-09-02 IMAGING — US US MFM OB FOLLOW-UP
1 series · 14 of 28 positions shown · non-contrast
Comparison: none

[Series 1: us mfm ob follow-up · 49 acquisitions, 14 frames shown]
[im 2/49]
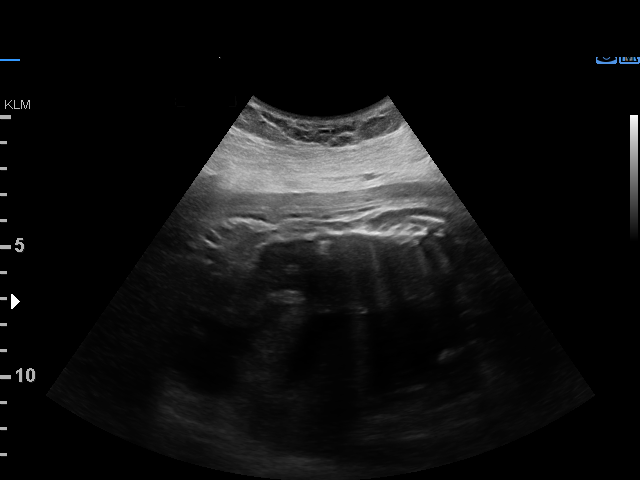
[im 6/49]
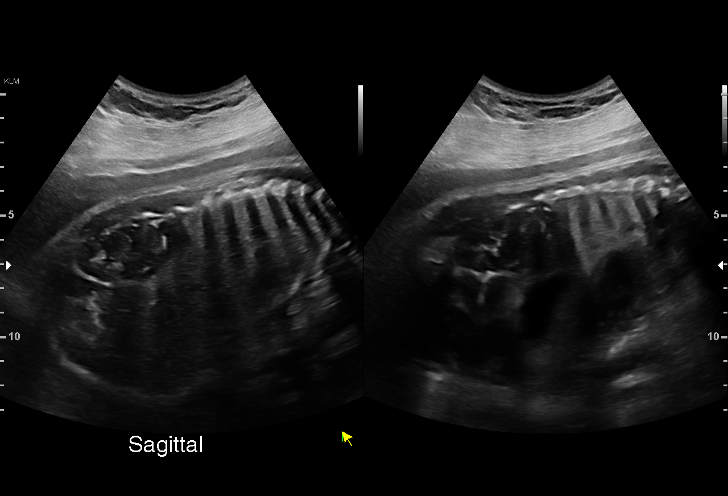
[im 9/49]
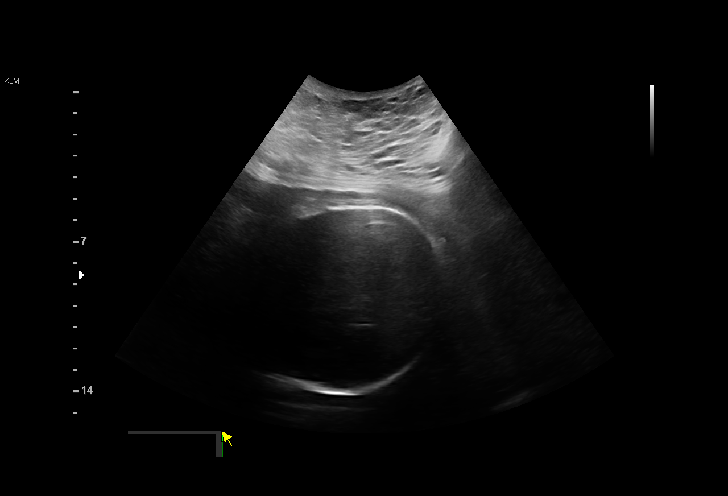
[im 13/49]
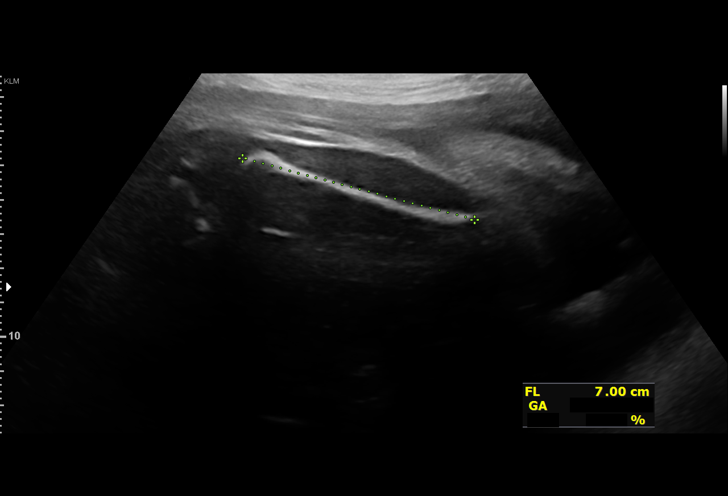
[im 17/49]
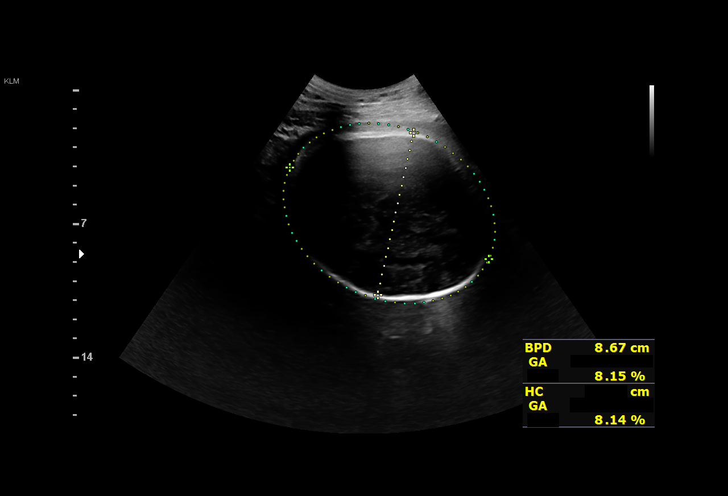
[im 20/49]
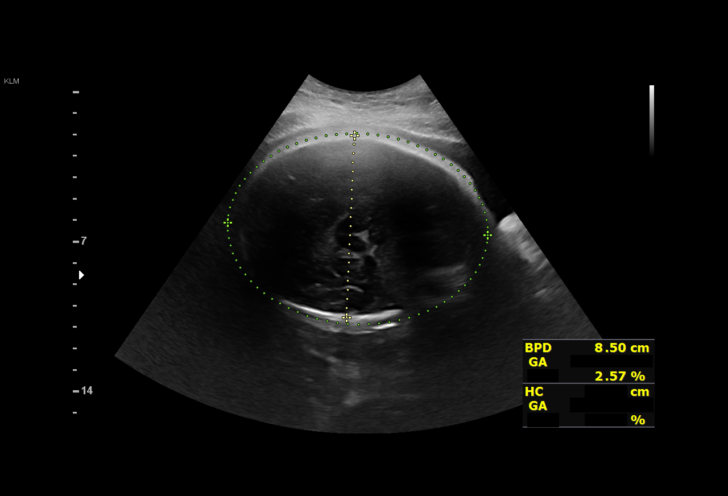
[im 24/49]
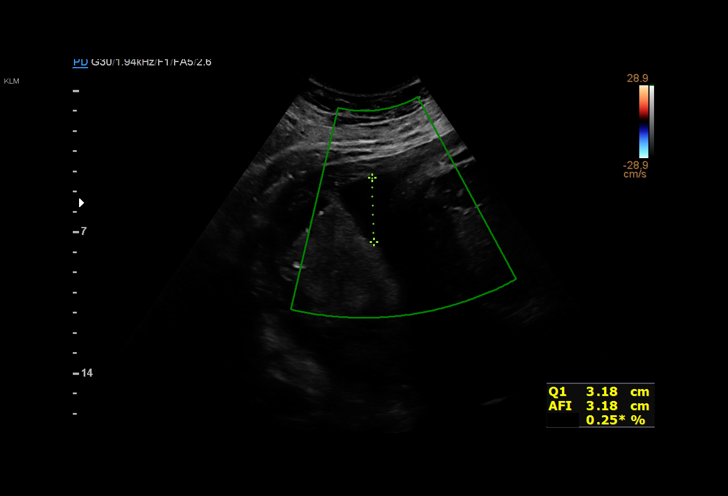
[im 27/49]
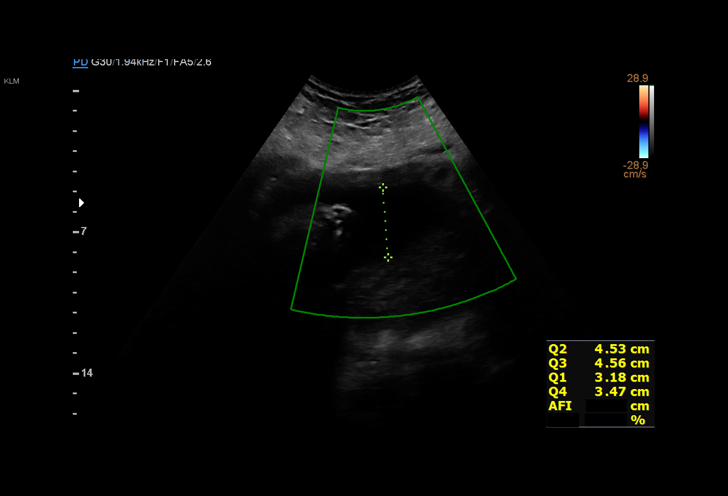
[im 31/49]
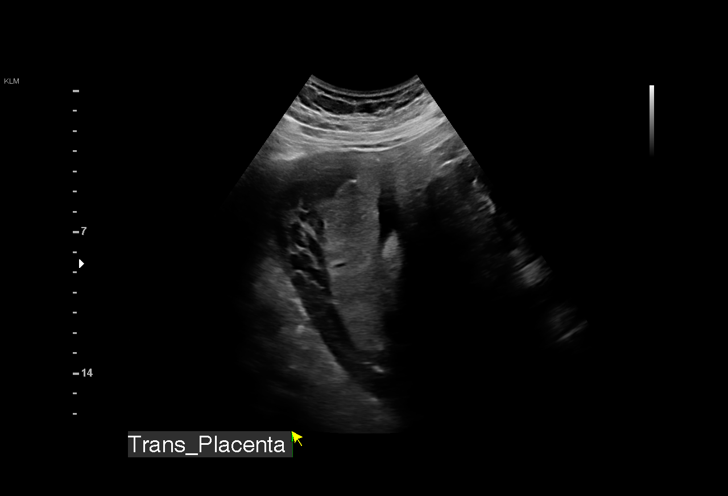
[im 34/49]
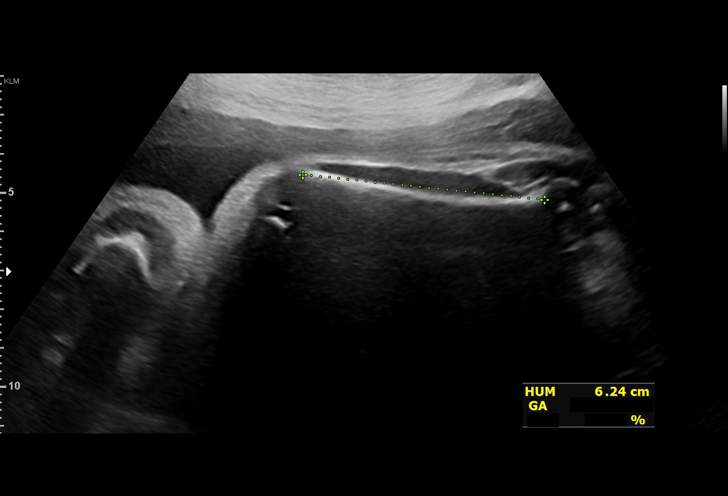
[im 38/49]
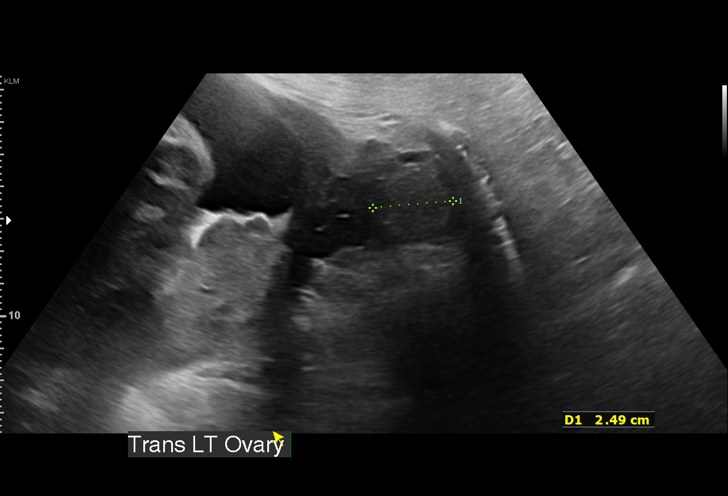
[im 41/49]
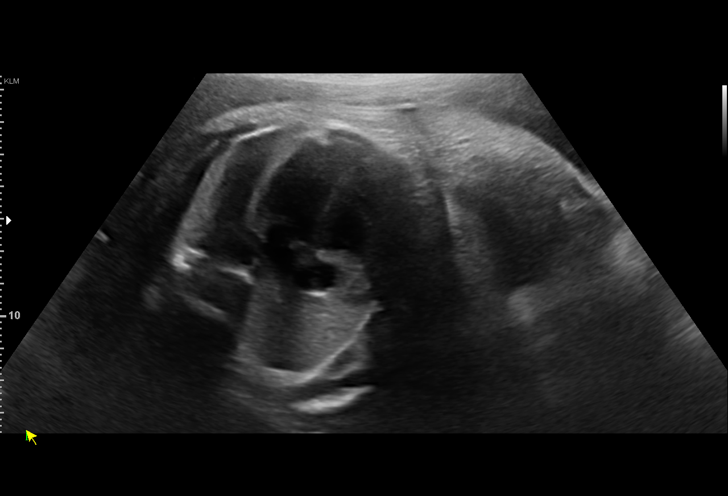
[im 45/49]
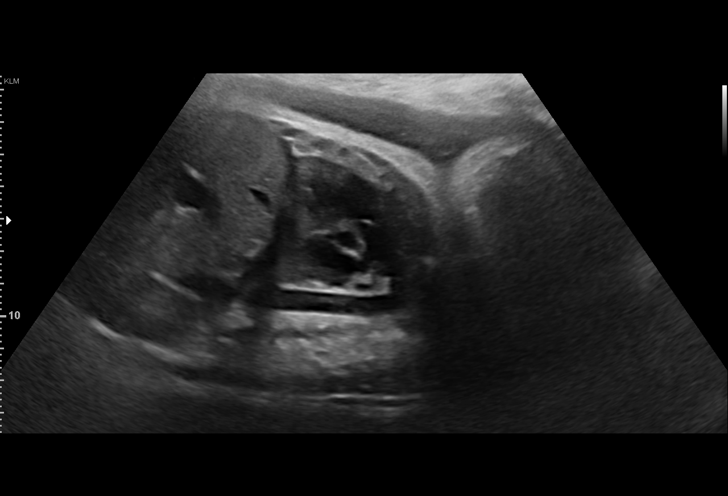
[im 49/49]
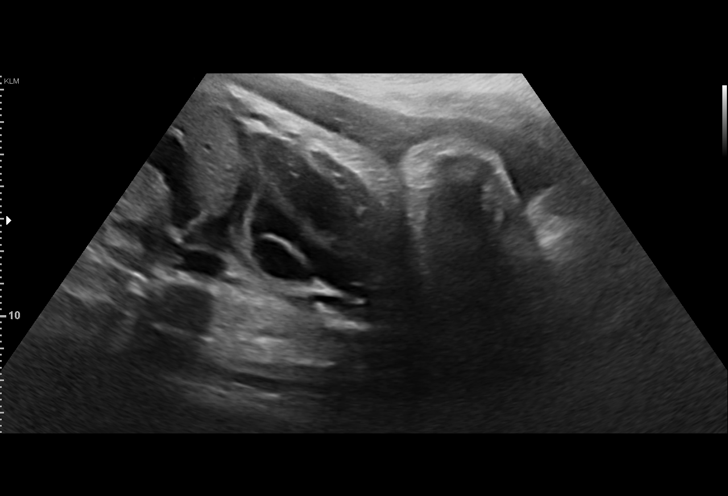

[14 of 28 positions shown; findings below may reference images not displayed]

Indications

 Abnormal biochemical screen (Atypical Sex
 Chromosome)
 37 weeks gestation of pregnancy
 Medical complication of pregnancy (Crohn's)
 Gestational diabetes in pregnancy, diet
 controlled
 Obesity complicating pregnancy, third
 trimester (BMI 49)
Fetal Evaluation

 Num Of Fetuses:         1
 Fetal Heart Rate(bpm):  144
 Cardiac Activity:       Observed
 Presentation:           Cephalic
 Placenta:               Posterior
 P. Cord Insertion:      Previously Visualized

 Amniotic Fluid
 AFI FV:      Within normal limits

 AFI Sum(cm)     %Tile       Largest Pocket(cm)
 15.74           60

 RUQ(cm)       RLQ(cm)       LUQ(cm)        LLQ(cm)

Biometry
 BPD:      86.6  mm     G. Age:  35w 0d          8  %    CI:        70.29   %    70 - 86
                                                         FL/HC:      21.3   %    20.9 -
 HC:      329.4  mm     G. Age:  37w 3d         25  %    HC/AC:      0.99        0.92 -
 AC:      332.1  mm     G. Age:  37w 1d         53  %    FL/BPD:     80.8   %    71 - 87
 FL:         70  mm     G. Age:  35w 6d         13  %    FL/AC:      21.1   %    20 - 24
 HUM:      60.1  mm     G. Age:  35w 0d         22  %

 Est. FW:    5567  gm      6 lb 9 oz     34  %
OB History

 Gravidity:    1
Gestational Age

 LMP:           37w 4d        Date:  12/07/19                 EDD:   09/12/20
 U/S Today:     36w 3d                                        EDD:   09/20/20
 Best:          37w 4d     Det. By:  LMP  (12/07/19)          EDD:   09/12/20
Anatomy

 Cranium:               Appears normal         Aortic Arch:            Previously seen
 Cavum:                 Appears normal         Ductal Arch:            Appears normal
 Ventricles:            Appears normal         Diaphragm:              Appears normal
 Choroid Plexus:        Appears normal         Stomach:                Appears normal, left
                                                                       sided
 Cerebellum:            Appears normal         Abdomen:                Appears normal
 Posterior Fossa:       Appears normal         Abdominal Wall:         Previously seen
 Nuchal Fold:           Not applicable (>20    Cord Vessels:           Previously seen
                        wks GA)
 Face:                  Orbits and profile     Kidneys:                Appear normal
                        previously seen
 Lips:                  Not well visualized    Bladder:                Appears normal
 Thoracic:              Appears normal         Spine:                  Previously seen
 Heart:                 Appears normal         Upper Extremities:      Previously seen
                        (4CH, axis, and
                        situs)
 RVOT:                  Appears normal         Lower Extremities:      Previously seen
 LVOT:                  Appears normal

 Other:  Technically difficult due to advanced GA, maternal habitus, and fetal
         position.
Cervix Uterus Adnexa

 Cervix
 Not visualized (advanced GA >60wks)

 Uterus
 No abnormality visualized.

 Right Ovary
 Not visualized. No adnexal mass visualized.

 Left Ovary
 Dermoid measuring 2.3 x 2.4x 2.5 cm
 Adnexa
 No abnormality visualized.
Impression

 Gestational diabetes. Well-controlled on diet .
 Amniotic fluid is normal and good fetal activity is seen.Fetal
 growth is appropriate for gestational age .
Recommendations

 Follow-up scans as clinically indicated.
                 Tiger, [place]

## 2022-09-08 ENCOUNTER — Other Ambulatory Visit: Payer: Self-pay | Admitting: Adult Health

## 2022-11-10 ENCOUNTER — Telehealth: Payer: Self-pay

## 2022-11-10 ENCOUNTER — Ambulatory Visit: Payer: Medicaid Other | Admitting: Family Medicine

## 2022-11-10 ENCOUNTER — Telehealth: Payer: Self-pay | Admitting: Physician Assistant

## 2022-11-10 DIAGNOSIS — H66001 Acute suppurative otitis media without spontaneous rupture of ear drum, right ear: Secondary | ICD-10-CM

## 2022-11-10 DIAGNOSIS — B9689 Other specified bacterial agents as the cause of diseases classified elsewhere: Secondary | ICD-10-CM

## 2022-11-10 DIAGNOSIS — H1013 Acute atopic conjunctivitis, bilateral: Secondary | ICD-10-CM

## 2022-11-10 DIAGNOSIS — J019 Acute sinusitis, unspecified: Secondary | ICD-10-CM

## 2022-11-10 MED ORDER — AZITHROMYCIN 250 MG PO TABS
ORAL_TABLET | ORAL | 0 refills | Status: AC
Start: 2022-11-10 — End: 2022-11-15

## 2022-11-10 MED ORDER — CIPROFLOXACIN-DEXAMETHASONE 0.3-0.1 % OT SUSP: 4.0000 [drp] | Freq: Two times a day (BID) | OTIC | 0 refills | Status: DC

## 2022-11-10 NOTE — Progress Notes (Signed)
Virtual Visit Consent   Shannon Bentley, you are scheduled for a virtual visit with a [facility] provider today. Just as with appointments in the office, your consent must be obtained to participate. Your consent will be active for this visit and any virtual visit you may have with one of our providers in the next 365 days. If you have a MyChart account, a copy of this consent can be sent to you electronically.  As this is a virtual visit, video technology does not allow for your provider to perform a traditional examination. This may limit your provider's ability to fully assess your condition. If your provider identifies any concerns that need to be evaluated in person or the need to arrange testing (such as labs, EKG, etc.), we will make arrangements to do so. Although advances in technology are sophisticated, we cannot ensure that it will always work on either your end or our end. If the connection with a video visit is poor, the visit may have to be switched to a telephone visit. With either a video or telephone visit, we are not always able to ensure that we have a secure connection.  By engaging in this virtual visit, you consent to the provision of healthcare and authorize for your insurance to be billed (if applicable) for the services provided during this visit. Depending on your insurance coverage, you may receive a charge related to this service.  I need to obtain your verbal consent now. Are you willing to proceed with your visit today? Shannon Bentley has provided verbal consent on 11/10/2022 for a virtual visit (video or telephone). Shannon Loveless, PA-C  Date: 11/10/2022 8:23 AM  Virtual Visit via Video Note   I, Shannon Bentley, connected with  Shannon Bentley  ([id number], Apr 13, 1990) on 11/10/22 at  8:15 AM EDT by a video-enabled telemedicine application and verified that I am speaking with the correct person using two identifiers.  Location: Patient: Virtual Visit Location  Patient: Home Provider: Virtual Visit Location Provider: Home Office   I discussed the limitations of evaluation and management by telemedicine and the availability of in person appointments. The patient expressed understanding and agreed to proceed.    History of Present Illness: Shannon Bentley is a 32 y.o. who identifies as a female who was assigned female at birth, and is being seen today for ear pain and sinus congestion.  HPI: Sinusitis This is a new problem. The current episode started 1 to 4 weeks ago. The problem has been gradually worsening since onset. There has been no fever. Associated symptoms include congestion, ear pain (right), headaches and sinus pressure. Pertinent negatives include no chills, coughing, diaphoresis, hoarse voice or sore throat. (Eye watering, post nasal drainage) Treatments tried: nasacort, flonase. antihistamine.     Problems:  Patient Active Problem List   Diagnosis Date Noted   Crohn's disease of both small and large intestine without complication (HCC) 11/18/2021   Crohn's disease (HCC) 08/18/2021   Eczema 08/18/2021   Carpal tunnel syndrome, bilateral 08/18/2021   Abnormal Pap smear of cervix 05/17/2020   Left ovarian cyst 04/13/2020   Abnormal chromosomal and genetic finding on antenatal screening mother 03/29/2020   History of gestational diabetes 03/23/2020   Vitamin D deficiency 05/02/2018   Morbid obesity (HCC) 05/02/2018    Allergies:  Allergies  Allergen Reactions   Amoxicillin-Pot Clavulanate Hives   Bactrim [Sulfamethoxazole-Trimethoprim] Hives   Benadryl [Diphenhydramine] Hives   Dilaudid [Hydromorphone Hcl] Hives   Medications:  Current Outpatient Medications:  azithromycin (ZITHROMAX) 250 MG tablet, Take 2 tablets on day 1, then 1 tablet daily on days 2 through 5, Disp: 6 tablet, Rfl: 0   Ascorbic Acid (VITAMIN C PO), Take by mouth., Disp: , Rfl:    CALCIUM PO, Take by mouth., Disp: , Rfl:    cetirizine (ZYRTEC ALLERGY) 10  MG tablet, Take 1 tablet (10 mg total) by mouth at bedtime., Disp: 30 tablet, Rfl: 2   Chlorphen-PE-Acetaminophen 4-10-325 MG TABS, Take 1 tablet by mouth every 6 (six) hours as needed., Disp: 60 tablet, Rfl: 0   Cholecalciferol (VITAMIN D3 PO), Take by mouth., Disp: , Rfl:    ciprofloxacin-dexamethasone (CIPRODEX) OTIC suspension, Place 4 drops into the right ear 2 (two) times daily. X 7 days, Disp: 7.5 mL, Rfl: 0   clobetasol cream (TEMOVATE) 0.05 %, Apply 1 Application topically 2 (two) times daily., Disp: 30 g, Rfl: 6   ELDERBERRY PO, Take by mouth., Disp: , Rfl:    fluticasone (FLONASE) 50 MCG/ACT nasal spray, Place 2 sprays into both nostrils daily., Disp: 16 g, Rfl: 0   Levocetirizine Dihydrochloride (XYZAL PO), Take by mouth., Disp: , Rfl:    meloxicam (MOBIC) 7.5 MG tablet, Take 7.5 mg by mouth 2 (two) times daily., Disp: , Rfl:    methocarbamol (ROBAXIN) 500 MG tablet, Take 500 mg by mouth 2 (two) times daily., Disp: , Rfl:    Multiple Vitamin (MULTIVITAMIN) tablet, Take 1 tablet by mouth daily., Disp: , Rfl:    nitrofurantoin, macrocrystal-monohydrate, (MACROBID) 100 MG capsule, Take 1 capsule (100 mg total) by mouth 2 (two) times daily., Disp: 10 capsule, Rfl: 0   Norethindrone-Ethinyl Estradiol-Fe Biphas (LO LOESTRIN FE) 1 MG-10 MCG / 10 MCG tablet, TAKE 1 TABLET BY MOUTH EVERY DAY, Disp: 84 tablet, Rfl: 1  Observations/Objective: Patient is well-developed, well-nourished in no acute distress.  Resting comfortably at home.  Head is normocephalic, atraumatic.  No labored breathing.  Speech is clear and coherent with logical content.  Patient is alert and oriented at baseline.    Assessment and Plan: 1. Non-recurrent acute suppurative otitis media of right ear without spontaneous rupture of tympanic membrane - ciprofloxacin-dexamethasone (CIPRODEX) OTIC suspension; Place 4 drops into the right ear 2 (two) times daily. X 7 days  Dispense: 7.5 mL; Refill: 0  2. Acute bacterial  sinusitis - azithromycin (ZITHROMAX) 250 MG tablet; Take 2 tablets on day 1, then 1 tablet daily on days 2 through 5  Dispense: 6 tablet; Refill: 0  3. Allergic conjunctivitis of both eyes  - Worsening symptoms that have not responded to OTC medications.  - Will give Zpack and Ciprodex - Discussed olopatadine for allergic conjunctivitis but patient politely declined - Continue saline nasal rinses - Continue Flonase (Fluticasone) nasal spray over the counter for possible eustachian tube dysfunction - Steam and humidifier can help - Warm compress to ear - Stay well hydrated and get plenty of rest.  - Seek in person evaluation if no symptom improvement or if symptoms worsen   Follow Up Instructions: I discussed the assessment and treatment plan with the patient. The patient was provided an opportunity to ask questions and all were answered. The patient agreed with the plan and demonstrated an understanding of the instructions.  A copy of instructions were sent to the patient via MyChart unless otherwise noted below.    The patient was advised to call back or seek an in-person evaluation if the symptoms worsen or if the condition fails to improve as anticipated.  Time:  I spent 10 minutes with the patient via telehealth technology discussing the above problems/concerns.    Shannon Loveless, PA-C

## 2022-11-10 NOTE — Patient Instructions (Signed)
[name], thank you for joining [name], PA-C for today's virtual visit.  While this provider is not your primary care provider (PCP), if your PCP is located in our provider database this encounter information will be shared with them immediately following your visit.   A [facility] MyChart account gives you access to today's visit and all your visits, tests, and labs performed at [facility] " click here if you don't have a [facility] MyChart account or go to [email or web address]  Consent: (Patient) Shannon Bentley provided verbal consent for this virtual visit at the beginning of the encounter.  Current Medications:  Current Outpatient Medications:    azithromycin (ZITHROMAX) 250 MG tablet, Take 2 tablets on day 1, then 1 tablet daily on days 2 through 5, Disp: 6 tablet, Rfl: 0   Ascorbic Acid (VITAMIN C PO), Take by mouth., Disp: , Rfl:    CALCIUM PO, Take by mouth., Disp: , Rfl:    cetirizine (ZYRTEC ALLERGY) 10 MG tablet, Take 1 tablet (10 mg total) by mouth at bedtime., Disp: 30 tablet, Rfl: 2   Chlorphen-PE-Acetaminophen 4-10-325 MG TABS, Take 1 tablet by mouth every 6 (six) hours as needed., Disp: 60 tablet, Rfl: 0   Cholecalciferol (VITAMIN D3 PO), Take by mouth., Disp: , Rfl:    ciprofloxacin-dexamethasone (CIPRODEX) OTIC suspension, Place 4 drops into the right ear 2 (two) times daily. X 7 days, Disp: 7.5 mL, Rfl: 0   clobetasol cream (TEMOVATE) 0.05 %, Apply 1 Application topically 2 (two) times daily., Disp: 30 g, Rfl: 6   ELDERBERRY PO, Take by mouth., Disp: , Rfl:    fluticasone (FLONASE) 50 MCG/ACT nasal spray, Place 2 sprays into both nostrils daily., Disp: 16 g, Rfl: 0   Levocetirizine Dihydrochloride (XYZAL PO), Take by mouth., Disp: , Rfl:    meloxicam (MOBIC) 7.5 MG tablet, Take 7.5 mg by mouth 2 (two) times daily., Disp: , Rfl:    methocarbamol (ROBAXIN) 500 MG tablet, Take 500 mg by mouth 2 (two) times daily., Disp: , Rfl:     Multiple Vitamin (MULTIVITAMIN) tablet, Take 1 tablet by mouth daily., Disp: , Rfl:    nitrofurantoin, macrocrystal-monohydrate, (MACROBID) 100 MG capsule, Take 1 capsule (100 mg total) by mouth 2 (two) times daily., Disp: 10 capsule, Rfl: 0   Norethindrone-Ethinyl Estradiol-Fe Biphas (LO LOESTRIN FE) 1 MG-10 MCG / 10 MCG tablet, TAKE 1 TABLET BY MOUTH EVERY DAY, Disp: 84 tablet, Rfl: 1   Medications ordered in this encounter:  Meds ordered this encounter  Medications   ciprofloxacin-dexamethasone (CIPRODEX) OTIC suspension    Sig: Place 4 drops into the right ear 2 (two) times daily. X 7 days    Dispense:  7.5 mL    Refill:  0    Order Specific Question:   Supervising Provider    Answer:   Merrilee Jansky [[id number]]   azithromycin (ZITHROMAX) 250 MG tablet    Sig: Take 2 tablets on day 1, then 1 tablet daily on days 2 through 5    Dispense:  6 tablet    Refill:  0    Order Specific Question:   Supervising Provider    Answer:   Merrilee Jansky [[id number]]     *If you need refills on other medications prior to your next appointment, please contact your pharmacy*  Follow-Up: Call back or seek an in-person evaluation if the symptoms worsen or if the condition fails to improve as anticipated.  [facility] [phone]  Other Instructions Otitis Media, Adult  Otitis media occurs when there is inflammation and fluid in the middle ear with signs and symptoms of an acute infection. The middle ear is a part of the ear that contains bones for hearing as well as air that helps send sounds to the brain. When infected fluid builds up in this space, it causes pressure and can lead to an ear infection. The eustachian tube connects the middle ear to the back of the nose (nasopharynx) and normally allows air into the middle ear. If the eustachian tube becomes blocked, fluid can build up and become infected. What are the causes? This condition is caused by a blockage in the eustachian  tube. This can be caused by mucus or by swelling of the tube. Problems that can cause a blockage include: A cold or other upper respiratory infection. Allergies. An irritant, such as tobacco smoke. Enlarged adenoids. The adenoids are areas of soft tissue located high in the back of the throat, behind the nose and the roof of the mouth. They are part of the body's defense system (immune system). A mass in the nasopharynx. Damage to the ear caused by pressure changes (barotrauma). What increases the risk? You are more likely to develop this condition if you: Smoke or are exposed to tobacco smoke. Have an opening in the roof of your mouth (cleft palate). Have gastroesophageal reflux. Have an immune system disorder. What are the signs or symptoms? Symptoms of this condition include: Ear pain. Fever. Decreased hearing. Tiredness (lethargy). Fluid leaking from the ear, if the eardrum is ruptured or has burst. Ringing in the ear. How is this diagnosed?  This condition is diagnosed with a physical exam. During the exam, your health care provider will use an instrument called an otoscope to look in your ear and check for redness, swelling, and fluid. He or she will also ask about your symptoms. Your health care provider may also order tests, such as: A pneumatic otoscopy. This is a test to check the movement of the eardrum. It is done by squeezing a small amount of air into the ear. A tympanogram. This is a test that shows how well the eardrum moves in response to air pressure in the ear canal. It provides a graph for your health care provider to review. How is this treated? This condition can go away on its own within 3-5 days. But if the condition is caused by a bacterial infection and does not go away on its own, or if it keeps coming back, your health care provider may: Prescribe antibiotic medicine to treat the infection. Prescribe or recommend medicines to control pain. Follow these  instructions at home: Take over-the-counter and prescription medicines only as told by your health care provider. If you were prescribed an antibiotic medicine, take it as told by your health care provider. Do not stop taking the antibiotic even if you start to feel better. Keep all follow-up visits. This is important. Contact a health care provider if: You have bleeding from your nose. There is a lump on your neck. You are not feeling better in 5 days. You feel worse instead of better. Get help right away if: You have severe pain that is not controlled with medicine. You have swelling, redness, or pain around your ear. You have stiffness in your neck. A part of your face is not moving (paralyzed). The bone behind your ear (mastoid bone) is tender when you touch it. You develop a severe headache.  Summary Otitis media is redness, soreness, and swelling of the middle ear, usually resulting in pain and decreased hearing. This condition can go away on its own within 3-5 days. If the problem does not go away in 3-5 days, your health care provider may give you medicines to treat the infection. If you were prescribed an antibiotic medicine, take it as told by your health care provider. Follow all instructions that were given to you by your health care provider. This information is not intended to replace advice given to you by your health care provider. Make sure you discuss any questions you have with your health care provider. Document Revised: 05/17/2020 Document Reviewed: 05/17/2020 Elsevier Patient Education  2024 Elsevier Inc.    If you have been instructed to have an in-person evaluation today at a local Urgent Care facility, please use the link below. It will take you to a list of all of our available [facility], including address, phone number and hours of operation. Please do not delay care.  [facility]  If you or a family member do not have a primary  care provider, use the link below to schedule a visit and establish care. When you choose a [facility] primary care physician or advanced practice provider, you gain a long-term partner in health. Find a Primary Care Provider  Learn more about [facility]'s in-office and virtual care options: [facility] - Get Care Now

## 2022-11-21 ENCOUNTER — Ambulatory Visit: Payer: Medicaid Other | Admitting: Family Medicine

## 2022-11-23 ENCOUNTER — Encounter: Payer: Self-pay | Admitting: Family Medicine

## 2022-11-23 ENCOUNTER — Ambulatory Visit: Payer: Medicaid Other | Admitting: Family Medicine

## 2023-01-22 ENCOUNTER — Telehealth: Payer: Self-pay | Admitting: Physician Assistant

## 2023-01-22 DIAGNOSIS — H6991 Unspecified Eustachian tube disorder, right ear: Secondary | ICD-10-CM

## 2023-01-22 MED ORDER — FLUTICASONE PROPIONATE 50 MCG/ACT NA SUSP
2.0000 | Freq: Every day | NASAL | 0 refills | Status: DC
Start: 2023-01-22 — End: 2023-05-22

## 2023-01-22 NOTE — Progress Notes (Signed)

## 2023-01-23 ENCOUNTER — Telehealth: Payer: Self-pay | Admitting: Nurse Practitioner

## 2023-01-23 DIAGNOSIS — H65191 Other acute nonsuppurative otitis media, right ear: Secondary | ICD-10-CM

## 2023-01-23 DIAGNOSIS — J0100 Acute maxillary sinusitis, unspecified: Secondary | ICD-10-CM

## 2023-01-23 MED ORDER — FLUTICASONE PROPIONATE 50 MCG/ACT NA SUSP
2.0000 | Freq: Every day | NASAL | 6 refills | Status: DC
Start: 2023-01-23 — End: 2023-05-22

## 2023-01-23 MED ORDER — DOXYCYCLINE HYCLATE 100 MG PO TABS
100.0000 mg | ORAL_TABLET | Freq: Two times a day (BID) | ORAL | 0 refills | Status: AC
Start: 2023-01-23 — End: 2023-02-02

## 2023-01-23 NOTE — Progress Notes (Signed)
Virtual Visit Consent   [name], you are scheduled for a virtual visit with a [facility] provider today. Just as with appointments in the office, your consent must be obtained to participate. Your consent will be active for this visit and any virtual visit you may have with one of our providers in the next 365 days. If you have a MyChart account, a copy of this consent can be sent to you electronically.  As this is a virtual visit, video technology does not allow for your provider to perform a traditional examination. This may limit your provider's ability to fully assess your condition. If your provider identifies any concerns that need to be evaluated in person or the need to arrange testing (such as labs, EKG, etc.), we will make arrangements to do so. Although advances in technology are sophisticated, we cannot ensure that it will always work on either your end or our end. If the connection with a video visit is poor, the visit may have to be switched to a telephone visit. With either a video or telephone visit, we are not always able to ensure that we have a secure connection.  By engaging in this virtual visit, you consent to the provision of healthcare and authorize for your insurance to be billed (if applicable) for the services provided during this visit. Depending on your insurance coverage, you may receive a charge related to this service.  I need to obtain your verbal consent now. Are you willing to proceed with your visit today? Shannon Bentley has provided verbal consent on 01/23/2023 for a virtual visit (video or telephone). Shannon Simas, FNP  Date: 01/23/2023 10:04 AM  Virtual Visit via Video Note   I, Shannon Bentley, connected with  Shannon Bentley  ([id number], 05-30-1990) on 01/23/23 at 10:00 AM EST by a video-enabled telemedicine application and verified that I am speaking with the correct person using two identifiers.  Location: Patient: Virtual Visit Location Patient:  Home Provider: Virtual Visit Location Provider: Home Office   I discussed the limitations of evaluation and management by telemedicine and the availability of in person appointments. The patient expressed understanding and agreed to proceed.    History of Present Illness: Shannon Bentley is a 32 y.o. who identifies as a female who was assigned female at birth, and is being seen today for an earache and sinus congestion  She has pain into her right maxillary sinus today as well   She has chronic allergies and sinusitis but in the past 48 hours has had worsening symptoms  Was last treated for a sinus infection in September with Azithromycin   She has been taking : Zyrtec twice daily  She has also tried Theraflu for relief  She has also tried sudafed which is the only thing that has provided her temporary relief  Also using Vicks Vapor rub  She does not use nasal sprays   Feels the over the counter regimens have been exhausted and symptoms are persistent and worsening   Problems:  Patient Active Problem List   Diagnosis Date Noted   Crohn's disease of both small and large intestine without complication (HCC) 11/18/2021   Crohn's disease (HCC) 08/18/2021   Eczema 08/18/2021   Carpal tunnel syndrome, bilateral 08/18/2021   Abnormal Pap smear of cervix 05/17/2020   Left ovarian cyst 04/13/2020   Abnormal chromosomal and genetic finding on antenatal screening mother 03/29/2020   History of gestational diabetes 03/23/2020   Vitamin D deficiency 05/02/2018   Morbid obesity (  HCC) 05/02/2018    Allergies:  Allergies  Allergen Reactions   Amoxicillin-Pot Clavulanate Hives   Bactrim [Sulfamethoxazole-Trimethoprim] Hives   Benadryl [Diphenhydramine] Hives   Dilaudid [Hydromorphone Hcl] Hives   Medications:  Current Outpatient Medications:    Ascorbic Acid (VITAMIN C PO), Take by mouth., Disp: , Rfl:    CALCIUM PO, Take by mouth., Disp: , Rfl:    cetirizine (ZYRTEC ALLERGY) 10 MG  tablet, Take 1 tablet (10 mg total) by mouth at bedtime., Disp: 30 tablet, Rfl: 2   Chlorphen-PE-Acetaminophen 4-10-325 MG TABS, Take 1 tablet by mouth every 6 (six) hours as needed., Disp: 60 tablet, Rfl: 0   Cholecalciferol (VITAMIN D3 PO), Take by mouth., Disp: , Rfl:    clobetasol cream (TEMOVATE) 0.05 %, Apply 1 Application topically 2 (two) times daily., Disp: 30 g, Rfl: 6   ELDERBERRY PO, Take by mouth., Disp: , Rfl:    fluticasone (FLONASE) 50 MCG/ACT nasal spray, Place 2 sprays into both nostrils daily., Disp: 16 g, Rfl: 0   Levocetirizine Dihydrochloride (XYZAL PO), Take by mouth., Disp: , Rfl:    meloxicam (MOBIC) 7.5 MG tablet, Take 7.5 mg by mouth 2 (two) times daily., Disp: , Rfl:    methocarbamol (ROBAXIN) 500 MG tablet, Take 500 mg by mouth 2 (two) times daily., Disp: , Rfl:    Multiple Vitamin (MULTIVITAMIN) tablet, Take 1 tablet by mouth daily., Disp: , Rfl:    nitrofurantoin, macrocrystal-monohydrate, (MACROBID) 100 MG capsule, Take 1 capsule (100 mg total) by mouth 2 (two) times daily., Disp: 10 capsule, Rfl: 0   Norethindrone-Ethinyl Estradiol-Fe Biphas (LO LOESTRIN FE) 1 MG-10 MCG / 10 MCG tablet, TAKE 1 TABLET BY MOUTH EVERY DAY, Disp: 84 tablet, Rfl: 1  Observations/Objective: Patient is well-developed, well-nourished in no acute distress.  Resting comfortably  at home.  Head is normocephalic, atraumatic.  No labored breathing.  Speech is clear and coherent with logical content.  Patient is alert and oriented at baseline.    Assessment and Plan:  1. Acute non-recurrent maxillary sinusitis  Continue allergy regimen and add Flonase   - fluticasone (FLONASE) 50 MCG/ACT nasal spray; Place 2 sprays into both nostrils daily.  Dispense: 16 g; Refill: 6 - doxycycline (VIBRA-TABS) 100 MG tablet; Take 1 tablet (100 mg total) by mouth 2 (two) times daily for 10 days.  Dispense: 20 tablet; Refill: 0  2. Other non-recurrent acute nonsuppurative otitis media of right ear  -  doxycycline (VIBRA-TABS) 100 MG tablet; Take 1 tablet (100 mg total) by mouth 2 (two) times daily for 10 days.  Dispense: 20 tablet; Refill: 0    Follow Up Instructions: I discussed the assessment and treatment plan with the patient. The patient was provided an opportunity to ask questions and all were answered. The patient agreed with the plan and demonstrated an understanding of the instructions.  A copy of instructions were sent to the patient via MyChart unless otherwise noted below.    The patient was advised to call back or seek an in-person evaluation if the symptoms worsen or if the condition fails to improve as anticipated.    Shannon Simas, FNP

## 2023-03-16 ENCOUNTER — Ambulatory Visit: Payer: Medicaid Other | Admitting: Family Medicine

## 2023-03-20 ENCOUNTER — Encounter: Payer: Self-pay | Admitting: Family Medicine

## 2023-04-05 ENCOUNTER — Ambulatory Visit: Payer: Medicaid Other | Admitting: Family Medicine

## 2023-04-05 ENCOUNTER — Encounter: Payer: Self-pay | Admitting: Family Medicine

## 2023-05-20 ENCOUNTER — Encounter: Admitting: Nurse Practitioner

## 2023-05-20 ENCOUNTER — Telehealth: Payer: Self-pay | Admitting: Nurse Practitioner

## 2023-05-20 DIAGNOSIS — R112 Nausea with vomiting, unspecified: Secondary | ICD-10-CM

## 2023-05-20 MED ORDER — ONDANSETRON HCL 4 MG PO TABS: 4.0000 mg | ORAL_TABLET | Freq: Three times a day (TID) | ORAL | 0 refills | Status: DC | PRN

## 2023-05-20 NOTE — Progress Notes (Signed)
 Virtual Visit Consent   Shannon Bentley, you are scheduled for a virtual visit with a  provider today. Just as with appointments in the office, your consent must be obtained to participate. Your consent will be active for this visit and any virtual visit you may have with one of our providers in the next 365 days. If you have a MyChart account, a copy of this consent can be sent to you electronically.  As this is a virtual visit, video technology does not allow for your provider to perform a traditional examination. This may limit your provider's ability to fully assess your condition. If your provider identifies any concerns that need to be evaluated in person or the need to arrange testing (such as labs, EKG, etc.), we will make arrangements to do so. Although advances in technology are sophisticated, we cannot ensure that it will always work on either your end or our end. If the connection with a video visit is poor, the visit may have to be switched to a telephone visit. With either a video or telephone visit, we are not always able to ensure that we have a secure connection.  By engaging in this virtual visit, you consent to the provision of healthcare and authorize for your insurance to be billed (if applicable) for the services provided during this visit. Depending on your insurance coverage, you may receive a charge related to this service.  I need to obtain your verbal consent now. Are you willing to proceed with your visit today? Shannon Bentley has provided verbal consent on 05/20/2023 for a virtual visit (video or telephone). [name], NP  Date: 05/20/2023 7:03 PM   Virtual Visit via Video Note   I, [name], connected with  Bryttany Tortorelli  ([id number], [birth year]) on 05/20/23 at  7:00 PM EDT by a video-enabled telemedicine application and verified that I am speaking with the correct person using two identifiers.  Location: Patient: Virtual Visit Location Patient:  Home Provider: Virtual Visit Location Provider: Home Office   I discussed the limitations of evaluation and management by telemedicine and the availability of in person appointments. The patient expressed understanding and agreed to proceed.    History of Present Illness: Shannon Bentley is a 33 y.o. who identifies as a female who was assigned female at birth, and is being seen today for Nausea and vomiting.  Last night [name] when out to eat with her family and over the past 24 hours she hs been experiencing nausea, vomiting and diarrhea. Taking kaopectate currently with little to no relief of symptoms. Denies fever, blood or pus in stool or recent travel. Experiencing chills. Temp 97.6.    Problems:  Patient Active Problem List   Diagnosis Date Noted   Crohn's disease of both small and large intestine without complication (HCC) 11/18/2021   Crohn's disease (HCC) 08/18/2021   Eczema 08/18/2021   Carpal tunnel syndrome, bilateral 08/18/2021   Abnormal Pap smear of cervix 05/17/2020   Left ovarian cyst 04/13/2020   Abnormal chromosomal and genetic finding on antenatal screening mother 03/29/2020   History of gestational diabetes 03/23/2020   Vitamin D deficiency 05/02/2018   Morbid obesity (HCC) 05/02/2018    Allergies:  Allergies  Allergen Reactions   Amoxicillin-Pot Clavulanate Hives   Bactrim [Sulfamethoxazole-Trimethoprim] Hives   Benadryl [Diphenhydramine] Hives   Dilaudid [Hydromorphone Hcl] Hives   Medications:  Current Outpatient Medications:    ondansetron (ZOFRAN) 4 MG tablet, Take 1 tablet (4 mg total) by  mouth every 8 (eight) hours as needed for nausea or vomiting., Disp: 30 tablet, Rfl: 0   Ascorbic Acid (VITAMIN C PO), Take by mouth., Disp: , Rfl:    CALCIUM PO, Take by mouth., Disp: , Rfl:    cetirizine (ZYRTEC ALLERGY) 10 MG tablet, Take 1 tablet (10 mg total) by mouth at bedtime., Disp: 30 tablet, Rfl: 2   Chlorphen-PE-Acetaminophen 4-10-325 MG TABS, Take 1  tablet by mouth every 6 (six) hours as needed., Disp: 60 tablet, Rfl: 0   Cholecalciferol (VITAMIN D3 PO), Take by mouth., Disp: , Rfl:    clobetasol cream (TEMOVATE) 0.05 %, Apply 1 Application topically 2 (two) times daily., Disp: 30 g, Rfl: 6   ELDERBERRY PO, Take by mouth., Disp: , Rfl:    fluticasone (FLONASE) 50 MCG/ACT nasal spray, Place 2 sprays into both nostrils daily., Disp: 16 g, Rfl: 0   fluticasone (FLONASE) 50 MCG/ACT nasal spray, Place 2 sprays into both nostrils daily., Disp: 16 g, Rfl: 6   Levocetirizine Dihydrochloride (XYZAL PO), Take by mouth., Disp: , Rfl:    meloxicam (MOBIC) 7.5 MG tablet, Take 7.5 mg by mouth 2 (two) times daily., Disp: , Rfl:    methocarbamol (ROBAXIN) 500 MG tablet, Take 500 mg by mouth 2 (two) times daily., Disp: , Rfl:    Multiple Vitamin (MULTIVITAMIN) tablet, Take 1 tablet by mouth daily., Disp: , Rfl:    nitrofurantoin, macrocrystal-monohydrate, (MACROBID) 100 MG capsule, Take 1 capsule (100 mg total) by mouth 2 (two) times daily., Disp: 10 capsule, Rfl: 0   Norethindrone-Ethinyl Estradiol-Fe Biphas (LO LOESTRIN FE) 1 MG-10 MCG / 10 MCG tablet, TAKE 1 TABLET BY MOUTH EVERY DAY, Disp: 84 tablet, Rfl: 1  Observations/Objective: Patient is well-developed, well-nourished in no acute distress.  Resting comfortably at home.  Head is normocephalic, atraumatic.  No labored breathing.  Speech is clear and coherent with logical content.  Patient is alert and oriented at baseline.    Assessment and Plan: 1. Nausea and vomiting, unspecified vomiting type (Primary) - ondansetron (ZOFRAN) 4 MG tablet; Take 1 tablet (4 mg total) by mouth every 8 (eight) hours as needed for nausea or vomiting.  Dispense: 30 tablet; Refill: 0 BRAT diet for 24 hours the advance  Follow Up Instructions: I discussed the assessment and treatment plan with the patient. The patient was provided an opportunity to ask questions and all were answered. The patient agreed with the  plan and demonstrated an understanding of the instructions.  A copy of instructions were sent to the patient via MyChart unless otherwise noted below.    The patient was advised to call back or seek an in-person evaluation if the symptoms worsen or if the condition fails to improve as anticipated.    [name], NP

## 2023-05-20 NOTE — Progress Notes (Signed)
 Duplicate. VV completed earlier

## 2023-05-20 NOTE — Patient Instructions (Signed)
 Shannon Bentley, thank you for joining Shannon Rigg, NP for today's virtual visit.  While this provider is not your primary care provider (PCP), if your PCP is located in our provider database this encounter information will be shared with them immediately following your visit.   A Shannon Bentley MyChart account gives you access to today's visit and all your visits, tests, and labs performed at [facility] " click here if you don't have a Shannon Bentley MyChart account or go to [email or web address]  Consent: (Patient) Shannon Bentley provided verbal consent for this virtual visit at the beginning of the encounter.  Current Medications:  Current Outpatient Medications:    ondansetron (ZOFRAN) 4 MG tablet, Take 1 tablet (4 mg total) by mouth every 8 (eight) hours as needed for nausea or vomiting., Disp: 30 tablet, Rfl: 0   Ascorbic Acid (VITAMIN C PO), Take by mouth., Disp: , Rfl:    CALCIUM PO, Take by mouth., Disp: , Rfl:    cetirizine (ZYRTEC ALLERGY) 10 MG tablet, Take 1 tablet (10 mg total) by mouth at bedtime., Disp: 30 tablet, Rfl: 2   Chlorphen-PE-Acetaminophen 4-10-325 MG TABS, Take 1 tablet by mouth every 6 (six) hours as needed., Disp: 60 tablet, Rfl: 0   Cholecalciferol (VITAMIN D3 PO), Take by mouth., Disp: , Rfl:    clobetasol cream (TEMOVATE) 0.05 %, Apply 1 Application topically 2 (two) times daily., Disp: 30 g, Rfl: 6   ELDERBERRY PO, Take by mouth., Disp: , Rfl:    fluticasone (FLONASE) 50 MCG/ACT nasal spray, Place 2 sprays into both nostrils daily., Disp: 16 g, Rfl: 0   fluticasone (FLONASE) 50 MCG/ACT nasal spray, Place 2 sprays into both nostrils daily., Disp: 16 g, Rfl: 6   Levocetirizine Dihydrochloride (XYZAL PO), Take by mouth., Disp: , Rfl:    meloxicam (MOBIC) 7.5 MG tablet, Take 7.5 mg by mouth 2 (two) times daily., Disp: , Rfl:    methocarbamol (ROBAXIN) 500 MG tablet, Take 500 mg by mouth 2 (two) times daily., Disp: , Rfl:    Multiple Vitamin  (MULTIVITAMIN) tablet, Take 1 tablet by mouth daily., Disp: , Rfl:    nitrofurantoin, macrocrystal-monohydrate, (MACROBID) 100 MG capsule, Take 1 capsule (100 mg total) by mouth 2 (two) times daily., Disp: 10 capsule, Rfl: 0   Norethindrone-Ethinyl Estradiol-Fe Biphas (LO LOESTRIN FE) 1 MG-10 MCG / 10 MCG tablet, TAKE 1 TABLET BY MOUTH EVERY DAY, Disp: 84 tablet, Rfl: 1   Medications ordered in this encounter:  Meds ordered this encounter  Medications   ondansetron (ZOFRAN) 4 MG tablet    Sig: Take 1 tablet (4 mg total) by mouth every 8 (eight) hours as needed for nausea or vomiting.    Dispense:  30 tablet    Refill:  0    Supervising Provider:   Merrilee Bentley [[id number]]     *If you need refills on other medications prior to your next appointment, please contact your pharmacy*  Follow-Up: Call back or seek an in-person evaluation if the symptoms worsen or if the condition fails to improve as anticipated.  [facility] [phone]  Other Instructions BRAT diet for 24 hours and then advance   If you have been instructed to have an in-person evaluation today at a local Urgent Care facility, please use the link below. It will take you to a list of all of our available [facility], including address, phone number and hours of operation. Please do not delay care.  [facility]  If you or a family member do not have a primary care provider, use the link below to schedule a visit and establish care. When you choose a [facility] primary care physician or advanced practice provider, you gain a long-term partner in health. Find a Primary Care Provider  Learn more about [place]'s in-office and virtual care options: [place] - Get Care Now

## 2023-05-22 ENCOUNTER — Telehealth (INDEPENDENT_AMBULATORY_CARE_PROVIDER_SITE_OTHER): Admitting: Family Medicine

## 2023-05-22 ENCOUNTER — Encounter: Payer: Self-pay | Admitting: Family Medicine

## 2023-05-22 DIAGNOSIS — K529 Noninfective gastroenteritis and colitis, unspecified: Secondary | ICD-10-CM

## 2023-05-22 DIAGNOSIS — R197 Diarrhea, unspecified: Secondary | ICD-10-CM

## 2023-05-22 MED ORDER — LOPERAMIDE HCL 2 MG PO TABS
2.0000 mg | ORAL_TABLET | Freq: Four times a day (QID) | ORAL | 0 refills | Status: AC | PRN
Start: 2023-05-22 — End: ?

## 2023-05-22 NOTE — Progress Notes (Signed)
 Virtual Visit via Video   I connected with patient on 05/22/23 at 1040 by a video enabled telemedicine application and verified that I am speaking with the correct person using two identifiers.  Location patient: Home Location provider: [facility] Persons participating in the virtual visit: Patient and Provider  I discussed the limitations of evaluation and management by telemedicine and the availability of in person appointments. The patient expressed understanding and agreed to proceed.  Subjective:   HPI:  Pt presents today for  Chief Complaint  Patient presents with   Diarrhea    History of Present Illness   Shannon Bentley "Shannon Bentley" is a 33 year old female who presents with diarrhea for three days.  She has been experiencing diarrhea for the past three days, which began on Saturday night after dining out. Initially, she thought it was indigestion and took two Rolaids, but the diarrhea persisted and worsened over time. The diarrhea is described as watery and occurs immediately after eating or drinking anything.  She vomited once on Sunday, bringing up chicken that smelled like the meal she had eaten. She is the only person in her household experiencing these symptoms, despite everyone eating the same food.  She has been taking Kaopectate and Rolaids to manage her symptoms. On Sunday, she had an e-visit with a nurse practitioner who prescribed Zofran. Despite this, she continues to feel fatigued and lacks energy.  No blood in her stool. She has not taken any recent antibiotics or been hospitalized recently.       Review of Systems  Constitutional:  Positive for malaise/fatigue.  Gastrointestinal:  Positive for diarrhea and nausea. Negative for abdominal pain, blood in stool, constipation, heartburn, melena and vomiting.  Genitourinary:  Negative for dysuria, flank pain, frequency, hematuria and urgency.  All other systems reviewed and are  negative.    Patient Active Problem List   Diagnosis Date Noted   Crohn's disease of both small and large intestine without complication (HCC) 11/18/2021   Crohn's disease (HCC) 08/18/2021   Eczema 08/18/2021   Carpal tunnel syndrome, bilateral 08/18/2021   Abnormal Pap smear of cervix 05/17/2020   Left ovarian cyst 04/13/2020   Abnormal chromosomal and genetic finding on antenatal screening mother 03/29/2020   History of gestational diabetes 03/23/2020   Vitamin D deficiency 05/02/2018   Morbid obesity (HCC) 05/02/2018    Social History   Tobacco Use   Smoking status: Former    Current packs/day: 0.00    Average packs/day: 0.3 packs/day for 1 year (0.3 ttl pk-yrs)    Types: E-cigarettes, Cigarettes    Start date: 06/28/2014    Quit date: 06/28/2015    Years since quitting: 7.9   Smokeless tobacco: Never   Tobacco comments:    vaped in past  Substance Use Topics   Alcohol use: Not Currently    Comment: rare wine    Current Outpatient Medications:    loperamide (IMODIUM A-D) 2 MG tablet, Take 1 tablet (2 mg total) by mouth 4 (four) times daily as needed for diarrhea or loose stools., Disp: 30 tablet, Rfl: 0   Ascorbic Acid (VITAMIN C PO), Take by mouth., Disp: , Rfl:    CALCIUM PO, Take by mouth., Disp: , Rfl:    cetirizine (ZYRTEC ALLERGY) 10 MG tablet, Take 1 tablet (10 mg total) by mouth at bedtime., Disp: 30 tablet, Rfl: 2   Cholecalciferol (VITAMIN D3 PO), Take by mouth., Disp: , Rfl:    clobetasol cream (TEMOVATE)  0.05 %, Apply 1 Application topically 2 (two) times daily., Disp: 30 g, Rfl: 6   ELDERBERRY PO, Take by mouth., Disp: , Rfl:    meloxicam (MOBIC) 7.5 MG tablet, Take 7.5 mg by mouth 2 (two) times daily., Disp: , Rfl:    methocarbamol (ROBAXIN) 500 MG tablet, Take 500 mg by mouth 2 (two) times daily., Disp: , Rfl:    Multiple Vitamin (MULTIVITAMIN) tablet, Take 1 tablet by mouth daily., Disp: , Rfl:    Norethindrone-Ethinyl Estradiol-Fe Biphas (LO LOESTRIN FE)  1 MG-10 MCG / 10 MCG tablet, TAKE 1 TABLET BY MOUTH EVERY DAY, Disp: 84 tablet, Rfl: 1   ondansetron (ZOFRAN) 4 MG tablet, Take 1 tablet (4 mg total) by mouth every 8 (eight) hours as needed for nausea or vomiting., Disp: 30 tablet, Rfl: 0  Allergies  Allergen Reactions   Amoxicillin-Pot Clavulanate Hives   Bactrim [Sulfamethoxazole-Trimethoprim] Hives   Benadryl [Diphenhydramine] Hives   Dilaudid [Hydromorphone Hcl] Hives    Objective:   There were no vitals taken for this visit.  Patient is well-developed, well-nourished in no acute distress.  Resting comfortably at home.  Head is normocephalic, atraumatic.  No labored breathing.  Speech is clear and coherent with logical content.  Patient is alert and oriented at baseline.    Assessment and Plan:   Shannon "Shannon Bentley" was seen today for diarrhea.  Diagnoses and all orders for this visit:  Gastroenteritis -     loperamide (IMODIUM A-D) 2 MG tablet; Take 1 tablet (2 mg total) by mouth 4 (four) times daily as needed for diarrhea or loose stools.  Diarrhea in adult patient -     loperamide (IMODIUM A-D) 2 MG tablet; Take 1 tablet (2 mg total) by mouth 4 (four) times daily as needed for diarrhea or loose stools.     Acute Gastroenteritis Acute diarrhea for three days postprandial, indicating viral or bacterial gastroenteritis. Symptoms include watery diarrhea without hematochezia and one episode of emesis. No recent antibiotic use or hospitalizations. Significant fatigue and dehydration concerns. Differential includes viral gastroenteritis or foodborne illness. Currently taking Kaopectate and Rolaids, previously prescribed Zofran for nausea. Advised against antidiarrheal use unless symptoms persist beyond five to seven days. Discussed BRAT diet and hydration to ensure adequate urination. Imodium prescribed if symptoms persist. - Advise BRAT diet (bananas, rice, applesauce, toast) to bulk stools. - Encourage hydration with IV hydration  packs and water to ensure urination at least three times daily. - Prescribe Imodium if diarrhea persists beyond five to seven days. - Instruct to report if diarrhea persists beyond seven days or worsens. - Provide a work excuse via Clinical cytogeneticist.          Return if symptoms worsen or fail to improve.  Kari Baars, FNP-C [facility] [address] [place], [place] [address] [phone]  05/22/2023  Time spent with the patient: 12 minutes, of which >50% was spent in obtaining information about symptoms, reviewing previous labs, evaluations, and treatments, counseling about condition (please see the discussed topics above), and developing a plan to further investigate it; had a number of questions which I addressed.

## 2023-05-22 NOTE — Patient Instructions (Signed)
BRAT diet

## 2023-05-26 ENCOUNTER — Encounter: Admitting: Nurse Practitioner

## 2023-05-26 NOTE — Progress Notes (Signed)
 Visit for 33 year old daughter with constipation. Informed ms underberg that we are unable to provide stool softeners or laxatives for 33 year old and also we do not see or treat 2 yr old children and if she were of age the visit would need to be completed under the child's mychart.

## 2023-06-20 ENCOUNTER — Telehealth: Payer: Self-pay

## 2023-06-20 ENCOUNTER — Telehealth: Payer: Self-pay | Admitting: Family Medicine

## 2023-06-20 DIAGNOSIS — K047 Periapical abscess without sinus: Secondary | ICD-10-CM

## 2023-06-20 MED ORDER — CLINDAMYCIN HCL 300 MG PO CAPS: 300.0000 mg | ORAL_CAPSULE | Freq: Three times a day (TID) | ORAL | 0 refills | Status: AC

## 2023-06-20 NOTE — Patient Instructions (Addendum)
 [name], thank you for joining Lanetta Pion, NP for today's virtual visit.  While this provider is not your primary care provider (PCP), if your PCP is located in our provider database this encounter information will be shared with them immediately following your visit.   A [facility] MyChart account gives you access to today's visit and all your visits, tests, and labs performed at [facility] " click here if you don't have a [facility] MyChart account or go to [email or web address]  Consent: (Patient) Shannon Bentley provided verbal consent for this virtual visit at the beginning of the encounter.  Current Medications:  Current Outpatient Medications:    clindamycin (CLEOCIN) 300 MG capsule, Take 1 capsule (300 mg total) by mouth 3 (three) times daily for 7 days., Disp: 21 capsule, Rfl: 0   Ascorbic Acid (VITAMIN C PO), Take by mouth., Disp: , Rfl:    CALCIUM  PO, Take by mouth., Disp: , Rfl:    cetirizine  (ZYRTEC  ALLERGY) 10 MG tablet, Take 1 tablet (10 mg total) by mouth at bedtime., Disp: 30 tablet, Rfl: 2   Cholecalciferol (VITAMIN D3 PO), Take by mouth., Disp: , Rfl:    clobetasol  cream (TEMOVATE ) 0.05 %, Apply 1 Application topically 2 (two) times daily., Disp: 30 g, Rfl: 6   ELDERBERRY PO, Take by mouth., Disp: , Rfl:    loperamide  (IMODIUM  A-D) 2 MG tablet, Take 1 tablet (2 mg total) by mouth 4 (four) times daily as needed for diarrhea or loose stools., Disp: 30 tablet, Rfl: 0   meloxicam (MOBIC) 7.5 MG tablet, Take 7.5 mg by mouth 2 (two) times daily., Disp: , Rfl:    methocarbamol (ROBAXIN) 500 MG tablet, Take 500 mg by mouth 2 (two) times daily., Disp: , Rfl:    Multiple Vitamin (MULTIVITAMIN) tablet, Take 1 tablet by mouth daily., Disp: , Rfl:    Norethindrone-Ethinyl Estradiol-Fe Biphas (LO LOESTRIN FE ) 1 MG-10 MCG / 10 MCG tablet, TAKE 1 TABLET BY MOUTH EVERY DAY, Disp: 84 tablet, Rfl: 1   ondansetron  (ZOFRAN ) 4 MG tablet, Take 1 tablet (4 mg total) by  mouth every 8 (eight) hours as needed for nausea or vomiting., Disp: 30 tablet, Rfl: 0   Medications ordered in this encounter:  Meds ordered this encounter  Medications   clindamycin (CLEOCIN) 300 MG capsule    Sig: Take 1 capsule (300 mg total) by mouth 3 (three) times daily for 7 days.    Dispense:  21 capsule    Refill:  0    Supervising Provider:   Corine Dice [[id number]]     *If you need refills on other medications prior to your next appointment, please contact your pharmacy*  Follow-Up: Call back or seek an in-person evaluation if the symptoms worsen or if the condition fails to improve as anticipated.  [facility] [phone]  Other Instructions  -salt water gargles -complete medication as directed -follow up with dentist given the broken tooth for repair -if not better or getting worse be seen in personDental Abscess  A dental abscess is an area of pus in or around a tooth. It comes from an infection. It can cause pain and other symptoms. Treatment will help with symptoms and prevent the infection from spreading. What are the causes? This condition is caused by an infection in or around the tooth. This can be from: Very bad tooth decay (cavities). A bad injury to the tooth, such as a broken or chipped tooth. What increases the  risk? The risk to get an abscess is higher in males. It is also more likely in people who: Have dental decay. Have very bad gum disease. Eat sugary snacks between meals. Use tobacco. Have diabetes. Have a weak disease-fighting system (immune system). Do not brush their teeth regularly. What are the signs or symptoms? Some mild symptoms are: Tenderness. Bad breath. Fever. A sharp, sour taste in the mouth. Pain in and around the infected tooth. Worse symptoms of this condition include: Swollen neck glands. Chills. Pus draining around the tooth. Swelling and redness around the tooth, the mouth, or the face. Very bad  pain in and around the tooth. The worst symptoms can include: Difficulty swallowing. Difficulty opening your mouth. Feeling like you may vomit or vomiting. How is this treated? This is treated by getting rid of the infection. Your dentist will discuss ways to do this, including: Antibiotic medicines. Antibacterial mouth rinse. An incision in the abscess to drain out the pus. A root canal. Removing the tooth. Follow these instructions at home: Medicines Take over-the-counter and prescription medicines only as told by your dentist. If you were prescribed an antibiotic medicine, take it as told by your dentist. Do not stop taking it even if you start to feel better. If you were prescribed a gel that has numbing medicine in it, use it exactly as told. Ask your dentist if you should avoid driving or using machines while you are taking your medicine. General instructions Rinse your mouth often with salt water. To make salt water, dissolve -1 tsp (3-6 g) of salt in 1 cup (237 mL) of warm water. Eat a soft diet while your mouth is healing. Drink enough fluid to keep your pee (urine) pale yellow. Do not apply heat to the outside of your mouth. Do not smoke or use any products that contain nicotine or tobacco. If you need help quitting, ask your dentist. Keep all follow-up visits. Prevent an abscess Brush your teeth every morning and every night. Use fluoride toothpaste. Floss your teeth each day. Get dental cleanings as often as told by your dentist. Think about getting dental sealant put on teeth that have deep holes (decay). Drink water that has fluoride in it. Most tap water has fluoride. Check the label on bottled water to see if it has fluoride in it. Drink water instead of sugary drinks. Eat healthy meals and snacks. Wear a mouth guard or face shield when you play sports. Contact a doctor if: Your pain is worse and medicine does not help. Get help right away if: You have a fever  or chills. Your symptoms suddenly get worse. You have a very bad headache. You have problems breathing or swallowing. You have trouble opening your mouth. You have swelling in your neck or close to your eye. These symptoms may be an emergency. Get help right away. Call your local emergency services (911 in the [place] Do not wait to see if the symptoms will go away. Do not drive yourself to the hospital. Summary A dental abscess is an area of pus in or around a tooth. It is caused by an infection. Treatment will help with symptoms and prevent the infection from spreading. Take over-the-counter and prescription medicines only as told by your dentist. To prevent an abscess, take good care of your teeth. Brush your teeth every morning and night. Use floss every day. Get dental cleanings as often as told by your dentist. This information is not intended to replace advice given to  you by your health care provider. Make sure you discuss any questions you have with your health care provider. Document Revised: 04/14/2020 Document Reviewed: 04/15/2020 Elsevier Patient Education  2024 [employer]   If you have been instructed to have an in-person evaluation today at a local Urgent Care facility, please use the link below. It will take you to a list of all of our available [facility], including address, phone number and hours of operation. Please do not delay care.  [facility]  If you or a family member do not have a primary care provider, use the link below to schedule a visit and establish care. When you choose a [facility] primary care physician or advanced practice provider, you gain a long-term partner in health. Find a Primary Care Provider  Learn more about [facility]'s in-office and virtual care options:  - Get Care Now

## 2023-06-20 NOTE — Progress Notes (Signed)
 Virtual Visit Consent   Shannon Bentley, you are scheduled for a virtual visit with a [facility] provider today. Just as with appointments in the office, your consent must be obtained to participate. Your consent will be active for this visit and any virtual visit you may have with one of our providers in the next 365 days. If you have a MyChart account, a copy of this consent can be sent to you electronically.  As this is a virtual visit, video technology does not allow for your provider to perform a traditional examination. This may limit your provider's ability to fully assess your condition. If your provider identifies any concerns that need to be evaluated in person or the need to arrange testing (such as labs, EKG, etc.), we will make arrangements to do so. Although advances in technology are sophisticated, we cannot ensure that it will always work on either your end or our end. If the connection with a video visit is poor, the visit may have to be switched to a telephone visit. With either a video or telephone visit, we are not always able to ensure that we have a secure connection.  By engaging in this virtual visit, you consent to the provision of healthcare and authorize for your insurance to be billed (if applicable) for the services provided during this visit. Depending on your insurance coverage, you may receive a charge related to this service.  I need to obtain your verbal consent now. Are you willing to proceed with your visit today? Shannon Bentley has provided verbal consent on 06/20/2023 for a virtual visit (video or telephone). Lanetta Pion, NP  Date: 06/20/2023 10:08 AM   Virtual Visit via Video Note   I, Lanetta Pion, connected with  Shannon Bentley  ([id number], 1990-03-31) on 06/20/23 at 10:15 AM EDT by a video-enabled telemedicine application and verified that I am speaking with the correct person using two identifiers.  Location: Patient: Virtual Visit Location Patient:  Home Provider: Virtual Visit Location Provider: Home Office   I discussed the limitations of evaluation and management by telemedicine and the availability of in person appointments. The patient expressed understanding and agreed to proceed.    History of Present Illness: Shannon Bentley is a 33 y.o. who identifies as a female who was assigned female at birth, and is being seen today for tooth pain  One Sunday- 3-4 days ago started feeling the tooth pain. She started rinsing mouth with salt water, peroxide and warm water. Monday was mowing the yard and started having sinus pressure and right lower for abscess on back molar. And a headache. Denies fevers, chills, nausea and vomiting, opening mouth fully, chewing and or swallowing or talking difficulties Using tylenol  or ASA for pain  History of tooth dental abscess that was treated with ABX and follow up with Dentist and it was cleared by then. This is a damaged tooth and has appt July with Dentist - but is going to call for a sooner one.  Problems:  Patient Active Problem List   Diagnosis Date Noted   Crohn's disease of both small and large intestine without complication (HCC) 11/18/2021   Crohn's disease (HCC) 08/18/2021   Eczema 08/18/2021   Carpal tunnel syndrome, bilateral 08/18/2021   Abnormal Pap smear of cervix 05/17/2020   Left ovarian cyst 04/13/2020   Abnormal chromosomal and genetic finding on antenatal screening mother 03/29/2020   History of gestational diabetes 03/23/2020   Vitamin D  deficiency 05/02/2018   Morbid obesity (  HCC) 05/02/2018    Allergies:  Allergies  Allergen Reactions   Amoxicillin-Pot Clavulanate Hives   Bactrim [Sulfamethoxazole-Trimethoprim] Hives   Benadryl  [Diphenhydramine ] Hives   Dilaudid [Hydromorphone Hcl] Hives   Medications:  Current Outpatient Medications:    Ascorbic Acid (VITAMIN C PO), Take by mouth., Disp: , Rfl:    CALCIUM  PO, Take by mouth., Disp: , Rfl:    cetirizine  (ZYRTEC   ALLERGY) 10 MG tablet, Take 1 tablet (10 mg total) by mouth at bedtime., Disp: 30 tablet, Rfl: 2   Cholecalciferol (VITAMIN D3 PO), Take by mouth., Disp: , Rfl:    clobetasol  cream (TEMOVATE ) 0.05 %, Apply 1 Application topically 2 (two) times daily., Disp: 30 g, Rfl: 6   ELDERBERRY PO, Take by mouth., Disp: , Rfl:    loperamide  (IMODIUM  A-D) 2 MG tablet, Take 1 tablet (2 mg total) by mouth 4 (four) times daily as needed for diarrhea or loose stools., Disp: 30 tablet, Rfl: 0   meloxicam (MOBIC) 7.5 MG tablet, Take 7.5 mg by mouth 2 (two) times daily., Disp: , Rfl:    methocarbamol (ROBAXIN) 500 MG tablet, Take 500 mg by mouth 2 (two) times daily., Disp: , Rfl:    Multiple Vitamin (MULTIVITAMIN) tablet, Take 1 tablet by mouth daily., Disp: , Rfl:    Norethindrone-Ethinyl Estradiol-Fe Biphas (LO LOESTRIN FE ) 1 MG-10 MCG / 10 MCG tablet, TAKE 1 TABLET BY MOUTH EVERY DAY, Disp: 84 tablet, Rfl: 1   ondansetron  (ZOFRAN ) 4 MG tablet, Take 1 tablet (4 mg total) by mouth every 8 (eight) hours as needed for nausea or vomiting., Disp: 30 tablet, Rfl: 0  Observations/Objective: Patient is well-developed, well-nourished in no acute distress.  Resting comfortably  at home.  Head is normocephalic, atraumatic.  No labored breathing.  Speech is clear and coherent with logical content.  Patient is alert and oriented at baseline.    Assessment and Plan:  1. Dental infection (Primary)  - clindamycin (CLEOCIN) 300 MG capsule; Take 1 capsule (300 mg total) by mouth 3 (three) times daily for 7 days.  Dispense: 21 capsule; Refill: 0  -salt water gargles -complete medication as directed -follow up with dentist given the broken tooth for repair -if not better or getting worse be seen in person  Reviewed side effects, risks and benefits of medication.    Patient acknowledged agreement and understanding of the plan.   Past Medical, Surgical, Social History, Allergies, and Medications have been  Reviewed.    Follow Up Instructions: I discussed the assessment and treatment plan with the patient. The patient was provided an opportunity to ask questions and all were answered. The patient agreed with the plan and demonstrated an understanding of the instructions.  A copy of instructions were sent to the patient via MyChart unless otherwise noted below.    The patient was advised to call back or seek an in-person evaluation if the symptoms worsen or if the condition fails to improve as anticipated.    Lanetta Pion, NP

## 2023-07-29 ENCOUNTER — Telehealth: Payer: Self-pay | Admitting: Nurse Practitioner

## 2023-07-29 DIAGNOSIS — R399 Unspecified symptoms and signs involving the genitourinary system: Secondary | ICD-10-CM

## 2023-07-29 MED ORDER — NITROFURANTOIN MONOHYD MACRO 100 MG PO CAPS: 100.0000 mg | ORAL_CAPSULE | Freq: Two times a day (BID) | ORAL | 0 refills | Status: DC

## 2023-07-29 NOTE — Patient Instructions (Signed)
 Shannon Bentley, thank you for joining [name], NP for today's virtual visit.  While this provider is not your primary care provider (PCP), if your PCP is located in our provider database this encounter information will be shared with them immediately following your visit.   A [facility] MyChart account gives you access to today's visit and all your visits, tests, and labs performed at [facility] " click here if you don't have a [facility] MyChart account or go to [email or web address]  Consent: (Patient) Shannon Bentley provided verbal consent for this virtual visit at the beginning of the encounter.  Current Medications:  Current Outpatient Medications:    nitrofurantoin , macrocrystal-monohydrate, (MACROBID ) 100 MG capsule, Take 1 capsule (100 mg total) by mouth 2 (two) times daily for 5 days., Disp: 10 capsule, Rfl: 0   Ascorbic Acid (VITAMIN C PO), Take by mouth., Disp: , Rfl:    CALCIUM  PO, Take by mouth., Disp: , Rfl:    cetirizine  (ZYRTEC  ALLERGY) 10 MG tablet, Take 1 tablet (10 mg total) by mouth at bedtime., Disp: 30 tablet, Rfl: 2   Cholecalciferol (VITAMIN D3 PO), Take by mouth., Disp: , Rfl:    clobetasol  cream (TEMOVATE ) 0.05 %, Apply 1 Application topically 2 (two) times daily., Disp: 30 g, Rfl: 6   ELDERBERRY PO, Take by mouth., Disp: , Rfl:    loperamide  (IMODIUM  A-D) 2 MG tablet, Take 1 tablet (2 mg total) by mouth 4 (four) times daily as needed for diarrhea or loose stools., Disp: 30 tablet, Rfl: 0   meloxicam (MOBIC) 7.5 MG tablet, Take 7.5 mg by mouth 2 (two) times daily., Disp: , Rfl:    methocarbamol (ROBAXIN) 500 MG tablet, Take 500 mg by mouth 2 (two) times daily., Disp: , Rfl:    Multiple Vitamin (MULTIVITAMIN) tablet, Take 1 tablet by mouth daily., Disp: , Rfl:    Norethindrone-Ethinyl Estradiol-Fe Biphas (LO LOESTRIN FE ) 1 MG-10 MCG / 10 MCG tablet, TAKE 1 TABLET BY MOUTH EVERY DAY, Disp: 84 tablet, Rfl: 1   ondansetron  (ZOFRAN ) 4 MG tablet,  Take 1 tablet (4 mg total) by mouth every 8 (eight) hours as needed for nausea or vomiting., Disp: 30 tablet, Rfl: 0   Medications ordered in this encounter:  Meds ordered this encounter  Medications   nitrofurantoin , macrocrystal-monohydrate, (MACROBID ) 100 MG capsule    Sig: Take 1 capsule (100 mg total) by mouth 2 (two) times daily for 5 days.    Dispense:  10 capsule    Refill:  0    Supervising Provider:   Corine Dice [[id number]]     *If you need refills on other medications prior to your next appointment, please contact your pharmacy*  Follow-Up: Call back or seek an in-person evaluation if the symptoms worsen or if the condition fails to improve as anticipated.  [facility] [phone]   If you have been instructed to have an in-person evaluation today at a local Urgent Care facility, please use the link below. It will take you to a list of all of our available [facility], including address, phone number and hours of operation. Please do not delay care.  [facility]  If you or a family member do not have a primary care provider, use the link below to schedule a visit and establish care. When you choose a [facility] primary care physician or advanced practice provider, you gain a long-term partner in health. Find a Primary Care Provider  Learn more about [facility]'s in-office and virtual care options: [facility] - Get Care Now

## 2023-07-29 NOTE — Progress Notes (Signed)
 Virtual Visit Consent   Shannon Bentley, you are scheduled for a virtual visit with a [facility] provider today. Just as with appointments in the office, your consent must be obtained to participate. Your consent will be active for this visit and any virtual visit you may have with one of our providers in the next 365 days. If you have a MyChart account, a copy of this consent can be sent to you electronically.  As this is a virtual visit, video technology does not allow for your provider to perform a traditional examination. This may limit your provider's ability to fully assess your condition. If your provider identifies any concerns that need to be evaluated in person or the need to arrange testing (such as labs, EKG, etc.), we will make arrangements to do so. Although advances in technology are sophisticated, we cannot ensure that it will always work on either your end or our end. If the connection with a video visit is poor, the visit may have to be switched to a telephone visit. With either a video or telephone visit, we are not always able to ensure that we have a secure connection.  By engaging in this virtual visit, you consent to the provision of healthcare and authorize for your insurance to be billed (if applicable) for the services provided during this visit. Depending on your insurance coverage, you may receive a charge related to this service.  I need to obtain your verbal consent now. Are you willing to proceed with your visit today? Shannon Bentley has provided verbal consent on 07/29/2023 for a virtual visit (video or telephone). Shannon Dean, NP  Date: 07/29/2023 4:42 PM   Virtual Visit via Video Note   I, Shannon Bentley, connected with  Shannon Bentley  ([id number], 1990-05-26) on 07/29/23 at  4:30 PM EDT by a video-enabled telemedicine application and verified that I am speaking with the correct person using two identifiers.  Location: Patient: Virtual Visit Location Patient:  Home Provider: Virtual Visit Location Provider: Home Office   I discussed the limitations of evaluation and management by telemedicine and the availability of in person appointments. The patient expressed understanding and agreed to proceed.    History of Present Illness: Shannon Bentley is a 33 y.o. who identifies as a female who was assigned female at birth, and is being seen today for UTI symptoms.  Over the past few days [name] has been experiencing symptoms of lower back pain, dysuria and change in vaginal discharge. She started taking an over the counter medication for UTI and states the symptoms lessened but are still present.  She does not endorse pelvic pressure, hematuria, abdominal pain or N/V   Problems:  Patient Active Problem List   Diagnosis Date Noted   Crohn's disease of both small and large intestine without complication (HCC) 11/18/2021   Crohn's disease (HCC) 08/18/2021   Eczema 08/18/2021   Carpal tunnel syndrome, bilateral 08/18/2021   Abnormal Pap smear of cervix 05/17/2020   Left ovarian cyst 04/13/2020   Abnormal chromosomal and genetic finding on antenatal screening mother 03/29/2020   History of gestational diabetes 03/23/2020   Vitamin D  deficiency 05/02/2018   Morbid obesity (HCC) 05/02/2018    Allergies:  Allergies  Allergen Reactions   Amoxicillin-Pot Clavulanate Hives   Bactrim [Sulfamethoxazole-Trimethoprim] Hives   Benadryl  [Diphenhydramine ] Hives   Dilaudid [Hydromorphone Hcl] Hives   Medications:  Current Outpatient Medications:    nitrofurantoin , macrocrystal-monohydrate, (MACROBID ) 100 MG capsule, Take 1 capsule (100 mg  total) by mouth 2 (two) times daily for 5 days., Disp: 10 capsule, Rfl: 0   Ascorbic Acid (VITAMIN C PO), Take by mouth., Disp: , Rfl:    CALCIUM  PO, Take by mouth., Disp: , Rfl:    cetirizine  (ZYRTEC  ALLERGY) 10 MG tablet, Take 1 tablet (10 mg total) by mouth at bedtime., Disp: 30 tablet, Rfl: 2   Cholecalciferol  (VITAMIN D3 PO), Take by mouth., Disp: , Rfl:    clobetasol  cream (TEMOVATE ) 0.05 %, Apply 1 Application topically 2 (two) times daily., Disp: 30 g, Rfl: 6   ELDERBERRY PO, Take by mouth., Disp: , Rfl:    loperamide  (IMODIUM  A-D) 2 MG tablet, Take 1 tablet (2 mg total) by mouth 4 (four) times daily as needed for diarrhea or loose stools., Disp: 30 tablet, Rfl: 0   meloxicam (MOBIC) 7.5 MG tablet, Take 7.5 mg by mouth 2 (two) times daily., Disp: , Rfl:    methocarbamol (ROBAXIN) 500 MG tablet, Take 500 mg by mouth 2 (two) times daily., Disp: , Rfl:    Multiple Vitamin (MULTIVITAMIN) tablet, Take 1 tablet by mouth daily., Disp: , Rfl:    Norethindrone-Ethinyl Estradiol-Fe Biphas (LO LOESTRIN FE ) 1 MG-10 MCG / 10 MCG tablet, TAKE 1 TABLET BY MOUTH EVERY DAY, Disp: 84 tablet, Rfl: 1   ondansetron  (ZOFRAN ) 4 MG tablet, Take 1 tablet (4 mg total) by mouth every 8 (eight) hours as needed for nausea or vomiting., Disp: 30 tablet, Rfl: 0  Observations/Objective: Patient is well-developed, well-nourished in no acute distress.  Resting comfortably at home.  Head is normocephalic, atraumatic.  No labored breathing.  Speech is clear and coherent with logical content.  Patient is alert and oriented at baseline.    Assessment and Plan: 1. UTI symptoms (Primary) - nitrofurantoin , macrocrystal-monohydrate, (MACROBID ) 100 MG capsule; Take 1 capsule (100 mg total) by mouth 2 (two) times daily for 5 days.  Dispense: 10 capsule; Refill: 0    Follow Up Instructions: I discussed the assessment and treatment plan with the patient. The patient was provided an opportunity to ask questions and all were answered. The patient agreed with the plan and demonstrated an understanding of the instructions.  A copy of instructions were sent to the patient via MyChart unless otherwise noted below.    The patient was advised to call back or seek an in-person evaluation if the symptoms worsen or if the condition fails to  improve as anticipated.    Alvin Diffee W Tannis Burstein, NP

## 2023-07-30 ENCOUNTER — Ambulatory Visit (INDEPENDENT_AMBULATORY_CARE_PROVIDER_SITE_OTHER): Admitting: Family

## 2023-07-30 ENCOUNTER — Ambulatory Visit: Payer: Self-pay | Admitting: Family

## 2023-07-30 ENCOUNTER — Encounter: Payer: Self-pay | Admitting: Family

## 2023-07-30 ENCOUNTER — Ambulatory Visit: Payer: Self-pay

## 2023-07-30 VITALS — BP 103/66 | HR 100 | Temp 97.7°F | Ht 61.0 in | Wt 276.0 lb

## 2023-07-30 DIAGNOSIS — A599 Trichomoniasis, unspecified: Secondary | ICD-10-CM

## 2023-07-30 DIAGNOSIS — N898 Other specified noninflammatory disorders of vagina: Secondary | ICD-10-CM | POA: Diagnosis not present

## 2023-07-30 LAB — URINALYSIS, COMPLETE
Bilirubin, UA: NEGATIVE
Glucose, UA: NEGATIVE
Ketones, UA: NEGATIVE
Nitrite, UA: NEGATIVE
Protein,UA: NEGATIVE
Specific Gravity, UA: 1.015 (ref 1.005–1.030)
Urobilinogen, Ur: 0.2 mg/dL (ref 0.2–1.0)
pH, UA: 7 (ref 5.0–7.5)

## 2023-07-30 LAB — MICROSCOPIC EXAMINATION

## 2023-07-30 LAB — WET PREP FOR TRICH, YEAST, CLUE
Trichomonas Exam: POSITIVE — AB
Yeast Exam: NEGATIVE

## 2023-07-30 MED ORDER — METRONIDAZOLE 500 MG PO TABS
500.0000 mg | ORAL_TABLET | Freq: Two times a day (BID) | ORAL | 0 refills | Status: AC
Start: 2023-07-30 — End: 2023-08-06

## 2023-07-30 NOTE — Telephone Encounter (Signed)
 FYI Only or Action Required?: Action required by provider  Patient was last seen in primary care on 07/29/2023 by [name], NP. Called Nurse Triage reporting Vaginitis. Symptoms began several days ago. Interventions attempted: Rest, hydration, or home remedies. Symptoms are: gradually worsening.  Triage Disposition: See Physician Within 24 Hours  Patient/caregiver understands and will follow disposition?: YesCopied from CRM #[id number]. Topic: Clinical - Red Word Triage >> Jul 30, 2023  7:51 AM Shannon Bentley wrote: [facility] that prompted transfer to Nurse Triage: severe discharge, urine burns, and itches from front to back, very uncomfortable. Reason for Disposition  MODERATE-SEVERE itching (i.e., interferes with school, work, or sleep)  Answer Assessment - Initial Assessment Questions 1. SYMPTOM: "What's the main symptom you're concerned about?" (e.Bentley., pain, itching, dryness)     Itching  2. LOCATION: "Where is the  itching located?" (e.Bentley., inside/outside, left/right)     Whole pubic area 3. ONSET: "When did the    start?"     Thursday  4. PAIN: "Is there any pain?" If Yes, ask: "How bad is it?" (Scale: 1-10; mild, moderate, severe)   -  MILD (1-3): Doesn't interfere with normal activities.    -  MODERATE (4-7): Interferes with normal activities (e.Bentley., work or school) or awakens from sleep.     -  SEVERE (8-10): Excruciating pain, unable to do any normal activities.     Denies pain  5. ITCHING: "Is there any itching?" If Yes, ask: "How bad is it?" (Scale: 1-10; mild, moderate, severe)     moderate 6. CAUSE: "What do you think is causing the discharge?" "Have you had the same problem before? What happened then?"     Yeast infection  7. OTHER SYMPTOMS: "Do you have any other symptoms?" (e.Bentley., fever, itching, vaginal bleeding, pain with urination, injury to genital area, vaginal foreign body)     Burns when urinating, clear discharge with smell  Protocols used: Vaginal Symptoms-A-AH

## 2023-07-30 NOTE — Patient Instructions (Signed)
 Trichomoniasis Trichomoniasis is a sexually transmitted infection (STI). Many people with trichomoniasis do not have any symptoms (are asymptomatic) or have only minimal symptoms. Untreated trichomoniasis can last from months to years. This condition is treated with medicine. What are the causes? This condition is caused by a parasite called Trichomonas vaginalis and is transmitted during sexual contact. What increases the risk? The following factors may make you more likely to develop this condition: Having unprotected sex. Having sex with a partner who has trichomoniasis. Having multiple sexual partners. Having had previous trichomoniasis infections or other STIs. What are the signs or symptoms? In females, symptoms of trichomoniasis include: Itching, burning, redness, or soreness in the genital area. Discomfort while urinating. Abnormal vaginal discharge that is clear, white, gray, or yellow-green and foamy and has an unusual fishy odor. In males, symptoms of trichomoniasis include: Discharge from the penis. Burning after urination or ejaculation. Itching or discomfort inside the penis. How is this diagnosed? This condition is diagnosed based on tests. To perform a test, your health care provider will do one of the following: Ask you to provide a urine sample. Take a sample of discharge. The sample may be taken from the vagina or cervix in females and from the urethra in males. Your health care provider may use a swab to collect the sample. Your health care provider may test you for other STIs, including human immunodeficiency virus (HIV). How is this treated?  This condition is treated with medicines such as metronidazole or tinidazole. These are called antimicrobial medicines, and they are taken by mouth (orally). Your sexual partner or partners also need to be tested and treated. If they have the infection and are not treated, you will likely get reinfected. If you plan to become  pregnant or think you may be pregnant, tell your health care provider right away. Some medicines that are used to treat the infection should not be taken during pregnancy. Your health care provider may recommend over-the-counter medicines or creams to help relieve itching or irritation. You may be tested for the infection again 3 months after treatment. Follow these instructions at home: Medicines Take over-the-counter and prescription medicines only as told by your health care provider. Take your antimicrobial medicine as told by your health care provider. Do not stop taking it even if you start to feel better. Use creams as told by your health care provider. General instructions Do not have sex until after you finish your medicine and your symptoms have resolved. Do not wear tampons while you have the infection (if you are female). Talk with your sexual partner or partners about any symptoms that either of you may have, as well as any history of STIs. Keep all follow-up visits. This is important. How is this prevented?  Use condoms every time you have sex. Using condoms correctly and consistently can help protect against STIs. Do not have sexual contact if you have symptoms of trichomoniasis or another STI. Avoid having multiple sexual partners. Get tested for STIs before you have sex with a partner. Ask all partners to do the same. Do not douche (if you are female). Douching may increase your risk for getting STIs due to the removal of good bacteria in the vagina. Contact a health care provider if: You still have symptoms after you finish your medicine. You develop a rash. You plan to become pregnant or think you may be pregnant. Summary Trichomoniasis is a sexually transmitted infection (STI). This condition often has no symptoms or minimal  symptoms. Take your antimicrobial medicine as told by your health care provider. Do not stop taking even if you start to feel better. Discuss your  infection with your sexual partner or partners. Make sure that all partners get tested and treated, if necessary. You should not have sex until after you finish your medicine and your symptoms have resolved. Keep all follow-up visits. This is important. This information is not intended to replace advice given to you by your health care provider. Make sure you discuss any questions you have with your health care provider. Document Revised: 01/05/2021 Document Reviewed: 01/05/2021 Elsevier Patient Education  2025 [employer].

## 2023-07-30 NOTE — Progress Notes (Signed)
 Subjective:    Patient ID: Shannon Bentley, female    DOB: [birth year], 33 y.o.   MRN: [id number]  Chief Complaint  Patient presents with   Vaginitis   Pt presents to the office today with vaginal itching that started 4 days ago.  Vaginal Itching The patient's primary symptoms include genital itching and vaginal discharge. The patient's pertinent negatives include no genital lesions, genital odor or vaginal bleeding. This is a new problem. The current episode started in the past 7 days. The patient is experiencing no pain. Associated symptoms include frequency and urgency. Pertinent negatives include no chills, constipation, fever, nausea or painful intercourse. The vaginal discharge was clear.      Review of Systems  Constitutional:  Negative for chills and fever.  Gastrointestinal:  Negative for constipation and nausea.  Genitourinary:  Positive for frequency, urgency and vaginal discharge.  All other systems reviewed and are negative.   Social History   Socioeconomic History   Marital status: Single    Spouse name: Not on file   Number of children: Not on file   Years of education: Not on file   Highest education level: Not on file  Occupational History   Not on file  Tobacco Use   Smoking status: Former    Current packs/day: 0.00    Average packs/day: 0.3 packs/day for 1 year (0.3 ttl pk-yrs)    Types: E-cigarettes, Cigarettes    Start date: 06/28/2014    Quit date: 06/28/2015    Years since quitting: 8.0   Smokeless tobacco: Never   Tobacco comments:    vaped in past  Vaping Use   Vaping status: Former   Substances: Flavoring  Substance and Sexual Activity   Alcohol use: Not Currently    Comment: rare wine   Drug use: No   Sexual activity: Not Currently  Other Topics Concern   Not on file  Social History Narrative   WORKS AT [employer]. WANTS TO GO TO SCHOOL FOR NURSING NEXT SPRING.   Social Drivers of Corporate investment banker Strain: Low Risk  (10/19/2020)    Overall Financial Resource Strain (CARDIA)    Difficulty of Paying Living Expenses: Not hard at all  Food Insecurity: No Food Insecurity (10/19/2020)   Hunger Vital Sign    Worried About Running Out of Food in the Last Year: Never true    Ran Out of Food in the Last Year: Never true  Transportation Needs: No Transportation Needs (10/19/2020)   PRAPARE - Administrator, Civil Service (Medical): No    Lack of Transportation (Non-Medical): No  Physical Activity: Insufficiently Active (10/19/2020)   Exercise Vital Sign    Days of Exercise per Week: 3 days    Minutes of Exercise per Session: 30 min  Stress: No Stress Concern Present (10/19/2020)   [employer] of Occupational Health - Occupational Stress Questionnaire    Feeling of Stress : Not at all  Social Connections: Moderately Integrated (10/19/2020)   Social Connection and Isolation Panel [NHANES]    Frequency of Communication with Friends and Family: More than three times a week    Frequency of Social Gatherings with Friends and Family: Three times a week    Attends Religious Services: 1 to 4 times per year    Active Member of Clubs or Organizations: Yes    Attends Banker Meetings: 1 to 4 times per year    Marital Status: Never married   Family History  Problem  Relation Age of Onset   Heart attack Maternal Grandmother    Diabetes Father    Hypertension Father    Other Father        Covid   Diabetes Mother    Hypertension Mother    Fibromyalgia Sister    Migraines Sister    Other Daughter        seasonal allergies   Eczema Daughter    Colon cancer Neg Hx    Colon polyps Neg Hx    Inflammatory bowel disease Neg Hx         Objective:   Physical Exam Vitals reviewed.  Constitutional:      General: She is not in acute distress.    Appearance: She is well-developed. She is obese.  HENT:     Head: Normocephalic and atraumatic.  Eyes:     Pupils: Pupils are equal, round, and reactive to  light.  Neck:     Thyroid : No thyromegaly.  Cardiovascular:     Rate and Rhythm: Normal rate and regular rhythm.     Heart sounds: Normal heart sounds. No murmur heard. Pulmonary:     Effort: Pulmonary effort is normal. No respiratory distress.     Breath sounds: Normal breath sounds. No wheezing.  Abdominal:     General: Bowel sounds are normal. There is no distension.     Palpations: Abdomen is soft.     Tenderness: There is no abdominal tenderness.  Musculoskeletal:        General: No tenderness. Normal range of motion.     Cervical back: Normal range of motion and neck supple.  Skin:    General: Skin is warm and dry.  Neurological:     Mental Status: She is alert and oriented to person, place, and time.     Cranial Nerves: No cranial nerve deficit.     Deep Tendon Reflexes: Reflexes are normal and symmetric.  Psychiatric:        Behavior: Behavior normal.        Thought Content: Thought content normal.        Judgment: Judgment normal.       BP 103/66   Pulse 100   Temp 97.7 F (36.5 C) (Temporal)   Ht 5\' 1"  (1.549 m)   Wt 276 lb (125.2 kg)   SpO2 98%   BMI 52.15 kg/m      Assessment & Plan:  Shannon Bentley comes in today with chief complaint of Vaginitis   Diagnosis and orders addressed:  1. Vagina itching (Primary) - Urinalysis, Complete - WET PREP FOR TRICH, YEAST, CLUE - metroNIDAZOLE (FLAGYL) 500 MG tablet; Take 1 tablet (500 mg total) by mouth 2 (two) times daily for 7 days.  Dispense: 14 tablet; Refill: 0  2. Trichomonas infection Safe sex Partner needs to be treated No sex until completion of flagyl  Needs to be retested in 3 months  Follow up if symptoms worsen or do not improve - metroNIDAZOLE (FLAGYL) 500 MG tablet; Take 1 tablet (500 mg total) by mouth 2 (two) times daily for 7 days.  Dispense: 14 tablet; Refill: 0    Tommas Fragmin, FNP

## 2023-07-30 NOTE — Telephone Encounter (Signed)
 Appointment made today with dod

## 2023-08-20 ENCOUNTER — Other Ambulatory Visit: Payer: Self-pay | Admitting: Family Medicine

## 2023-08-20 DIAGNOSIS — L2082 Flexural eczema: Secondary | ICD-10-CM

## 2023-08-29 ENCOUNTER — Telehealth: Payer: Self-pay | Admitting: Family Medicine

## 2023-08-29 NOTE — Telephone Encounter (Signed)
 Copied from CRM [id number]. Topic: Clinical - Request for Lab/Test Order >> Aug 28, 2023  5:04 PM Winona R wrote:  Pt would like to know if she's had any of these immunizations within the past year and if not can she have order placed to have them done for school. Need done before October. Please contact pt to confirm. Is there a fee for a copy of her immunization records.  Tb- neg within a year skin test Flu Hep B varicella MMR series  TB booster

## 2023-08-29 NOTE — Telephone Encounter (Signed)
 Lmtcb  We do not have up to date shot records  Needs to get records from where she got her childhood shots Has not had a CPE/ NTBS

## 2023-08-30 ENCOUNTER — Telehealth: Payer: Self-pay

## 2023-08-30 NOTE — Telephone Encounter (Signed)
 Copied from CRM #[id number]. Topic: Medical Record Request - Other >> Aug 30, 2023 12:12 PM Zebedee SAUNDERS wrote: Reason for CRM: Pt would like to know if she's had any of these immunizations within the past year and if not can she have order placed to have them done for school. Need done before October. Is there a fee for a copy of her immunization records? Please contact pt [phone] to confirm. Tb- neg within a year skin test Flu Hep B varicella MMR series TB booster

## 2023-08-30 NOTE — Telephone Encounter (Signed)
 Pt aware we don't have full vaccine record but she can schedule a physical and have the titers drawn or call the health department where she received her vaccines to get the record. Pt will call back to schedule CPE if she can't get her vaccine record.

## 2023-09-05 NOTE — Telephone Encounter (Signed)
Refer to phone encounter. 

## 2023-09-07 ENCOUNTER — Other Ambulatory Visit: Payer: Self-pay | Admitting: Adult Health

## 2023-10-08 ENCOUNTER — Telehealth: Payer: Self-pay

## 2023-10-08 NOTE — Telephone Encounter (Signed)
 Patient would like for a nurse to call her; she wants her immunization records.

## 2023-10-08 NOTE — Telephone Encounter (Signed)
 Returned patient's call.  Requesting immunization record for TDAP in 2022. Will print and leave at front desk for pick up.  Pt verbalized understanding with no further questions.

## 2023-10-17 ENCOUNTER — Encounter: Payer: Self-pay | Admitting: Family Medicine

## 2023-10-17 ENCOUNTER — Ambulatory Visit: Payer: Self-pay | Admitting: Family Medicine

## 2023-10-17 ENCOUNTER — Ambulatory Visit: Admitting: Family Medicine

## 2023-10-17 DIAGNOSIS — K5080 Crohn's disease of both small and large intestine without complications: Secondary | ICD-10-CM | POA: Diagnosis not present

## 2023-10-17 DIAGNOSIS — L301 Dyshidrosis [pompholyx]: Secondary | ICD-10-CM | POA: Insufficient documentation

## 2023-10-17 DIAGNOSIS — R41840 Attention and concentration deficit: Secondary | ICD-10-CM | POA: Insufficient documentation

## 2023-10-17 DIAGNOSIS — Z111 Encounter for screening for respiratory tuberculosis: Secondary | ICD-10-CM

## 2023-10-17 DIAGNOSIS — R7303 Prediabetes: Secondary | ICD-10-CM | POA: Diagnosis not present

## 2023-10-17 DIAGNOSIS — E559 Vitamin D deficiency, unspecified: Secondary | ICD-10-CM

## 2023-10-17 LAB — LIPID PANEL

## 2023-10-17 LAB — BAYER DCA HB A1C WAIVED: HB A1C (BAYER DCA - WAIVED): 6.2 % — ABNORMAL HIGH (ref 4.8–5.6)

## 2023-10-17 MED ORDER — CLOBETASOL PROPIONATE 0.05 % EX CREA: TOPICAL_CREAM | Freq: Two times a day (BID) | CUTANEOUS | 1 refills | Status: AC

## 2023-10-17 MED ORDER — PREDNISONE 20 MG PO TABS: 40.0000 mg | ORAL_TABLET | Freq: Every day | ORAL | 0 refills | Status: AC

## 2023-10-17 NOTE — Progress Notes (Signed)
 Subjective:  Patient ID: Shannon Bentley, female    DOB: November 23, 1990, 33 y.o.   MRN: [id number]  Patient Care Team: Severa Rock HERO, FNP as PCP - General (Family Medicine) Harvey Margo CROME, MD (Inactive) as Consulting Physician (Gastroenterology)   Chief Complaint:  Medical Management of Chronic Issues (School form )   HPI: Shannon Bentley is a 33 y.o. female presenting on 10/17/2023 for Medical Management of Chronic Issues (School form )   Discussed the use of AI scribe software for clinical note transcription with the patient, who gave verbal consent to proceed.  History of Present Illness   Shannon Bentley is a 33 year old female who presents for vaccination updates and a TB skin test.  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 Her MMR titers were low during a previous pregnancy. She is also concerned about ensuring her hepatitis B vaccination is up to date. She plans to obtain her vaccination records from [facility] to verify her immunization status.  She has a history of ADHD diagnosed in childhood, managed without medication. Her condition is primarily attention deficit without hyperactivity. She has never been on medication for ADHD and was managed with accommodations such as extra time for tasks. She also mentions being dyslexic.  She has a history of Crohn's disease and is not currently taking medication for it. She can identify when she is experiencing a flare-up and manages it by resting. She has not seen a gastroenterologist recently but feels her condition is stable.  She reports a history of prediabetes with an A1c of 6.2, indicating a slight increase from a previous measurement of 5.9. She monitors her diet and engages in physical activity, such as walking, to manage her blood sugar levels. She notes increased thirst but no increased hunger, urination, confusion, or weakness.  She describes a recurring  issue with her feet, characterized by painful, itchy water blisters, which she has self-treated as athlete's foot. The condition resembles eczema, which she also has on her hands. She has used clobetasol  in the past, which provided some relief.          Relevant past medical, surgical, family, and social history reviewed and updated as indicated.  Allergies and medications reviewed and updated. Data reviewed: Chart in Epic.   Past Medical History:  Diagnosis Date   Anemia    Crohn's disease (HCC)    GERD (gastroesophageal reflux disease)    Sleep apnea     Past Surgical History:  Procedure Laterality Date   APPENDECTOMY     BIOPSY  07/03/2017   Procedure: BIOPSY;  Surgeon: Harvey Margo CROME, MD;  [facility];  Service: Endoscopy;;  ileum random colon   COLONOSCOPY  07/2015   Dr. Donnel: evidence of prior surgical anastomosis, multiple biopsies. 2 small ulcers at anastomosis. TI and remaining colon normal, without evidence of IBD. Path with acute colitis with reactive changes, features of Crohn's not specifically identified. non-specific. differentials including infection, drug effects, self-limited colitis   COLONOSCOPY WITH PROPOFOL  N/A 07/03/2017   Procedure: COLONOSCOPY WITH PROPOFOL ;  Surgeon: Harvey Margo CROME, MD;  Location: [facility];  Service: Endoscopy;  Laterality: N/A;  8:30am   EXPLORATORY LAPAROTOMY  05/2015   Dr. 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 path with crohn's disease involving both ileum and cecum   TONSILLECTOMY     tonsils and  adenoids      Social History   Socioeconomic History   Marital status: Single    Spouse name: Not on file   Number of children: Not on file   Years of education: Not on file   Highest education level: Not on file  Occupational History   Not on file  Tobacco Use   Smoking status: Former    Current packs/day:  0.00    Average packs/day: 0.3 packs/day for 1 year (0.3 ttl pk-yrs)    Types: E-cigarettes, Cigarettes    Start date: 06/28/2014    Quit date: 06/28/2015    Years since quitting: 8.3   Smokeless tobacco: Never   Tobacco comments:    vaped in past  Vaping Use   Vaping status: Former   Substances: Flavoring  Substance and Sexual Activity   Alcohol use: Not Currently    Comment: rare wine   Drug use: No   Sexual activity: Not Currently  Other Topics Concern   Not on file  Social History Narrative   WORKS AT [employer]. WANTS TO GO TO SCHOOL FOR NURSING NEXT SPRING.   Social Drivers of Corporate investment banker Strain: Low Risk  (10/19/2020)   Overall Financial Resource Strain (CARDIA)    Difficulty of Paying Living Expenses: Not hard at all  Food Insecurity: No Food Insecurity (10/19/2020)   Hunger Vital Sign    Worried About Running Out of Food in the Last Year: Never true    Ran Out of Food in the Last Year: Never true  Transportation Needs: No Transportation Needs (10/19/2020)   PRAPARE - Administrator, Civil Service (Medical): No    Lack of Transportation (Non-Medical): No  Physical Activity: Insufficiently Active (10/19/2020)   Exercise Vital Sign    Days of Exercise per Week: 3 days    Minutes of Exercise per Session: 30 min  Stress: No Stress Concern Present (10/19/2020)   [employer] of Occupational Health - Occupational Stress Questionnaire    Feeling of Stress : Not at all  Social Connections: Moderately Integrated (10/19/2020)   Social Connection and Isolation Panel    Frequency of Communication with Friends and Family: More than three times a week    Frequency of Social Gatherings with Friends and Family: Three times a week    Attends Religious Services: 1 to 4 times per year    Active Member of Clubs or Organizations: Yes    Attends Banker Meetings: 1 to 4 times per year    Marital Status: Never married  Intimate Partner Violence: Not  At Risk (10/19/2020)   Humiliation, Afraid, Rape, and Kick questionnaire    Fear of Current or Ex-Partner: No    Emotionally Abused: No    Physically Abused: No    Sexually Abused: No    Outpatient Encounter Medications as of 10/17/2023  Medication Sig   Ascorbic Acid (VITAMIN C PO) Take by mouth.   CALCIUM  PO Take by mouth.   cetirizine  (ZYRTEC  ALLERGY) 10 MG tablet Take 1 tablet (10 mg total) by mouth at bedtime.   Cholecalciferol (VITAMIN D3 PO) Take by mouth.   ELDERBERRY PO Take by mouth.   loperamide  (IMODIUM  A-D) 2 MG tablet Take 1 tablet (2 mg total) by mouth 4 (four) times daily as needed for diarrhea or loose stools.   Multiple Vitamin (MULTIVITAMIN) tablet Take 1 tablet by mouth daily.   Norethindrone-Ethinyl Estradiol-Fe Biphas (LO LOESTRIN FE ) 1 MG-10 MCG / 10 MCG  tablet TAKE 1 TABLET BY MOUTH EVERY DAY   predniSONE  (DELTASONE ) 20 MG tablet Take 2 tablets (40 mg total) by mouth daily with breakfast for 5 days.   [DISCONTINUED] clobetasol  cream (TEMOVATE ) 0.05 % APPLY TOPICALLY TO THE AFFECTED AREA TWICE DAILY   clobetasol  cream (TEMOVATE ) 0.05 % Apply topically 2 (two) times daily.   [DISCONTINUED] meloxicam (MOBIC) 7.5 MG tablet Take 7.5 mg by mouth 2 (two) times daily. (Patient not taking: Reported on 07/30/2023)   [DISCONTINUED] methocarbamol (ROBAXIN) 500 MG tablet Take 500 mg by mouth 2 (two) times daily.   No facility-administered encounter medications on file as of 10/17/2023.    Allergies  Allergen Reactions   Amoxicillin-Pot Clavulanate Hives   Bactrim [Sulfamethoxazole-Trimethoprim] Hives   Benadryl  [Diphenhydramine ] Hives   Dilaudid [Hydromorphone Hcl] Hives    Pertinent ROS per HPI, otherwise unremarkable      Objective:  BP 104/62   Pulse 93   Temp 97.6 F (36.4 C)   Ht 5' 1 (1.549 m)   Wt 275 lb (124.7 kg)   LMP 10/03/2023   SpO2 99%   BMI 51.96 kg/m    Wt Readings from Last 3 Encounters:  10/17/23 275 lb (124.7 kg)  07/30/23 276 lb (125.2  kg)  02/24/22 226 lb (102.5 kg)    Physical Exam Vitals and nursing note reviewed.  Constitutional:      Appearance: Normal appearance. She is well-developed and well-groomed. She is morbidly obese.  HENT:     Head: Normocephalic and atraumatic.     Mouth/Throat:     Mouth: Mucous membranes are moist.  Eyes:     Conjunctiva/sclera: Conjunctivae normal.     Pupils: Pupils are equal, round, and reactive to light.  Cardiovascular:     Rate and Rhythm: Normal rate and regular rhythm.     Heart sounds: Normal heart sounds.  Pulmonary:     Effort: Pulmonary effort is normal.     Breath sounds: Normal breath sounds.  Musculoskeletal:     Right lower leg: No edema.     Left lower leg: No edema.  Feet:     Right foot:     Skin integrity: Skin breakdown, erythema and dry skin present.     Left foot:     Skin integrity: Skin breakdown, erythema and dry skin present.     Comments: Multiple number of vesicles and mild erythema with desquamation to bilateral feet Skin:    General: Skin is warm and dry.     Capillary Refill: Capillary refill takes less than 2 seconds.     Findings: Rash present.  Neurological:     General: No focal deficit present.     Mental Status: She is alert and oriented to person, place, and time.  Psychiatric:        Mood and Affect: Mood normal.        Behavior: Behavior normal. Behavior is cooperative.        Thought Content: Thought content normal.        Judgment: Judgment normal.      Results for orders placed or performed in visit on 07/30/23  WET PREP FOR TRICH, YEAST, CLUE   Collection Time: 07/30/23  3:06 PM   Specimen: Vaginal Swab   Vaginal Swab  Result Value Ref Range   Trichomonas Exam Positive (A) Negative   Yeast Exam Negative Negative   Clue Cell Exam Comment: Negative  Microscopic Examination   Collection Time: 07/30/23  3:06 PM   Urine  Result Value Ref Range   WBC, UA 11-30 (A) 0 - 5 /hpf   RBC, Urine 0-2 0 - 2 /hpf   Epithelial  Cells (non renal) 0-10 0 - 10 /hpf   Bacteria, UA Few None seen/Few   Trichomonas, UA Present (A) None seen  Urinalysis, Complete   Collection Time: 07/30/23  3:06 PM  Result Value Ref Range   Specific Gravity, UA 1.015 1.005 - 1.030   pH, UA 7.0 5.0 - 7.5   Color, UA Yellow Yellow   Appearance Ur Hazy (A) Clear   Leukocytes,UA 2+ (A) Negative   Protein,UA Negative Negative/Trace   Glucose, UA Negative Negative   Ketones, UA Negative Negative   RBC, UA Trace (A) Negative   Bilirubin, UA Negative Negative   Urobilinogen, Ur 0.2 0.2 - 1.0 mg/dL   Nitrite, UA Negative Negative   Microscopic Examination See below:        Pertinent labs & imaging results that were available during my care of the patient were reviewed by me and considered in my medical decision making.  Assessment & Plan:  Brooklynne Pereida was seen today for medical management of chronic issues.  Diagnoses and all orders for this visit:  Vitamin D  deficiency -     CMP14+EGFR -     Vitamin D , 25-hydroxy  Pre-diabetes -     Bayer DCA Hb A1c Waived -     CBC with Differential/Platelet -     CMP14+EGFR -     Lipid panel  Morbid obesity (HCC) -     Bayer DCA Hb A1c Waived -     CBC with Differential/Platelet -     CMP14+EGFR -     Lipid panel -     Vitamin D , 25-hydroxy  Crohn's disease of both small and large intestine without complication (HCC) -     CBC with Differential/Platelet -     CMP14+EGFR  Attention or concentration deficit School form completed.   Dyshidrotic eczema -     predniSONE  (DELTASONE ) 20 MG tablet; Take 2 tablets (40 mg total) by mouth daily with breakfast for 5 days. -     clobetasol  cream (TEMOVATE ) 0.05 %; Apply topically 2 (two) times daily.  Screening-pulmonary TB -     PPD      Dyshidrotic eczema of feet and hands Recurrent dyshidrotic eczema on feet, characterized by painful and pruritic vesicles. Previous treatment with clobetasol  provided partial relief. Current episode  has persisted for about a month, longer than previous episodes. - Prescribe oral prednisone  - Refill clobetasol  for topical treatment - Advise on soaking feet in warm water or Epsom salt to alleviate symptoms  Pre-diabetes A1c is 6.2, indicating pre-diabetes. A1c has increased from 5.9 in September of last year. She reports monitoring diet and exercise but acknowledges not always adhering to a healthy lifestyle. - Emphasize importance of diet and exercise - Schedule follow-up in 3 months to reassess A1c - Discuss potential initiation of medication if A1c increases  Obesity Weight has increased by 50 pounds since last visit in January of last year. She acknowledges the need to lose weight and is aware of the importance of diet and exercise. - Encourage adherence to a healthy diet and regular exercise  Immunization status evaluation and update Immunization records are not up to date in the [place]. MMR titers were low during pregnancy, indicating a potential need for a booster. Hepatitis B vaccination status needs to be confirmed and updated if necessary. She is attending  school and requires updated immunization records by October 15th. - Contact [facility] to obtain immunization records - Compare obtained records with current records to determine needed vaccinations - Administer MMR booster if titers remain low - Ensure hepatitis B vaccination is up to date - Schedule vaccinations based on updated records  Attention deficit disorder, inattentive type Diagnosed with ADD as a child, managed without medication. She requires documentation for school accommodations. She also has dyslexia. - Complete necessary forms for school accommodations  Crohn's disease, well controlled Crohn's disease is currently well controlled without medication. She is able to recognize flare-ups and manage them by resting. She has not seen a GI specialist in a while but reports no current issues. - Advise  to return to GI specialist if symptoms worsen or flare-ups become unmanageable      I spent 45 minutes dedicated to the care of this patient on the date of this encounter to include pre-visit review of records including diagnostic studies and labs, face-to-face time with the patient discussing above conditions, post visit ordering of testing, clinical documentation in the electronic health record, making appropriate referrals as documented, and communicating necessary information to the patient's healthcare team.     Continue all other maintenance medications.  Follow up plan: Return in about 3 months (around 01/17/2024) for prediabetes .   Continue healthy lifestyle choices, including diet (rich in fruits, vegetables, and lean proteins, and low in salt and simple carbohydrates) and exercise (at least 30 minutes of moderate physical activity daily).  Educational handout given for prediabetes, DM  The above assessment and management plan was discussed with the patient. The patient verbalized understanding of and has agreed to the management plan. Patient is aware to call the clinic if they develop any new symptoms or if symptoms persist or worsen. Patient is aware when to return to the clinic for a follow-up visit. Patient educated on when it is appropriate to go to the emergency department.   Rosaline Bruns, FNP-C [facility] [phone]

## 2023-10-17 NOTE — Patient Instructions (Signed)

## 2023-10-18 ENCOUNTER — Ambulatory Visit (INDEPENDENT_AMBULATORY_CARE_PROVIDER_SITE_OTHER)

## 2023-10-18 DIAGNOSIS — Z23 Encounter for immunization: Secondary | ICD-10-CM | POA: Diagnosis not present

## 2023-10-18 DIAGNOSIS — Z0184 Encounter for antibody response examination: Secondary | ICD-10-CM

## 2023-10-18 LAB — CBC WITH DIFFERENTIAL/PLATELET
Basophils Absolute: 0 x10E3/uL (ref 0.0–0.2)
Basos: 0 %
EOS (ABSOLUTE): 0.1 x10E3/uL (ref 0.0–0.4)
Eos: 1 %
Hematocrit: 40.9 % (ref 34.0–46.6)
Hemoglobin: 12.8 g/dL (ref 11.1–15.9)
Immature Grans (Abs): 0 x10E3/uL (ref 0.0–0.1)
Immature Granulocytes: 0 %
Lymphocytes Absolute: 2.6 x10E3/uL (ref 0.7–3.1)
Lymphs: 29 %
MCH: 26.4 pg — ABNORMAL LOW (ref 26.6–33.0)
MCHC: 31.3 g/dL — ABNORMAL LOW (ref 31.5–35.7)
MCV: 85 fL (ref 79–97)
Monocytes Absolute: 0.5 x10E3/uL (ref 0.1–0.9)
Monocytes: 6 %
Neutrophils Absolute: 5.7 x10E3/uL (ref 1.4–7.0)
Neutrophils: 64 %
Platelets: 370 x10E3/uL (ref 150–450)
RBC: 4.84 x10E6/uL (ref 3.77–5.28)
RDW: 13.2 % (ref 11.7–15.4)
WBC: 9 x10E3/uL (ref 3.4–10.8)

## 2023-10-18 LAB — CMP14+EGFR
ALT: 8 IU/L (ref 0–32)
AST: 12 IU/L (ref 0–40)
Albumin: 4.1 g/dL (ref 3.9–4.9)
Alkaline Phosphatase: 82 IU/L (ref 44–121)
BUN/Creatinine Ratio: 14 (ref 9–23)
BUN: 11 mg/dL (ref 6–20)
Bilirubin Total: 0.4 mg/dL (ref 0.0–1.2)
CO2: 23 mmol/L (ref 20–29)
Calcium: 9.4 mg/dL (ref 8.7–10.2)
Chloride: 104 mmol/L (ref 96–106)
Creatinine, Ser: 0.8 mg/dL (ref 0.57–1.00)
Globulin, Total: 3 g/dL (ref 1.5–4.5)
Glucose: 107 mg/dL — AB (ref 70–99)
Potassium: 3.9 mmol/L (ref 3.5–5.2)
Sodium: 139 mmol/L (ref 134–144)
Total Protein: 7.1 g/dL (ref 6.0–8.5)
eGFR: 100 mL/min/1.73 (ref >59)

## 2023-10-18 LAB — LIPID PANEL
Cholesterol, Total: 165 mg/dL (ref 100–199)
HDL: 47 mg/dL (ref >39)
LDL CALC COMMENT:: 3.5 ratio (ref 0.0–4.4)
LDL Chol Calc (NIH): 91 mg/dL (ref 0–99)
Triglycerides: 155 mg/dL — AB (ref 0–149)
VLDL Cholesterol Cal: 27 mg/dL (ref 5–40)

## 2023-10-18 LAB — TB SKIN TEST: TB Skin Test: NEGATIVE

## 2023-10-18 LAB — VITAMIN D 25 HYDROXY (VIT D DEFICIENCY, FRACTURES): Vit D, 25-Hydroxy: 23.3 ng/mL — AB (ref 30.0–100.0)

## 2023-10-18 NOTE — Progress Notes (Signed)
 Patient in today for PPD reading. 0 mm negative.  Patient also presented with college paperwork to be filled out with immunization history.  Patient was in need of Meningitis vaccine. Vaccine given in left deltoid and patient tolerated well.  She provided no proof of Varicella vaccines. Titer ordered and drawn.  Will keep school form in Triage until titer result and will call patient with completed form when ready.

## 2023-10-19 ENCOUNTER — Ambulatory Visit: Payer: Self-pay | Admitting: Family Medicine

## 2023-10-19 LAB — VARICELLA ZOSTER ANTIBODY, IGG: Varicella zoster IgG: REACTIVE

## 2023-10-19 NOTE — Telephone Encounter (Signed)
 Patient aware and verbalized understanding.

## 2023-10-25 ENCOUNTER — Ambulatory Visit (INDEPENDENT_AMBULATORY_CARE_PROVIDER_SITE_OTHER)

## 2023-10-25 DIAGNOSIS — Z23 Encounter for immunization: Secondary | ICD-10-CM

## 2023-10-26 ENCOUNTER — Ambulatory Visit

## 2023-10-31 ENCOUNTER — Telehealth: Payer: Self-pay | Admitting: Family Medicine

## 2023-10-31 NOTE — Telephone Encounter (Signed)
 Rx faxed over and patient aware

## 2023-10-31 NOTE — Telephone Encounter (Signed)
 Copied from CRM [id number]. Topic: Clinical - Prescription Issue >> Oct 31, 2023 11:28 AM Gustabo D wrote: Pt wants to get her Covid booster she needs a prescription send to [facility] in [place] [address]

## 2023-11-01 ENCOUNTER — Telehealth: Payer: Self-pay

## 2023-11-01 NOTE — Telephone Encounter (Signed)
 Copied from CRM #[id number]. Topic: Appointments - Appointment Scheduling >> Nov 01, 2023  3:31 PM Montie POUR wrote: Patient/patient representative is calling to schedule an appointment. Refer to attachments for appointment information.  She needs an annual physical for school - She has paperwork to be completed. She needs her physical by the week after October 15. Please call her at [phone] or [phone] to see if she can have physical sooner so she can start school.  Thanks >> Nov 01, 2023  3:50 PM Mitzi M wrote: Patient has appt 03-04-2024 and needs to have a physical for school ASAP. Please call patient with physical appt. >> Nov 01, 2023  3:48 PM Mitzi M wrote: Patient has app 03-04-2024

## 2023-11-02 NOTE — Telephone Encounter (Signed)
 Appointment scheduled.

## 2023-11-10 ENCOUNTER — Telehealth: Payer: Self-pay | Admitting: Nurse Practitioner

## 2023-11-10 DIAGNOSIS — J069 Acute upper respiratory infection, unspecified: Secondary | ICD-10-CM

## 2023-11-10 MED ORDER — PSEUDOEPH-BROMPHEN-DM 30-2-10 MG/5ML PO SYRP: 5.0000 mL | ORAL_SOLUTION | Freq: Four times a day (QID) | ORAL | 0 refills | Status: AC | PRN

## 2023-11-10 MED ORDER — IPRATROPIUM BROMIDE 0.03 % NA SOLN: 2.0000 | Freq: Two times a day (BID) | NASAL | 0 refills | Status: AC

## 2023-11-10 NOTE — Patient Instructions (Signed)
 Shannon Bentley, thank you for joining Haze LELON Servant, NP for today's virtual visit.  While this provider is not your primary care provider (PCP), if your PCP is located in our provider database this encounter information will be shared with them immediately following your visit.   A [facility] MyChart account gives you access to today's visit and all your visits, tests, and labs performed at [facility]  click here if you don't have a [facility] MyChart account or go to [email or web address]  Consent: (Patient) Shannon Bentley provided verbal consent for this virtual visit at the beginning of the encounter.  Current Medications:  Current Outpatient Medications:    brompheniramine-pseudoephedrine-DM 30-2-10 MG/5ML syrup, Take 5 mLs by mouth 4 (four) times daily as needed., Disp: 240 mL, Rfl: 0   ipratropium (ATROVENT ) 0.03 % nasal spray, Place 2 sprays into both nostrils every 12 (twelve) hours., Disp: 30 mL, Rfl: 0   Ascorbic Acid (VITAMIN C PO), Take by mouth., Disp: , Rfl:    CALCIUM  PO, Take by mouth., Disp: , Rfl:    cetirizine  (ZYRTEC  ALLERGY) 10 MG tablet, Take 1 tablet (10 mg total) by mouth at bedtime., Disp: 30 tablet, Rfl: 2   Cholecalciferol (VITAMIN D3 PO), Take by mouth., Disp: , Rfl:    clobetasol  cream (TEMOVATE ) 0.05 %, Apply topically 2 (two) times daily., Disp: 30 g, Rfl: 1   ELDERBERRY PO, Take by mouth., Disp: , Rfl:    loperamide  (IMODIUM  A-D) 2 MG tablet, Take 1 tablet (2 mg total) by mouth 4 (four) times daily as needed for diarrhea or loose stools., Disp: 30 tablet, Rfl: 0   Multiple Vitamin (MULTIVITAMIN) tablet, Take 1 tablet by mouth daily., Disp: , Rfl:    Norethindrone-Ethinyl Estradiol-Fe Biphas (LO LOESTRIN FE ) 1 MG-10 MCG / 10 MCG tablet, TAKE 1 TABLET BY MOUTH EVERY DAY, Disp: 84 tablet, Rfl: 0   Medications ordered in this encounter:  Meds ordered this encounter  Medications   ipratropium (ATROVENT ) 0.03 % nasal spray    Sig: Place 2  sprays into both nostrils every 12 (twelve) hours.    Dispense:  30 mL    Refill:  0    Supervising Provider:   LAMPTEY, PHILIP O [[id number]]   brompheniramine-pseudoephedrine-DM 30-2-10 MG/5ML syrup    Sig: Take 5 mLs by mouth 4 (four) times daily as needed.    Dispense:  240 mL    Refill:  0    Supervising Provider:   BLAISE ALEENE KIDD [[id number]]     *If you need refills on other medications prior to your next appointment, please contact your pharmacy*  Follow-Up: Call back or seek an in-person evaluation if the symptoms worsen or if the condition fails to improve as anticipated.  [facility] [phone]  Other Instructions INSTRUCTIONS: use a humidifier for nasal congestion Drink plenty of fluids, rest and wash hands frequently to avoid the spread of infection Alternate tylenol  and Motrin  for relief of fever    If you have been instructed to have an in-person evaluation today at a local Urgent Care facility, please use the link below. It will take you to a list of all of our available [facility], including address, phone number and hours of operation. Please do not delay care.  [facility]  If you or a family member do not have a primary care provider, use the link below to schedule a visit and establish care. When you choose a [facility]  primary care physician or advanced practice provider, you gain a long-term partner in health. Find a Primary Care Provider  Learn more about [facility]'s in-office and virtual care options: [facility] Get Care Now

## 2023-11-10 NOTE — Progress Notes (Signed)
 Virtual Visit Consent   Shannon Bentley, you are scheduled for a virtual visit with a [facility] provider today. Just as with appointments in the office, your consent must be obtained to participate. Your consent will be active for this visit and any virtual visit you may have with one of our providers in the next 365 days. If you have a MyChart account, a copy of this consent can be sent to you electronically.  As this is a virtual visit, video technology does not allow for your provider to perform a traditional examination. This may limit your provider's ability to fully assess your condition. If your provider identifies any concerns that need to be evaluated in person or the need to arrange testing (such as labs, EKG, etc.), we will make arrangements to do so. Although advances in technology are sophisticated, we cannot ensure that it will always work on either your end or our end. If the connection with a video visit is poor, the visit may have to be switched to a telephone visit. With either a video or telephone visit, we are not always able to ensure that we have a secure connection.  By engaging in this virtual visit, you consent to the provision of healthcare and authorize for your insurance to be billed (if applicable) for the services provided during this visit. Depending on your insurance coverage, you may receive a charge related to this service.  I need to obtain your verbal consent now. Are you willing to proceed with your visit today? Shannon Bentley has provided verbal consent on 11/10/2023 for a virtual visit (video or telephone). Shannon LELON Servant, NP  Date: 11/10/2023 12:23 PM   Virtual Visit via Video Note   I, Shannon Bentley, connected with  Shannon Bentley  ([id number], Nov 07, 1990) on 11/10/23 at 12:15 PM EDT by a video-enabled telemedicine application and verified that I am speaking with the correct person using two identifiers.  Location: Patient: Virtual Visit Location Patient:  Home Provider: Virtual Visit Location Provider: Home Office   I discussed the limitations of evaluation and management by telemedicine and the availability of in person appointments. The patient expressed understanding and agreed to proceed.    History of Present Illness: Shannon Bentley is a 33 y.o. who identifies as a female who was assigned female at birth, and is being seen today for URI with cough.  Over the past 3 days Shannon Bentley has been experiencing nasal congestion, sinus pressure, headache, dry cough, sore throat, hoarseness. In home COVID test negative. Taking zyrtec , Alka-Seltzer cold medication and using an Afrin nasal spray with little relief.    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   Abnormal Pap smear of cervix 05/17/2020   Left ovarian cyst 04/13/2020   Abnormal chromosomal and genetic finding on antenatal screening mother 03/29/2020   History of gestational diabetes 03/23/2020   Pre-diabetes 03/17/2020   Vitamin D  deficiency 05/02/2018   Morbid obesity (HCC) 05/02/2018    Allergies:  Allergies  Allergen Reactions   Amoxicillin-Pot Clavulanate Hives   Bactrim [Sulfamethoxazole-Trimethoprim] Hives   Benadryl  [Diphenhydramine ] Hives   Dilaudid [Hydromorphone Hcl] Hives   Medications:  Current Outpatient Medications:    brompheniramine-pseudoephedrine-DM 30-2-10 MG/5ML syrup, Take 5 mLs  by mouth 4 (four) times daily as needed., Disp: 240 mL, Rfl: 0   ipratropium (ATROVENT ) 0.03 % nasal spray, Place 2 sprays into both nostrils every 12 (twelve) hours., Disp: 30 mL, Rfl: 0   Ascorbic Acid (VITAMIN C PO), Take by mouth., Disp: , Rfl:    CALCIUM  PO, Take by mouth., Disp: ,  Rfl:    cetirizine  (ZYRTEC  ALLERGY) 10 MG tablet, Take 1 tablet (10 mg total) by mouth at bedtime., Disp: 30 tablet, Rfl: 2   Cholecalciferol (VITAMIN D3 PO), Take by mouth., Disp: , Rfl:    clobetasol  cream (TEMOVATE ) 0.05 %, Apply topically 2 (two) times daily., Disp: 30 g, Rfl: 1   ELDERBERRY PO, Take by mouth., Disp: , Rfl:    loperamide  (IMODIUM  A-D) 2 MG tablet, Take 1 tablet (2 mg total) by mouth 4 (four) times daily as needed for diarrhea or loose stools., Disp: 30 tablet, Rfl: 0   Multiple Vitamin (MULTIVITAMIN) tablet, Take 1 tablet by mouth daily., Disp: , Rfl:    Norethindrone-Ethinyl Estradiol-Fe Biphas (LO LOESTRIN FE ) 1 MG-10 MCG / 10 MCG tablet, TAKE 1 TABLET BY MOUTH EVERY DAY, Disp: 84 tablet, Rfl: 0  Observations/Objective: Patient is well-developed, well-nourished in no acute distress.  Resting comfortably at home.  Head is normocephalic, atraumatic.  No labored breathing.  Speech is clear and coherent with logical content.  Patient is alert and oriented at baseline.    Assessment and Plan: 1. URI with cough and congestion (Primary) - ipratropium (ATROVENT ) 0.03 % nasal spray; Place 2 sprays into both nostrils every 12 (twelve) hours.  Dispense: 30 mL; Refill: 0 - brompheniramine-pseudoephedrine-DM 30-2-10 MG/5ML syrup; Take 5 mLs by mouth 4 (four) times daily as needed.  Dispense: 240 mL; Refill: 0  INSTRUCTIONS: use a humidifier for nasal congestion Drink plenty of fluids, rest and wash hands frequently to avoid the spread of infection Alternate tylenol  and Motrin  for relief of fever   Follow Up Instructions: I discussed the assessment and treatment plan with the patient. The patient was provided an opportunity to ask questions and all were answered. The patient agreed with the plan and demonstrated an understanding of the instructions.  A copy of instructions were sent to the patient via MyChart unless otherwise noted below.     The patient was advised to call  back or seek an in-person evaluation if the symptoms worsen or if the condition fails to improve as anticipated.    Jaelen Soth W Briani Maul, NP

## 2023-11-30 ENCOUNTER — Encounter: Payer: Self-pay | Admitting: Family Medicine

## 2023-11-30 ENCOUNTER — Ambulatory Visit: Admitting: Family Medicine

## 2023-11-30 ENCOUNTER — Ambulatory Visit: Payer: Self-pay | Admitting: Family Medicine

## 2023-11-30 VITALS — BP 120/81 | HR 97 | Temp 97.8°F | Resp 18 | Ht 61.0 in | Wt 274.0 lb

## 2023-11-30 DIAGNOSIS — Z136 Encounter for screening for cardiovascular disorders: Secondary | ICD-10-CM

## 2023-11-30 DIAGNOSIS — E559 Vitamin D deficiency, unspecified: Secondary | ICD-10-CM

## 2023-11-30 DIAGNOSIS — Z0000 Encounter for general adult medical examination without abnormal findings: Secondary | ICD-10-CM

## 2023-11-30 DIAGNOSIS — Z0001 Encounter for general adult medical examination with abnormal findings: Secondary | ICD-10-CM | POA: Diagnosis not present

## 2023-11-30 DIAGNOSIS — Z130 Encounter for screening for diseases of the blood and blood-forming organs and certain disorders involving the immune mechanism: Secondary | ICD-10-CM

## 2023-11-30 LAB — BAYER DCA HB A1C WAIVED: HB A1C (BAYER DCA - WAIVED): 6.2 % — ABNORMAL HIGH (ref 4.8–5.6)

## 2023-11-30 NOTE — Progress Notes (Signed)
 Complete physical exam  Patient: Shannon Bentley   DOB: 13-Nov-1990   33 y.o. Female  MRN: [id number]  Subjective:    Chief Complaint  Patient presents with   Annual Exam    School form     Shannon Bentley is a 33 y.o. female who presents today for a complete physical exam. She reports consuming a general and low fat diet. Tries to walk one mile per week. She generally feels well. She reports sleeping well. She does not have additional problems to discuss today.    Most recent fall risk assessment:    10/17/2023   12:21 PM  Fall Risk   Falls in the past year? 0  Risk for fall due to : No Fall Risks  Follow up Falls evaluation completed     Most recent depression screenings:    11/30/2023    3:00 PM 10/17/2023   12:21 PM  PHQ 2/9 Scores  PHQ - 2 Score 0 0  PHQ- 9 Score 0 0    Vision:Within last year and Dental: No current dental problems and Receives regular dental care  Patient Active Problem List   Diagnosis Date Noted   Attention or concentration deficit 10/17/2023   Dyshidrotic eczema 10/17/2023   Crohn's disease of both small and large intestine without complication (HCC) 11/18/2021   Crohn's disease (HCC) 08/18/2021   Eczema 08/18/2021   Carpal tunnel syndrome, bilateral 08/18/2021   Abnormal Pap smear of cervix 05/17/2020   Left ovarian cyst 04/13/2020   Abnormal chromosomal and genetic finding on antenatal screening mother 03/29/2020   History of gestational diabetes 03/23/2020   Pre-diabetes 03/17/2020   Vitamin D  deficiency 05/02/2018   Morbid obesity (HCC) 05/02/2018   Past Medical History:  Diagnosis Date   Anemia    Crohn's disease (HCC)    GERD (gastroesophageal reflux disease)    Sleep apnea    Past Surgical History:  Procedure Laterality Date   APPENDECTOMY     BIOPSY  07/03/2017   Procedure: BIOPSY;  Surgeon: Harvey Margo CROME, MD;  Location: [facility];  Service: Endoscopy;;  ileum random colon   COLONOSCOPY  07/2015   Dr. Donnel:  evidence of prior surgical anastomosis, multiple biopsies. 2 small ulcers at anastomosis. TI and remaining colon normal, without evidence of IBD. Path with acute colitis with reactive changes, features of Crohn's not specifically identified. non-specific. differentials including infection, drug effects, self-limited colitis   COLONOSCOPY WITH PROPOFOL  N/A 07/03/2017   Procedure: COLONOSCOPY WITH PROPOFOL ;  Surgeon: Harvey Margo CROME, MD;  Location: [facility];  Service: Endoscopy;  Laterality: N/A;  8:30am   EXPLORATORY LAPAROTOMY  05/2015   Dr. 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 path with crohn's disease involving both ileum and cecum   TONSILLECTOMY     tonsils and adenoids     Social History   Tobacco Use   Smoking status: Former    Current packs/day: 0.00    Average packs/day: 0.3 packs/day for 1 year (0.3 ttl pk-yrs)    Types: E-cigarettes, Cigarettes    Start date: 06/28/2014    Quit date: 06/28/2015    Years since quitting: 8.4   Smokeless tobacco: Never   Tobacco comments:    vaped in past  Vaping Use   Vaping status: Former   Substances: Flavoring  Substance Use Topics   Alcohol use: Not Currently    Comment: rare wine   Drug use: No  Social History   Socioeconomic History   Marital status: Single    Spouse name: Not on file   Number of children: Not on file   Years of education: Not on file   Highest education level: Not on file  Occupational History   Not on file  Tobacco Use   Smoking status: Former    Current packs/day: 0.00    Average packs/day: 0.3 packs/day for 1 year (0.3 ttl pk-yrs)    Types: E-cigarettes, Cigarettes    Start date: 06/28/2014    Quit date: 06/28/2015    Years since quitting: 8.4   Smokeless tobacco: Never   Tobacco comments:    vaped in past  Vaping Use   Vaping status: Former   Substances: Flavoring  Substance and Sexual  Activity   Alcohol use: Not Currently    Comment: rare wine   Drug use: No   Sexual activity: Not Currently  Other Topics Concern   Not on file  Social History Narrative   WORKS AT [employer]. WANTS TO GO TO SCHOOL FOR NURSING NEXT SPRING.   Social Drivers of Corporate investment banker Strain: Low Risk  (10/19/2020)   Overall Financial Resource Strain (CARDIA)    Difficulty of Paying Living Expenses: Not hard at all  Food Insecurity: No Food Insecurity (10/19/2020)   Hunger Vital Sign    Worried About Running Out of Food in the Last Year: Never true    Ran Out of Food in the Last Year: Never true  Transportation Needs: No Transportation Needs (10/19/2020)   PRAPARE - Administrator, Civil Service (Medical): No    Lack of Transportation (Non-Medical): No  Physical Activity: Insufficiently Active (10/19/2020)   Exercise Vital Sign    Days of Exercise per Week: 3 days    Minutes of Exercise per Session: 30 min  Stress: No Stress Concern Present (10/19/2020)   [employer] of Occupational Health - Occupational Stress Questionnaire    Feeling of Stress : Not at all  Social Connections: Moderately Integrated (10/19/2020)   Social Connection and Isolation Panel    Frequency of Communication with Friends and Family: More than three times a week    Frequency of Social Gatherings with Friends and Family: Three times a week    Attends Religious Services: 1 to 4 times per year    Active Member of Clubs or Organizations: Yes    Attends Banker Meetings: 1 to 4 times per year    Marital Status: Never married  Intimate Partner Violence: Not At Risk (10/19/2020)   Humiliation, Afraid, Rape, and Kick questionnaire    Fear of Current or Ex-Partner: No    Emotionally Abused: No    Physically Abused: No    Sexually Abused: No   Family Status  Relation Name Status   MGM  Deceased   MGF  Deceased   Father  Deceased   Mother  Alive   Brother  Alive   Brother  Alive    Sister  Alive   Mat Aunt  Alive   Mat Uncle  Alive   Pat Aunt  Alive   Pat Uncle  Alive   Daughter  Alive   Neg Hx  (Not Specified)  No partnership data on file   Family History  Problem Relation Age of Onset   Heart attack Maternal Grandmother    Diabetes Father    Hypertension Father    Other Father  Covid   Diabetes Mother    Hypertension Mother    Fibromyalgia Sister    Migraines Sister    Other Daughter        seasonal allergies   Eczema Daughter    Colon cancer Neg Hx    Colon polyps Neg Hx    Inflammatory bowel disease Neg Hx    Allergies  Allergen Reactions   Amoxicillin-Pot Clavulanate Hives   Bactrim [Sulfamethoxazole-Trimethoprim] Hives   Benadryl  [Diphenhydramine ] Hives   Dilaudid [Hydromorphone Hcl] Hives      Patient Care Team: Severa Rock HERO, FNP as PCP - General (Family Medicine) Harvey Margo CROME, MD (Inactive) as Consulting Physician (Gastroenterology)   Outpatient Medications Prior to Visit  Medication Sig   Ascorbic Acid (VITAMIN C PO) Take by mouth.   brompheniramine-pseudoephedrine-DM 30-2-10 MG/5ML syrup Take 5 mLs by mouth 4 (four) times daily as needed.   CALCIUM  PO Take by mouth.   cetirizine  (ZYRTEC  ALLERGY) 10 MG tablet Take 1 tablet (10 mg total) by mouth at bedtime.   Cholecalciferol (VITAMIN D3 PO) Take by mouth.   clobetasol  cream (TEMOVATE ) 0.05 % Apply topically 2 (two) times daily.   ELDERBERRY PO Take by mouth.   ipratropium (ATROVENT ) 0.03 % nasal spray Place 2 sprays into both nostrils every 12 (twelve) hours.   loperamide  (IMODIUM  A-D) 2 MG tablet Take 1 tablet (2 mg total) by mouth 4 (four) times daily as needed for diarrhea or loose stools.   Multiple Vitamin (MULTIVITAMIN) tablet Take 1 tablet by mouth daily.   Norethindrone-Ethinyl Estradiol-Fe Biphas (LO LOESTRIN FE ) 1 MG-10 MCG / 10 MCG tablet TAKE 1 TABLET BY MOUTH EVERY DAY   No facility-administered medications prior to visit.    ROS per HPI       Objective:     BP 120/81   Pulse 97   Temp 97.8 F (36.6 C)   Resp 18   Ht 5' 1 (1.549 m)   Wt 274 lb (124.3 kg)   LMP 11/07/2023   SpO2 100%   BMI 51.77 kg/m  BP Readings from Last 3 Encounters:  11/30/23 120/81  10/17/23 104/62  07/30/23 103/66   Wt Readings from Last 3 Encounters:  11/30/23 274 lb (124.3 kg)  10/17/23 275 lb (124.7 kg)  07/30/23 276 lb (125.2 kg)   SpO2 Readings from Last 3 Encounters:  11/30/23 100%  10/17/23 99%  07/30/23 98%     Vision Screening   Right eye Left eye Both eyes  Without correction     With correction 20/20 20/20 20/20      Physical Exam Vitals and nursing note reviewed.  Constitutional:      General: She is not in acute distress.    Appearance: Normal appearance. She is well-developed and well-groomed. She is morbidly obese. She is not ill-appearing, toxic-appearing or diaphoretic.  HENT:     Head: Normocephalic and atraumatic.     Jaw: There is normal jaw occlusion.     Right Ear: Hearing, tympanic membrane, ear canal and external ear normal.     Left Ear: Hearing, tympanic membrane, ear canal and external ear normal.     Nose: Nose normal.     Mouth/Throat:     Lips: Pink.     Mouth: Mucous membranes are moist.     Pharynx: Oropharynx is clear. Uvula midline.  Eyes:     General: Lids are normal.     Extraocular Movements: Extraocular movements intact.     Conjunctiva/sclera: Conjunctivae normal.  Pupils: Pupils are equal, round, and reactive to light.  Neck:     Thyroid : No thyroid  mass, thyromegaly or thyroid  tenderness.     Vascular: No carotid bruit or JVD.     Trachea: Trachea and phonation normal.  Cardiovascular:     Rate and Rhythm: Normal rate and regular rhythm.     Chest Wall: PMI is not displaced.     Pulses: Normal pulses.     Heart sounds: Normal heart sounds. No murmur heard.    No friction rub. No gallop.  Pulmonary:     Effort: Pulmonary effort is normal. No respiratory distress.      Breath sounds: Normal breath sounds. No wheezing.  Abdominal:     General: Bowel sounds are normal. There is no distension or abdominal bruit.     Palpations: Abdomen is soft. There is no hepatomegaly or splenomegaly.     Tenderness: There is no abdominal tenderness. There is no right CVA tenderness or left CVA tenderness.     Hernia: No hernia is present.  Musculoskeletal:        General: Normal range of motion.     Cervical back: Normal range of motion and neck supple.     Right lower leg: No edema.     Left lower leg: No edema.  Lymphadenopathy:     Cervical: No cervical adenopathy.  Skin:    General: Skin is warm and dry.     Capillary Refill: Capillary refill takes less than 2 seconds.     Coloration: Skin is not cyanotic, jaundiced or pale.     Findings: Rash (bilateral feet) present.  Neurological:     General: No focal deficit present.     Mental Status: She is alert and oriented to person, place, and time.     Sensory: Sensation is intact.     Motor: Motor function is intact.     Coordination: Coordination is intact.     Gait: Gait is intact.     Deep Tendon Reflexes: Reflexes are normal and symmetric.  Psychiatric:        Attention and Perception: Attention and perception normal.        Mood and Affect: Mood and affect normal.        Speech: Speech normal.        Behavior: Behavior normal. Behavior is cooperative.        Thought Content: Thought content normal.        Cognition and Memory: Cognition and memory normal.        Judgment: Judgment normal.       Last CBC Lab Results  Component Value Date   WBC 9.0 10/17/2023   HGB 12.8 10/17/2023   HCT 40.9 10/17/2023   MCV 85 10/17/2023   MCH 26.4 (L) 10/17/2023   RDW 13.2 10/17/2023   PLT 370 10/17/2023   Last metabolic panel Lab Results  Component Value Date   GLUCOSE 107 (H) 10/17/2023   NA 139 10/17/2023   K 3.9 10/17/2023   CL 104 10/17/2023   CO2 23 10/17/2023   BUN 11 10/17/2023   CREATININE 0.80  10/17/2023   EGFR 100 10/17/2023   CALCIUM  9.4 10/17/2023   PROT 7.1 10/17/2023   ALBUMIN 4.1 10/17/2023   LABGLOB 3.0 10/17/2023   AGRATIO 1.1 (L) 01/24/2022   BILITOT 0.4 10/17/2023   ALKPHOS 82 10/17/2023   AST 12 10/17/2023   ALT 8 10/17/2023   ANIONGAP 10 07/06/2018   Last lipids Lab  Results  Component Value Date   CHOL 165 10/17/2023   HDL 47 10/17/2023   LDLCALC 91 10/17/2023   TRIG 155 (H) 10/17/2023   CHOLHDL 3.5 10/17/2023   Last hemoglobin A1c Lab Results  Component Value Date   HGBA1C 6.2 (H) 10/17/2023   Last thyroid  functions Lab Results  Component Value Date   TSH 1.640 08/18/2021   T4TOTAL 9.2 08/18/2021   Last vitamin D  Lab Results  Component Value Date   VD25OH 23.3 (L) 10/17/2023   Last vitamin B12 and Folate Lab Results  Component Value Date   VITAMINB12 282 11/14/2017        Assessment & Plan:    Routine Health Maintenance and Physical Exam  Immunization History  Administered Date(s) Administered   Influenza, Seasonal, Injecte, Preservative Fre 10/25/2023   Influenza,inj,Quad PF,6+ Mos 11/29/2016, 10/23/2017, 11/02/2018, 11/18/2021   MenQuadfi_Meningococcal Groups ACYW Conjugate 10/18/2023   PPD Test 10/17/2023   Pneumococcal Polysaccharide-23 10/23/2017   Tdap 06/09/2020    Health Maintenance  Topic Date Due   Hepatitis B Vaccines 19-59 Average Risk (1 of 3 - 19+ 3-dose series) Never done   Cervical Cancer Screening (HPV/Pap Cotest)  05/12/2023   COVID-19 Vaccine (1 - 2025-26 season) 12/16/2023 (Originally 10/22/2023)   HPV VACCINES (1 - 3-dose SCDM series) 10/16/2024 (Originally 09/08/2017)   DTaP/Tdap/Td (2 - Td or Tdap) 06/10/2030   Influenza Vaccine  Completed   Hepatitis C Screening  Completed   HIV Screening  Completed   Pneumococcal Vaccine  Aged Out   Meningococcal B Vaccine  Aged Out    Discussed health benefits of physical activity, and encouraged her to engage in regular exercise appropriate for her age and  condition.  Problem List Items Addressed This Visit       Other   Vitamin D  deficiency   Relevant Orders   CMP14+EGFR   VITAMIN D  25 Hydroxy (Vit-D Deficiency, Fractures)   Other Visit Diagnoses       Annual physical exam    -  Primary   Relevant Orders   CMP14+EGFR   Lipid panel   Anemia Profile B     Encounter for screening for cardiovascular disorders       Relevant Orders   CMP14+EGFR   Lipid panel   Anemia Profile B     Screening for endocrine, nutritional, metabolic and immunity disorder       Relevant Orders   CMP14+EGFR   Lipid panel   TSH + free T4   Bayer DCA Hb A1c Waived   Anemia Profile B         Obesity She has gained one pound since the last visit, currently weighing 274 pounds. Actively trying to manage weight through exercise and dietary modifications, including walking and reducing carbohydrate intake. Expresses a desire to reach a weight of 170 pounds. - Continue current exercise regimen, including one mile walk to lose weight. - Continue dietary modifications, focusing on reducing carbohydrate intake and avoiding fried foods.  Eczema of feet Reports ongoing eczema on her feet, which continues to be bothersome.  Nasal congestion Reports nasal congestion and has been using Afrin. Advised against the use of Afrin due to the risk of rebound congestion. Recommended alternative treatments such as Flonase , Nasacort, or Aslan. - Discontinue Afrin use. - Start Flonase , Nasacort, or Aslan for nasal congestion.  General Health Maintenance Due for a PAP smear and plans to schedule it at [facility]. Discussed the frequency of PAP smears, noting that after age  35, she is required every five years. - Schedule PAP smear at [facility]. - Update lab work.       Return in about 1 year (around 11/29/2024) for Annual Physical.     Rosaline Bruns, FNP

## 2023-12-01 LAB — ANEMIA PROFILE B
Basophils Absolute: 0.1 x10E3/uL (ref 0.0–0.2)
Basos: 1 %
EOS (ABSOLUTE): 0.2 x10E3/uL (ref 0.0–0.4)
Eos: 2 %
Ferritin: 101 ng/mL (ref 15–150)
Folate: 8.8 ng/mL (ref >3.0)
Hematocrit: 40.2 % (ref 34.0–46.6)
Hemoglobin: 12.4 g/dL (ref 11.1–15.9)
Immature Grans (Abs): 0 x10E3/uL (ref 0.0–0.1)
Immature Granulocytes: 0 %
Iron Saturation: 13 % — ABNORMAL LOW (ref 15–55)
Iron: 33 ug/dL (ref 27–159)
Lymphocytes Absolute: 2.4 x10E3/uL (ref 0.7–3.1)
Lymphs: 27 %
MCH: 26.1 pg — ABNORMAL LOW (ref 26.6–33.0)
MCHC: 30.8 g/dL — ABNORMAL LOW (ref 31.5–35.7)
MCV: 85 fL (ref 79–97)
Monocytes Absolute: 0.7 x10E3/uL (ref 0.1–0.9)
Monocytes: 8 %
Neutrophils Absolute: 5.5 x10E3/uL (ref 1.4–7.0)
Neutrophils: 62 %
Platelets: 378 x10E3/uL (ref 150–450)
RBC: 4.75 x10E6/uL (ref 3.77–5.28)
RDW: 13.5 % (ref 11.7–15.4)
Retic Ct Pct: 1.5 % (ref 0.6–2.6)
Total Iron Binding Capacity: 251 ug/dL (ref 250–450)
UIBC: 218 ug/dL (ref 131–425)
Vitamin B-12: 466 pg/mL (ref 232–1245)
WBC: 8.8 x10E3/uL (ref 3.4–10.8)

## 2023-12-01 LAB — CMP14+EGFR
ALT: 9 IU/L (ref 0–32)
AST: 15 IU/L (ref 0–40)
Albumin: 3.9 g/dL (ref 3.9–4.9)
Alkaline Phosphatase: 87 IU/L (ref 41–116)
BUN/Creatinine Ratio: 15 (ref 9–23)
BUN: 11 mg/dL (ref 6–20)
Bilirubin Total: 0.3 mg/dL (ref 0.0–1.2)
CO2: 24 mmol/L (ref 20–29)
Calcium: 9.8 mg/dL (ref 8.7–10.2)
Chloride: 102 mmol/L (ref 96–106)
Creatinine, Ser: 0.75 mg/dL (ref 0.57–1.00)
Globulin, Total: 3 g/dL (ref 1.5–4.5)
Glucose: 100 mg/dL — ABNORMAL HIGH (ref 70–99)
Potassium: 4.2 mmol/L (ref 3.5–5.2)
Sodium: 137 mmol/L (ref 134–144)
Total Protein: 6.9 g/dL (ref 6.0–8.5)
eGFR: 108 mL/min/1.73 (ref >59)

## 2023-12-01 LAB — LIPID PANEL
Chol/HDL Ratio: 3.4 ratio (ref 0.0–4.4)
Cholesterol, Total: 160 mg/dL (ref 100–199)
HDL: 47 mg/dL (ref >39)
LDL Chol Calc (NIH): 88 mg/dL (ref 0–99)
Triglycerides: 142 mg/dL (ref 0–149)
VLDL Cholesterol Cal: 25 mg/dL (ref 5–40)

## 2023-12-01 LAB — VITAMIN D 25 HYDROXY (VIT D DEFICIENCY, FRACTURES): Vit D, 25-Hydroxy: 24.3 ng/mL — ABNORMAL LOW (ref 30.0–100.0)

## 2023-12-01 LAB — TSH+FREE T4
Free T4: 0.9 ng/dL (ref 0.82–1.77)
TSH: 1.26 u[IU]/mL (ref 0.450–4.500)

## 2023-12-29 ENCOUNTER — Other Ambulatory Visit: Payer: Self-pay | Admitting: Adult Health

## 2024-01-12 ENCOUNTER — Other Ambulatory Visit: Payer: Self-pay | Admitting: Family Medicine

## 2024-01-22 ENCOUNTER — Ambulatory Visit: Payer: Self-pay | Admitting: Family Medicine

## 2024-01-22 NOTE — Telephone Encounter (Signed)
 Patient aware

## 2024-01-25 ENCOUNTER — Other Ambulatory Visit: Payer: Self-pay | Admitting: Adult Health

## 2024-03-04 ENCOUNTER — Encounter: Payer: Self-pay | Admitting: Family Medicine

## 2024-03-19 ENCOUNTER — Encounter: Admitting: Family Medicine

## 2024-12-02 ENCOUNTER — Encounter: Payer: Self-pay | Admitting: Family Medicine
